# Patient Record
Sex: Female | Born: 1960
Health system: Southern US, Community
[De-identification: ages and names within clinical notes are randomized; demographics above are authoritative.]

## PROBLEM LIST (undated history)

## (undated) ENCOUNTER — Emergency Department (HOSPITAL_COMMUNITY): Admission: EM | Payer: 59 | Source: Home / Self Care

## (undated) DIAGNOSIS — I10 Essential (primary) hypertension: Secondary | ICD-10-CM

## (undated) DIAGNOSIS — E039 Hypothyroidism, unspecified: Secondary | ICD-10-CM

## (undated) DIAGNOSIS — D649 Anemia, unspecified: Secondary | ICD-10-CM

## (undated) DIAGNOSIS — J45909 Unspecified asthma, uncomplicated: Secondary | ICD-10-CM

## (undated) DIAGNOSIS — I499 Cardiac arrhythmia, unspecified: Secondary | ICD-10-CM

---

## 1989-03-08 HISTORY — PX: BREAST CYST EXCISION: SHX579

## 2001-11-29 ENCOUNTER — Encounter: Payer: Self-pay | Admitting: Obstetrics and Gynecology

## 2001-11-29 ENCOUNTER — Ambulatory Visit (HOSPITAL_COMMUNITY): Admission: RE | Admit: 2001-11-29 | Discharge: 2001-11-29 | Payer: Self-pay | Admitting: *Deleted

## 2002-12-24 ENCOUNTER — Ambulatory Visit (HOSPITAL_COMMUNITY): Admission: RE | Admit: 2002-12-24 | Discharge: 2002-12-24 | Payer: Self-pay | Admitting: Obstetrics and Gynecology

## 2002-12-24 ENCOUNTER — Encounter: Payer: Self-pay | Admitting: Obstetrics and Gynecology

## 2003-12-25 ENCOUNTER — Ambulatory Visit (HOSPITAL_COMMUNITY): Admission: RE | Admit: 2003-12-25 | Discharge: 2003-12-25 | Payer: Self-pay | Admitting: Obstetrics and Gynecology

## 2004-12-31 ENCOUNTER — Ambulatory Visit (HOSPITAL_COMMUNITY): Admission: RE | Admit: 2004-12-31 | Discharge: 2004-12-31 | Payer: Self-pay | Admitting: Obstetrics and Gynecology

## 2006-01-04 ENCOUNTER — Ambulatory Visit (HOSPITAL_COMMUNITY): Admission: RE | Admit: 2006-01-04 | Discharge: 2006-01-04 | Payer: Self-pay | Admitting: Family Medicine

## 2007-01-16 ENCOUNTER — Ambulatory Visit (HOSPITAL_COMMUNITY): Admission: RE | Admit: 2007-01-16 | Discharge: 2007-01-16 | Payer: Self-pay | Admitting: Family Medicine

## 2008-01-17 ENCOUNTER — Ambulatory Visit (HOSPITAL_COMMUNITY): Admission: RE | Admit: 2008-01-17 | Discharge: 2008-01-17 | Payer: Self-pay | Admitting: Family Medicine

## 2009-01-17 ENCOUNTER — Ambulatory Visit (HOSPITAL_COMMUNITY): Admission: RE | Admit: 2009-01-17 | Discharge: 2009-01-17 | Payer: Self-pay | Admitting: Family Medicine

## 2009-01-17 IMAGING — MG MM DIGITAL SCREENING
4 series · 4 of 4 positions shown · non-contrast
Comparison: none

DG SCREEN MAMMOGRAM BILATERAL
Bilateral CC and MLO view(s) were taken.
Technologist: [REDACTED]

DIGITAL SCREENING MAMMOGRAM WITH CAD:
The breast tissue is extremely dense.  A possible mass is noted in the left breast.  Spot 
compression views and possibly sonography are recommended for further evaluation.  In the right 
breast, no masses or malignant type calcifications are identified.  Compared with prior studies.
Images were processed with CAD.

[L CC]
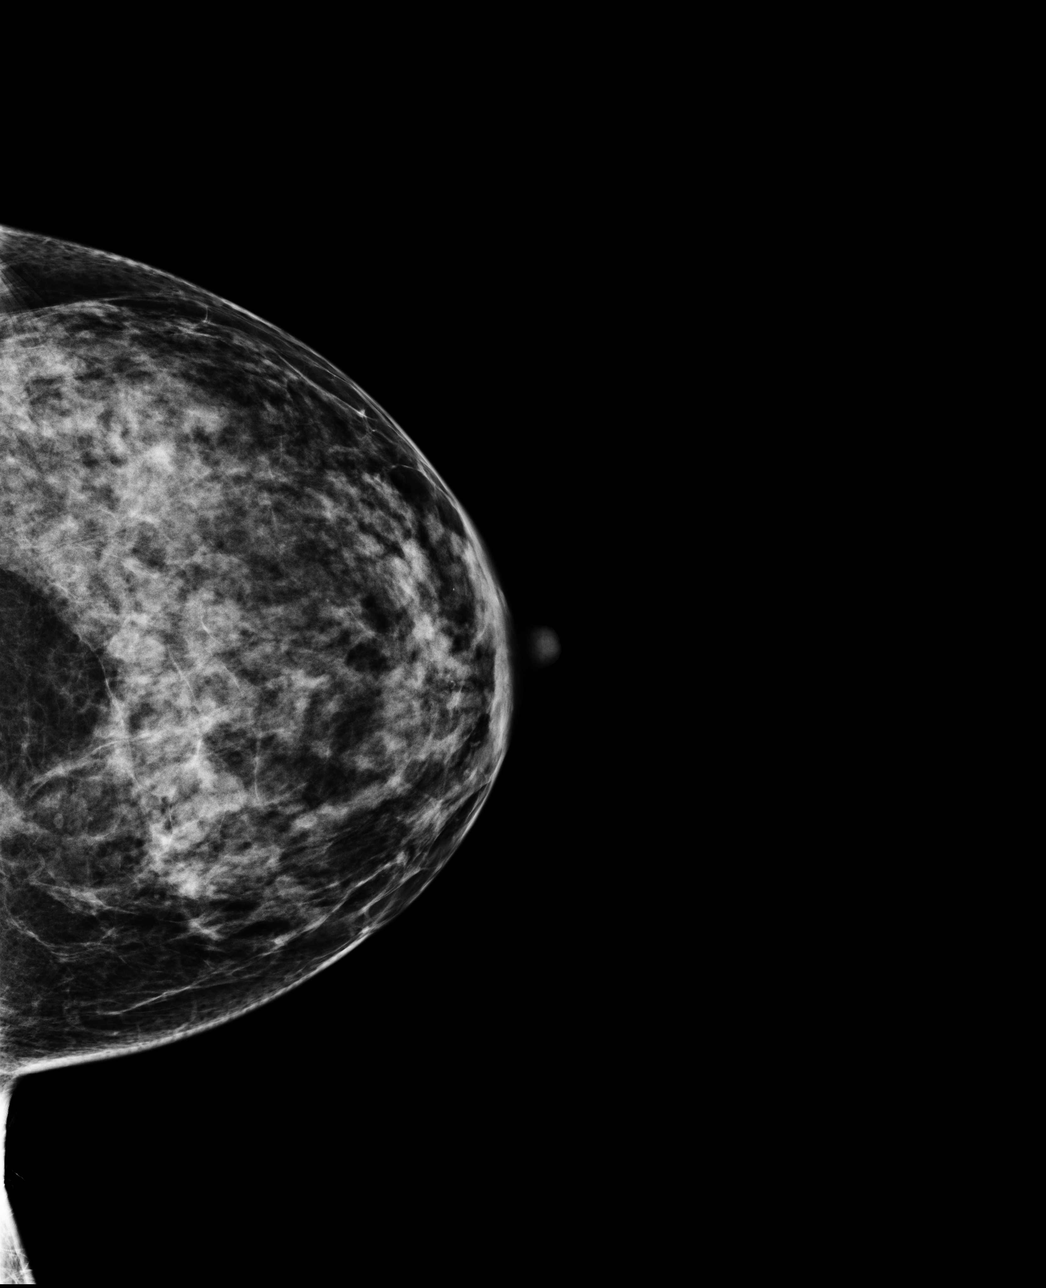

[L MLO]
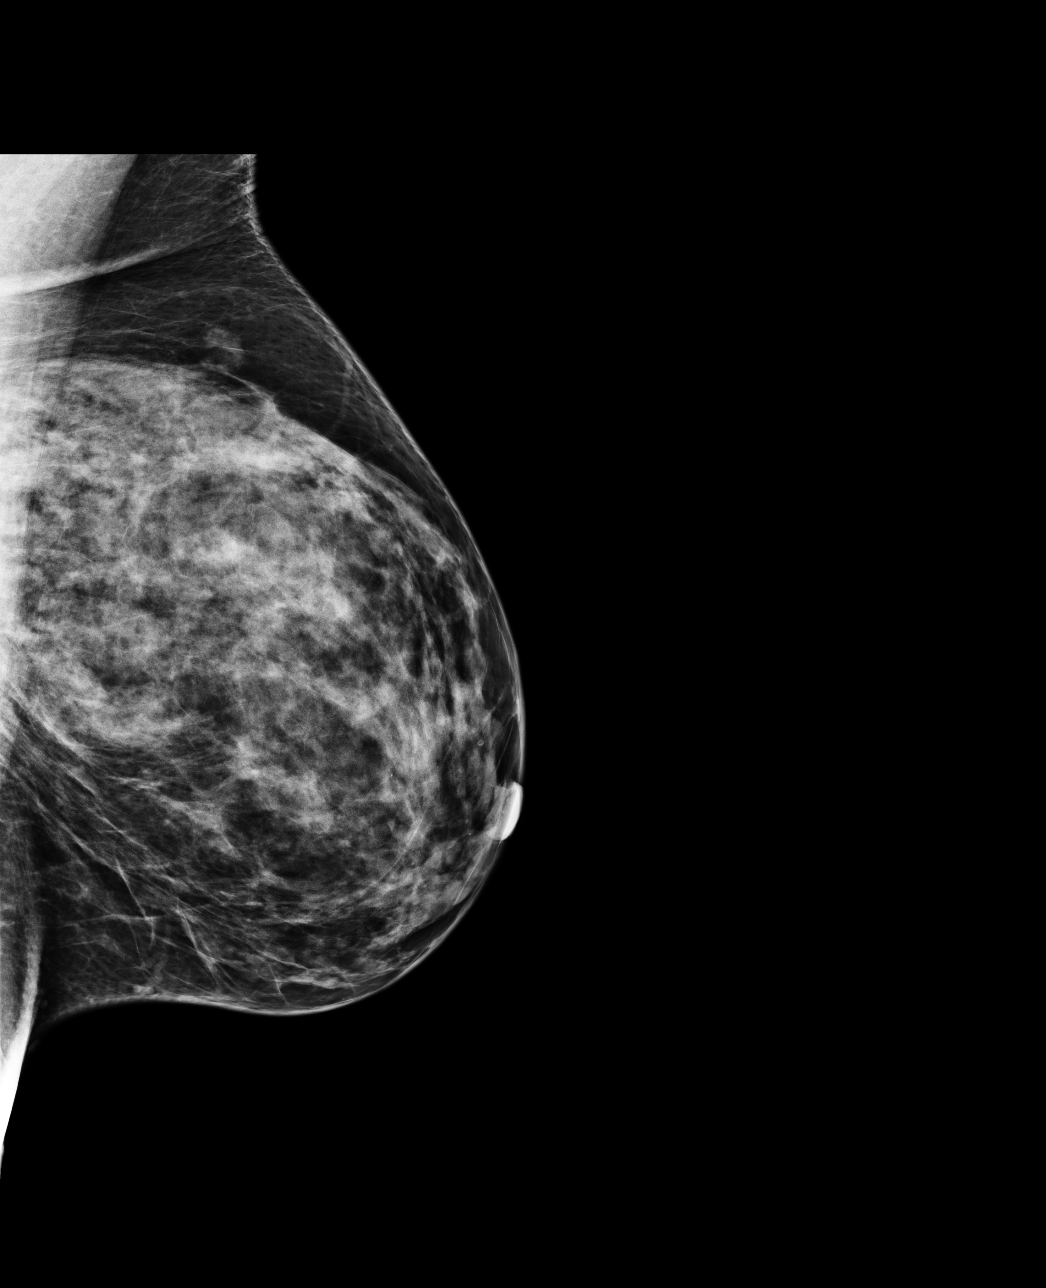

[R CC]
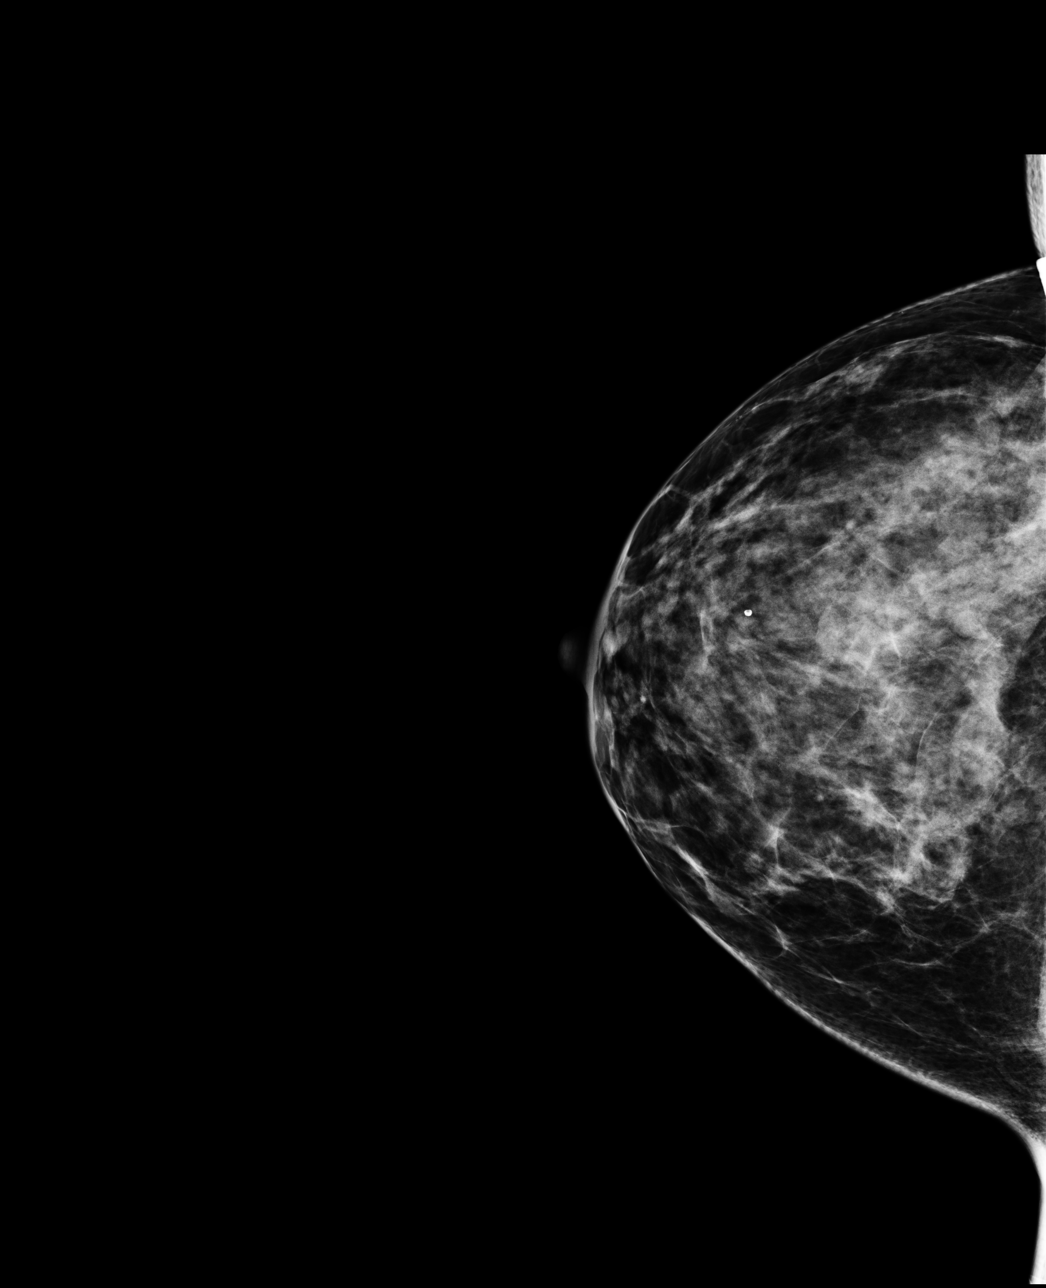

[R MLO]
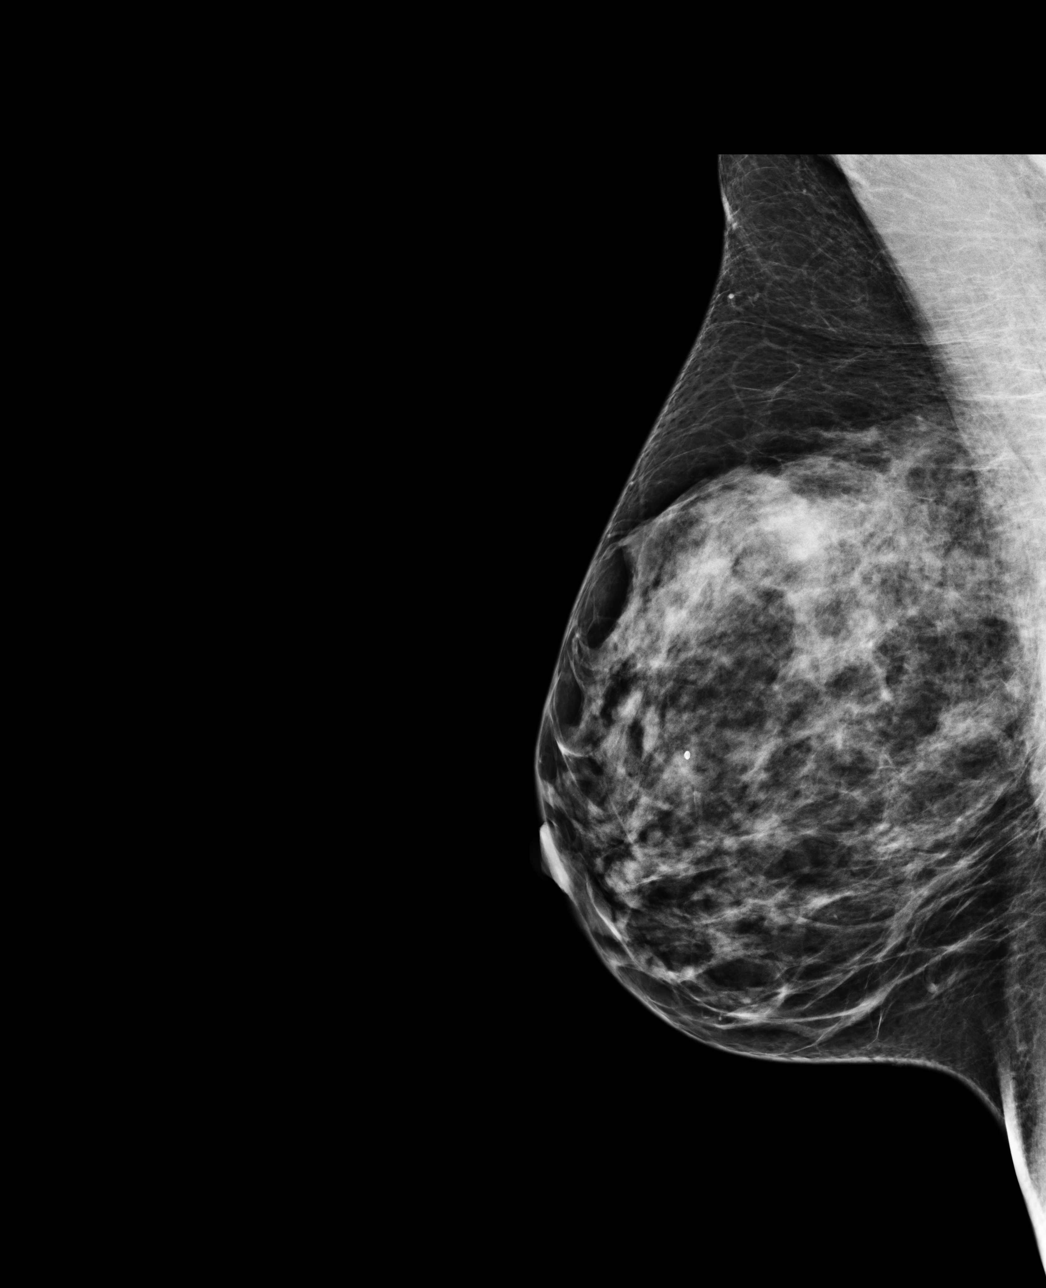

[4 of 4 positions shown; findings below may reference images not displayed]

IMPRESSION: Possible mass, left breast.  Additional evaluation is indicated.  The patient will be contacted for
additional studies and a supplementary report will follow.  No specific mammographic evidence of 
malignancy, right breast.

ASSESSMENT: Need additional imaging evaluation and/or prior mammograms for comparison - BI-RADS 0

Further imaging of the left breast.
,

## 2009-01-23 ENCOUNTER — Ambulatory Visit (HOSPITAL_COMMUNITY): Admission: RE | Admit: 2009-01-23 | Discharge: 2009-01-23 | Payer: Self-pay | Admitting: Family Medicine

## 2009-01-23 IMAGING — MG MM DIGITAL DIAGNOSTIC UNILAT L
3 series · 3 of 3 positions shown · non-contrast
Comparison: [DATE]

CLINICAL DATA: Abnormal screening mammogram

DIGITAL DIAGNOSTIC LEFT MAMMOGRAM

[L CC (1 of 2)]
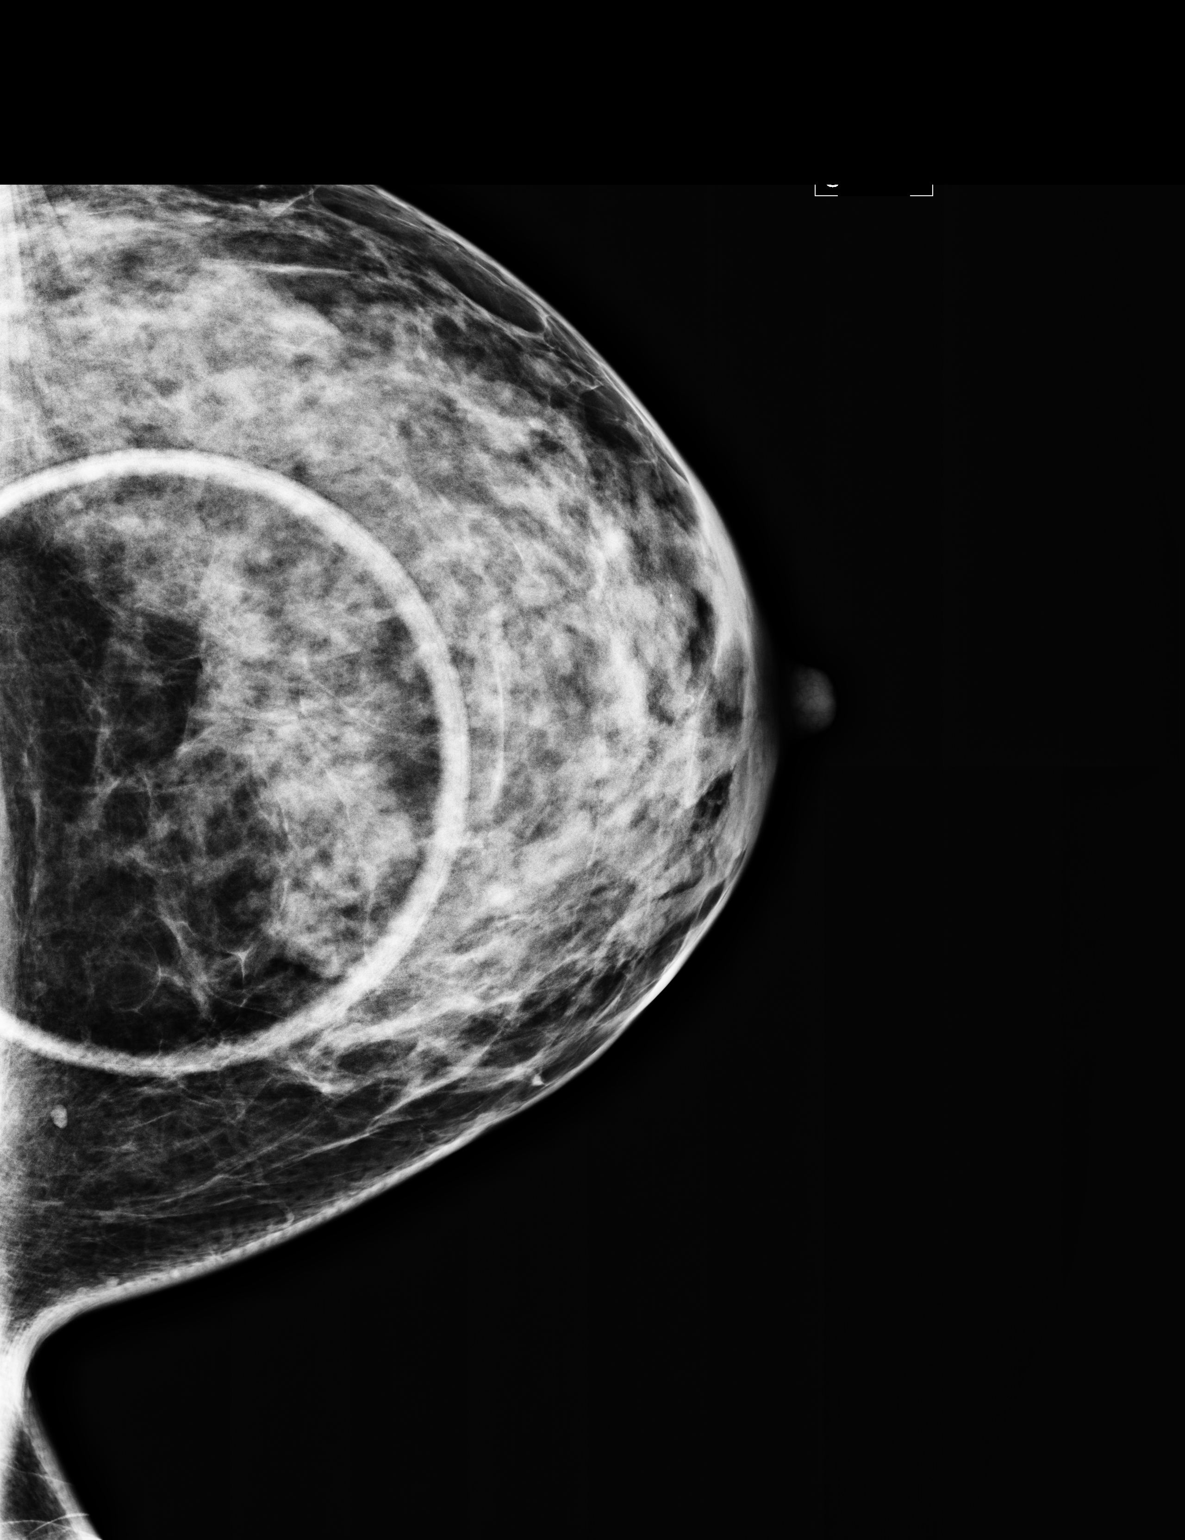

[L MLO]
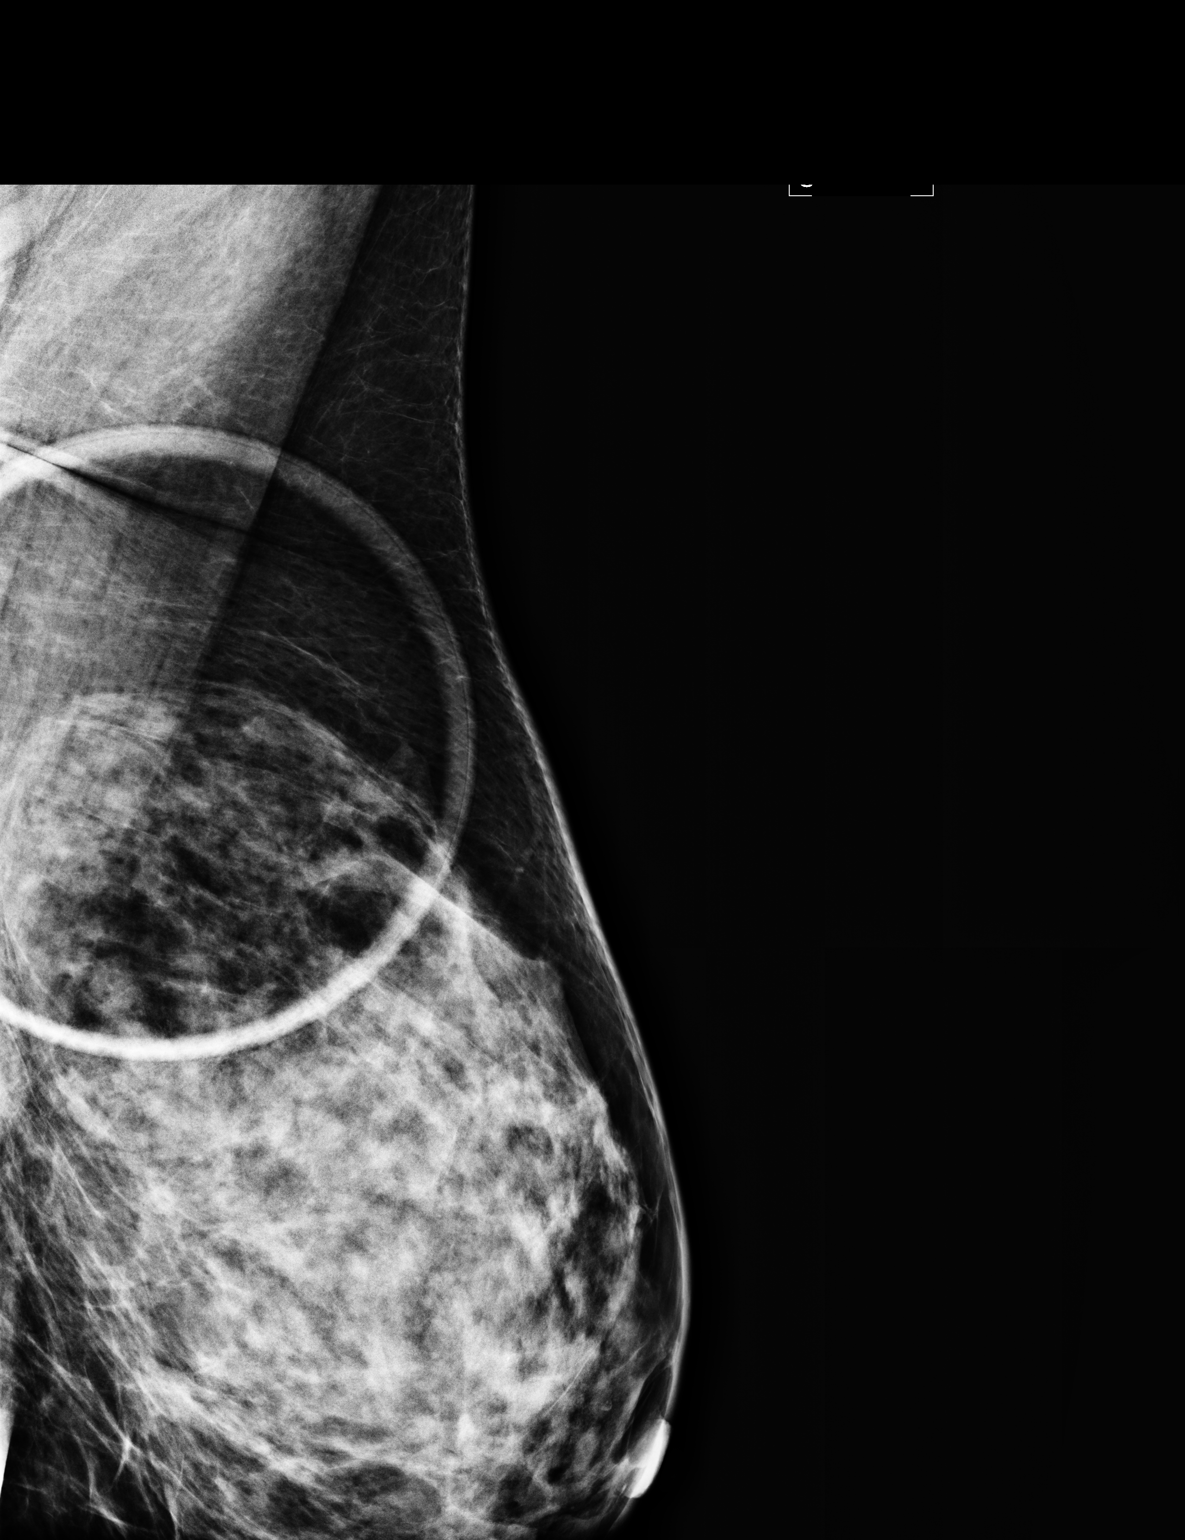

[L CC (2 of 2)]
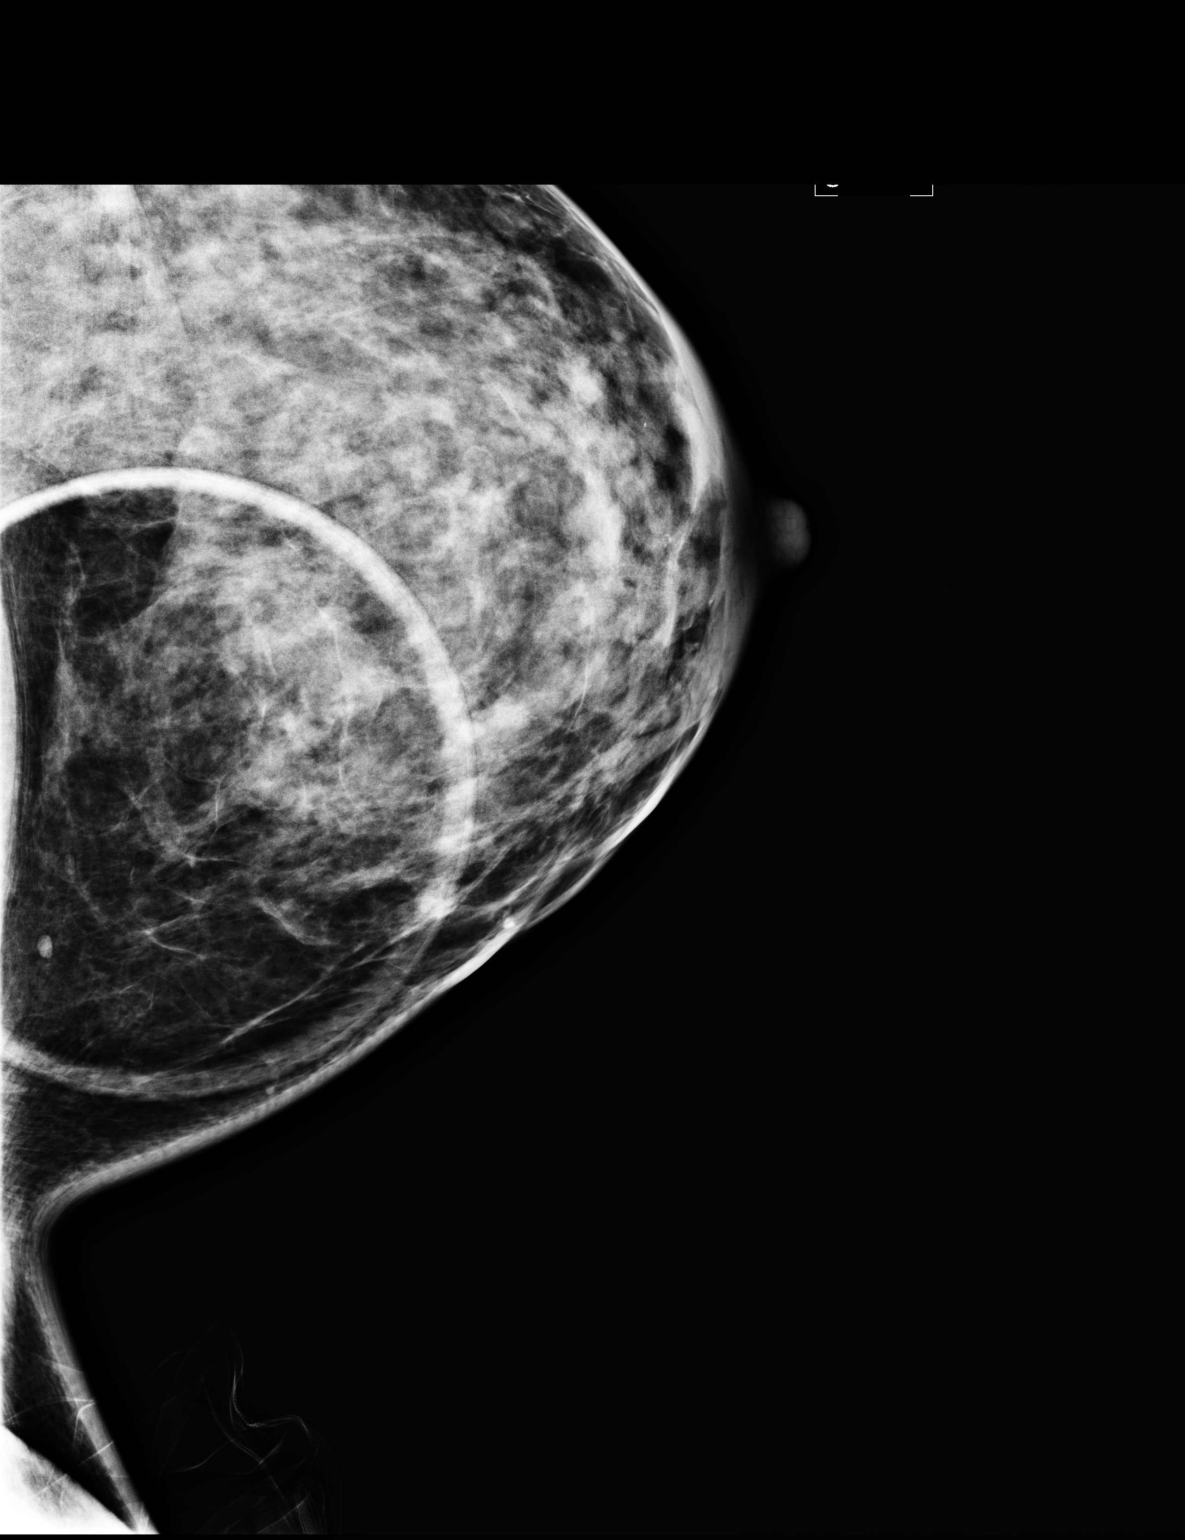

[3 of 3 positions shown; findings below may reference images not displayed]

FINDINGS: Additional views reveal no persistent mass or distortion
within the deep inner left breast.  The dense tissue pattern is
stable.
IMPRESSION: There is no radiographic evidence of malignancy on the left.
Bilateral screening mammogram in 1 year recommended.

BI-RADS CATEGORY 1:  Negative.

## 2010-04-15 ENCOUNTER — Other Ambulatory Visit (HOSPITAL_COMMUNITY): Payer: Self-pay | Admitting: Family Medicine

## 2010-04-15 DIAGNOSIS — Z139 Encounter for screening, unspecified: Secondary | ICD-10-CM

## 2010-04-16 ENCOUNTER — Ambulatory Visit (HOSPITAL_COMMUNITY)
Admission: RE | Admit: 2010-04-16 | Discharge: 2010-04-16 | Disposition: A | Discharge status: Home / Self Care | Payer: 59 | Source: Ambulatory Visit | Attending: Family Medicine | Admitting: Family Medicine

## 2010-04-16 ENCOUNTER — Other Ambulatory Visit (HOSPITAL_COMMUNITY): Payer: Self-pay | Admitting: Family Medicine

## 2010-04-16 DIAGNOSIS — Z1231 Encounter for screening mammogram for malignant neoplasm of breast: Secondary | ICD-10-CM | POA: Insufficient documentation

## 2010-04-16 DIAGNOSIS — N938 Other specified abnormal uterine and vaginal bleeding: Secondary | ICD-10-CM

## 2010-04-16 DIAGNOSIS — Z139 Encounter for screening, unspecified: Secondary | ICD-10-CM

## 2010-04-16 IMAGING — MG MM DIGITAL SCREENING {[facility]}
4 series · 4 of 4 positions shown · non-contrast
Comparison: none

DG SCREEN MAMMOGRAM BILATERAL
Bilateral CC and MLO view(s) were taken.

DIGITAL SCREENING MAMMOGRAM WITH CAD:
The breast tissue is extremely dense.  No masses or malignant type calcifications are identified.  
Compared with prior studies.
Images were processed with CAD.

[L CC]
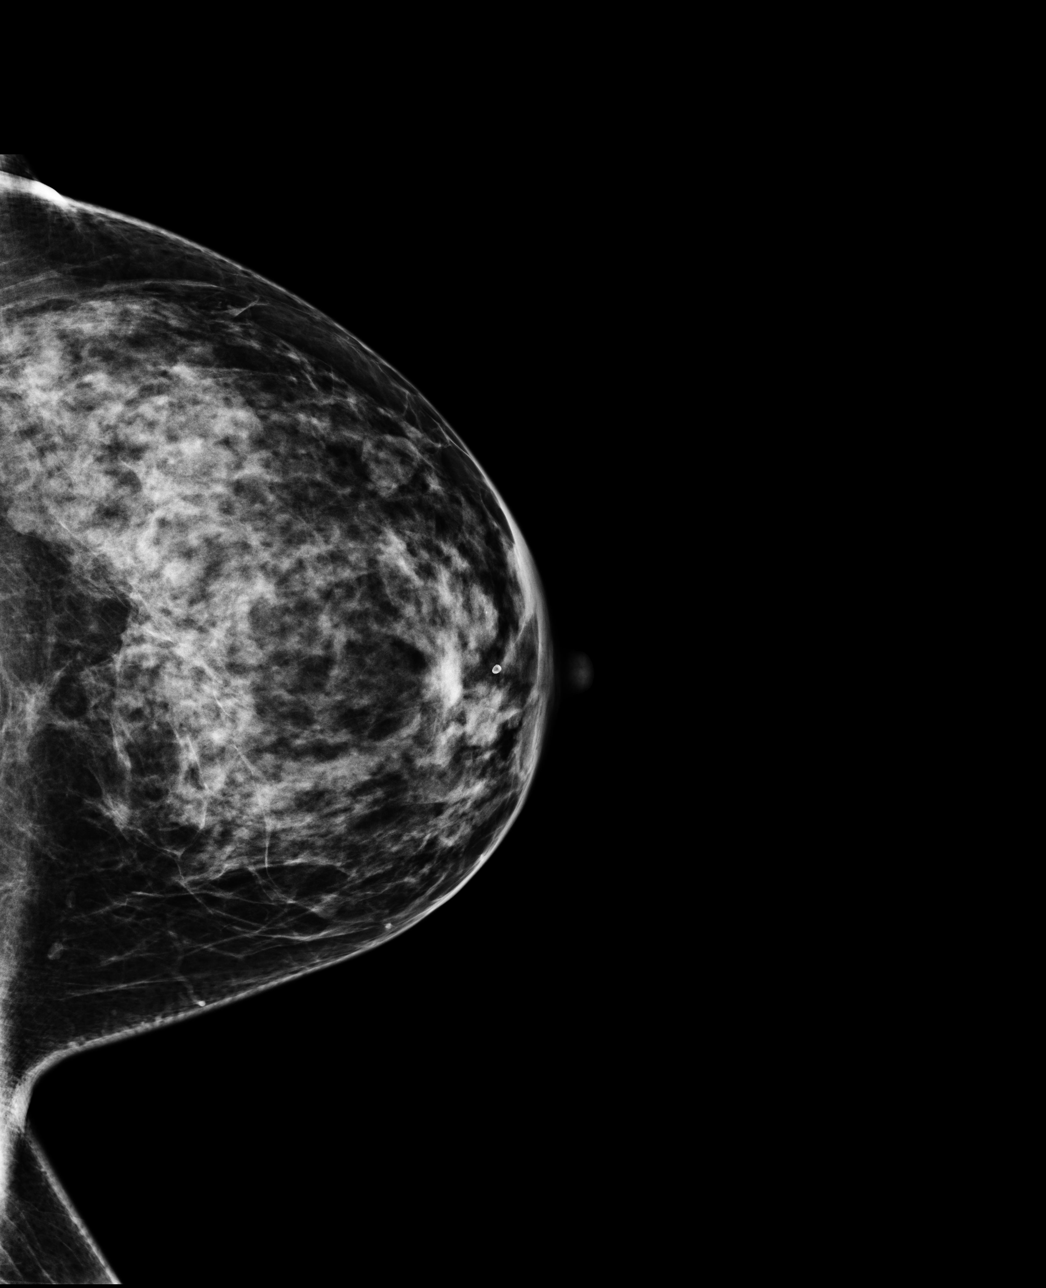

[L MLO]
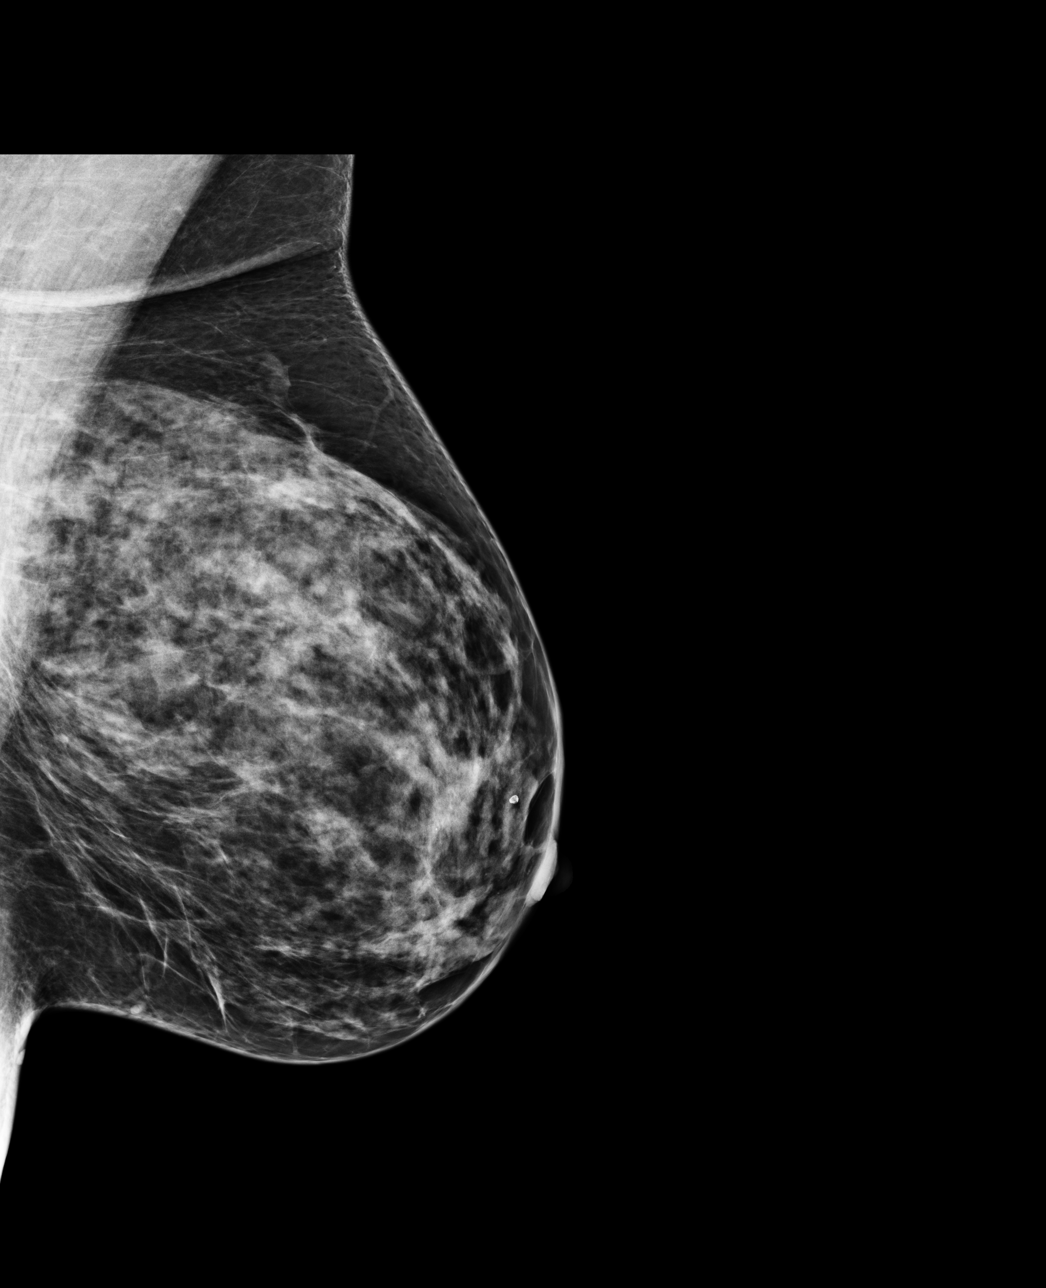

[R CC]
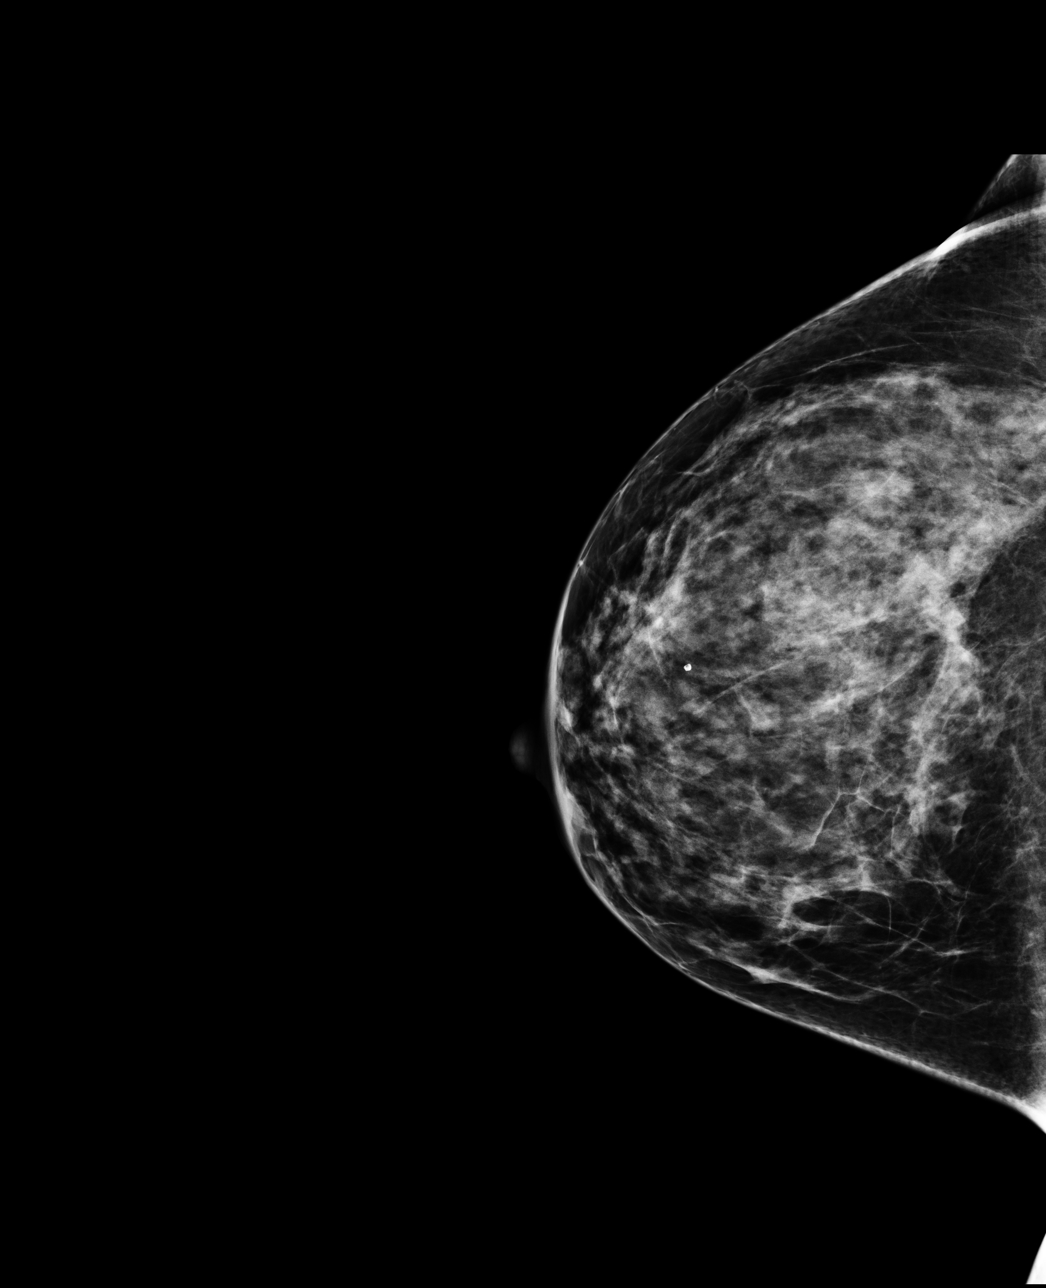

[R MLO]
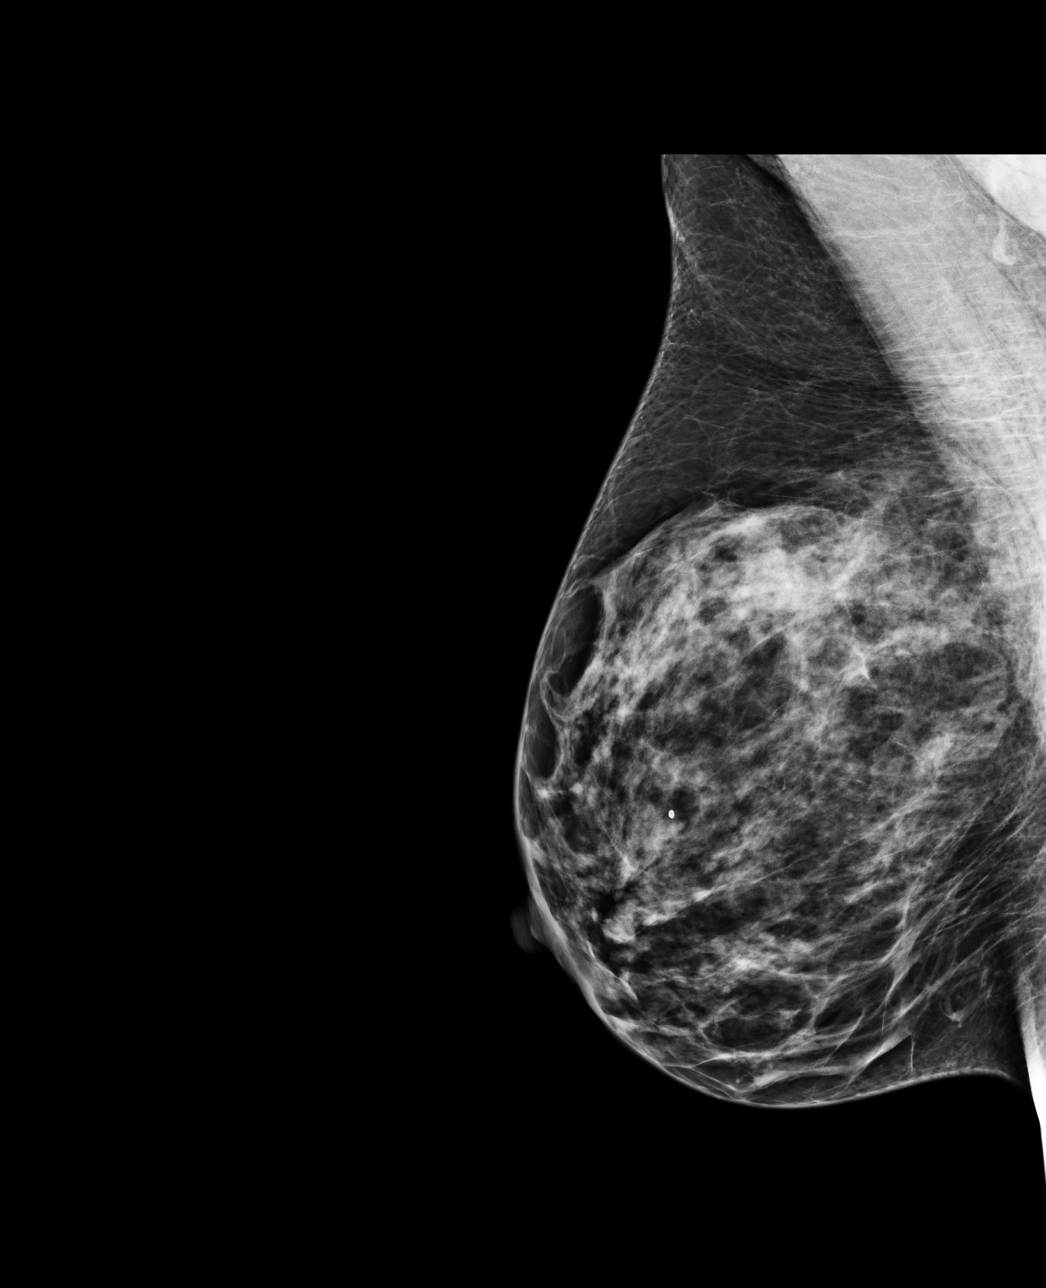

[4 of 4 positions shown; findings below may reference images not displayed]

IMPRESSION: No specific mammographic evidence of malignancy.  Next screening mammogram is recommended in one 
year.

A result letter of this screening mammogram will be mailed directly to the patient.

ASSESSMENT: Negative - BI-RADS 1

Screening mammogram in 1 year.
,

## 2010-04-22 ENCOUNTER — Ambulatory Visit (HOSPITAL_COMMUNITY)
Admission: RE | Admit: 2010-04-22 | Discharge: 2010-04-22 | Disposition: A | Discharge status: Home / Self Care | Payer: 59 | Source: Ambulatory Visit | Attending: Family Medicine | Admitting: Family Medicine

## 2010-04-22 DIAGNOSIS — N949 Unspecified condition associated with female genital organs and menstrual cycle: Secondary | ICD-10-CM | POA: Insufficient documentation

## 2010-04-22 DIAGNOSIS — N72 Inflammatory disease of cervix uteri: Secondary | ICD-10-CM | POA: Insufficient documentation

## 2010-04-22 DIAGNOSIS — N938 Other specified abnormal uterine and vaginal bleeding: Secondary | ICD-10-CM | POA: Insufficient documentation

## 2010-04-22 IMAGING — US US PELV - US TRANSVAGINAL
1 series · 14 of 25 positions shown · non-contrast
Comparison: None.

CLINICAL DATA: History of dysfunctional uterine bleeding.  LMP
[DATE].

TRANSABDOMINAL ULTRASOUND OF PELVIS
TECHNIQUE: Transabdominal ultrasound examination of the pelvis was
performed including evaluation of the uterus, ovaries, adnexal
regions, and pelvic cul-de-sac.

[Series 1: us pelv - us transvaginal · 0.26mm/px · 14 of 62 slices shown]
[im 1/62]
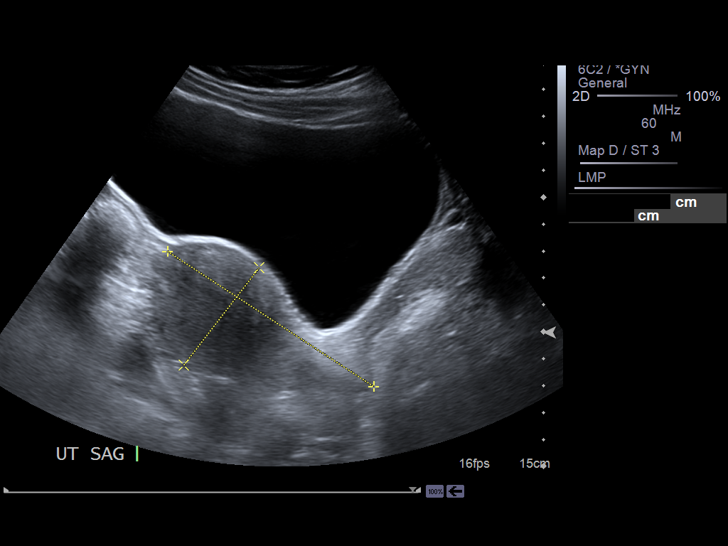
[im 6/62]
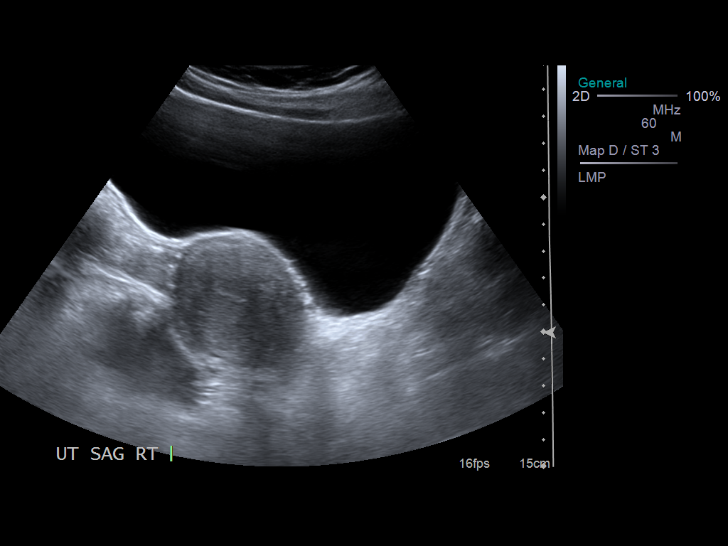
[im 11/62]
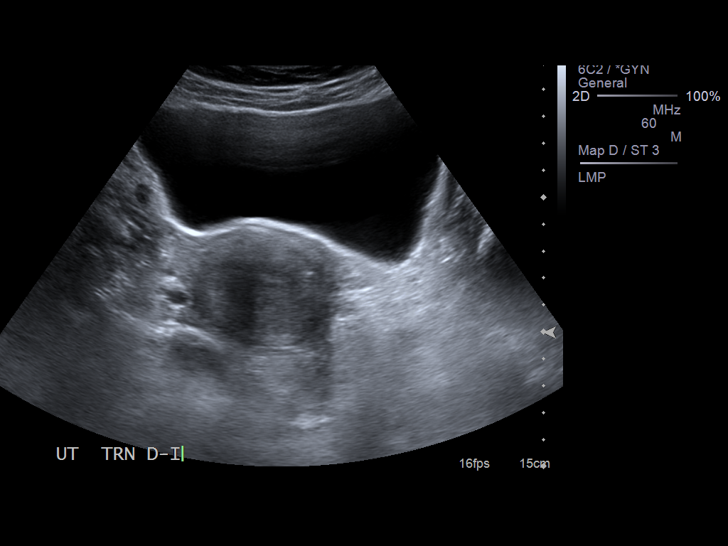
[im 16/62]
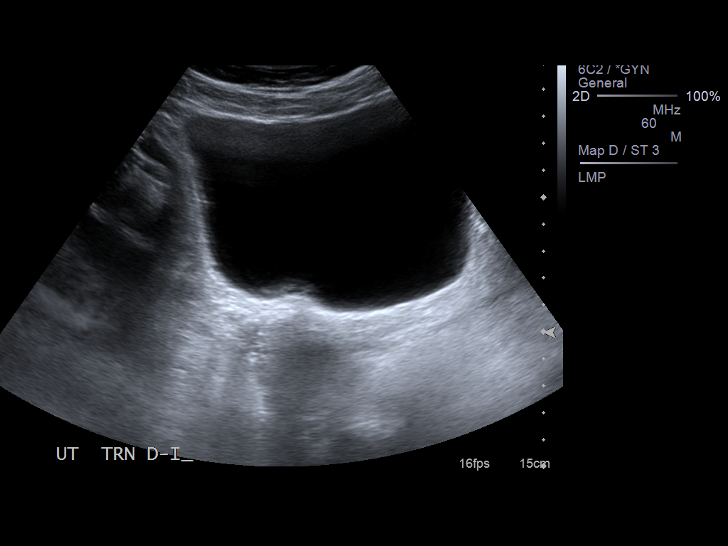
[im 21/62]
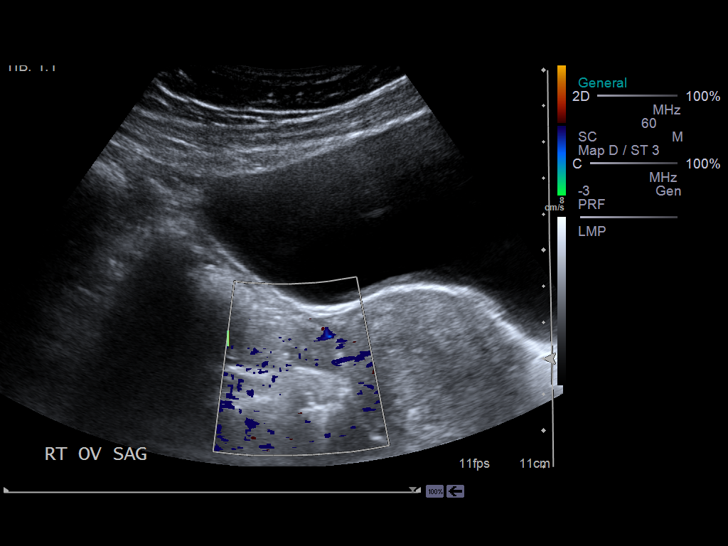
[im 23/62]
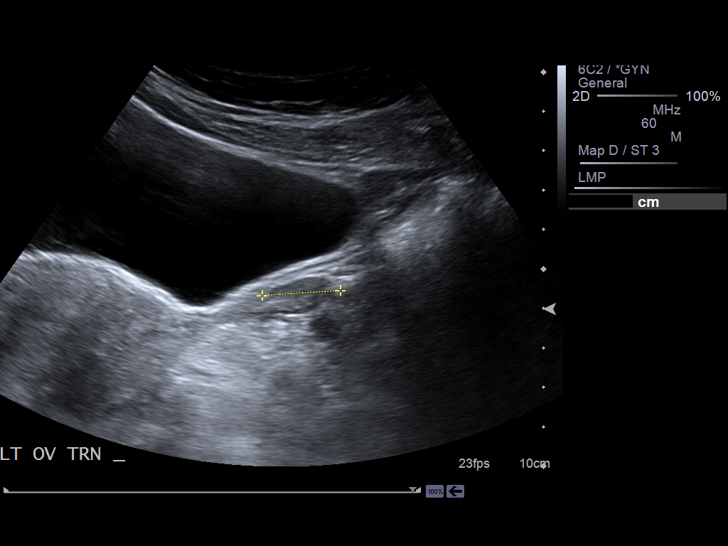
[im 28/62]
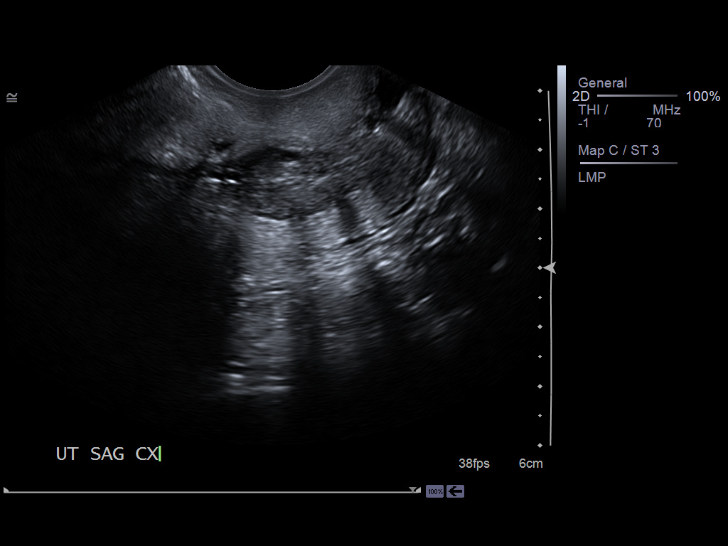
[im 34/62]
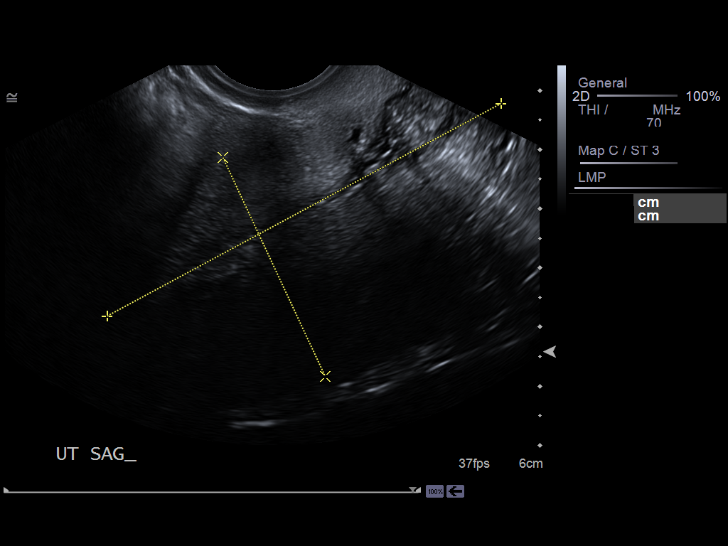
[im 39/62]
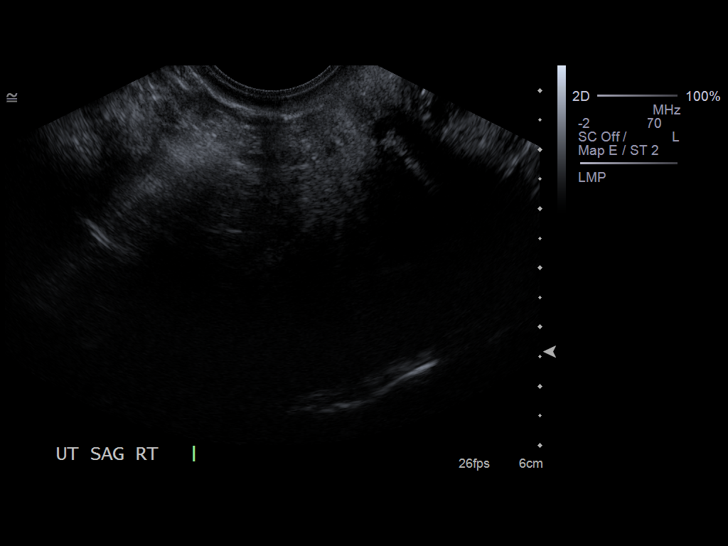
[im 41/62]
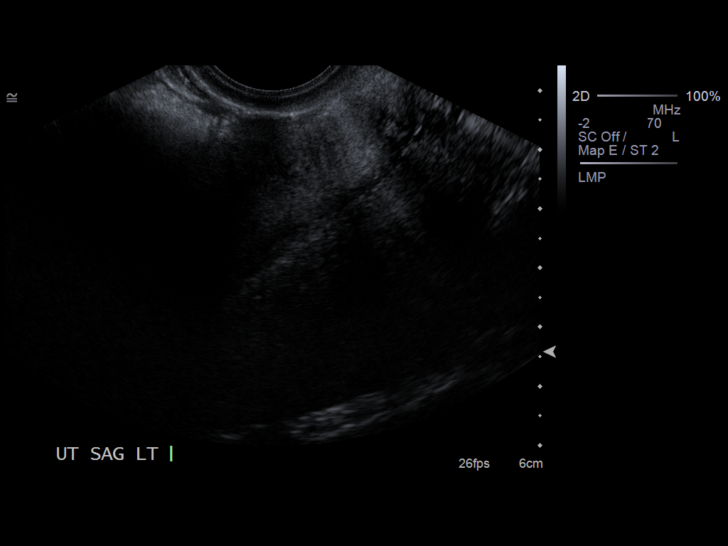
[im 46/62]
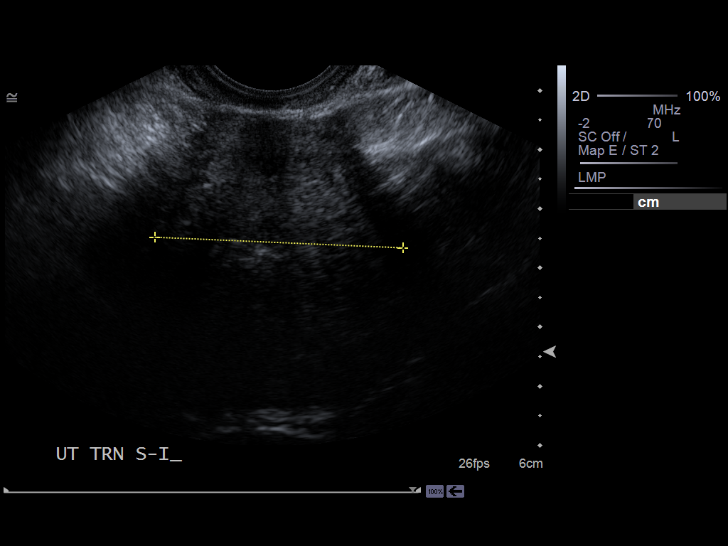
[im 51/62]
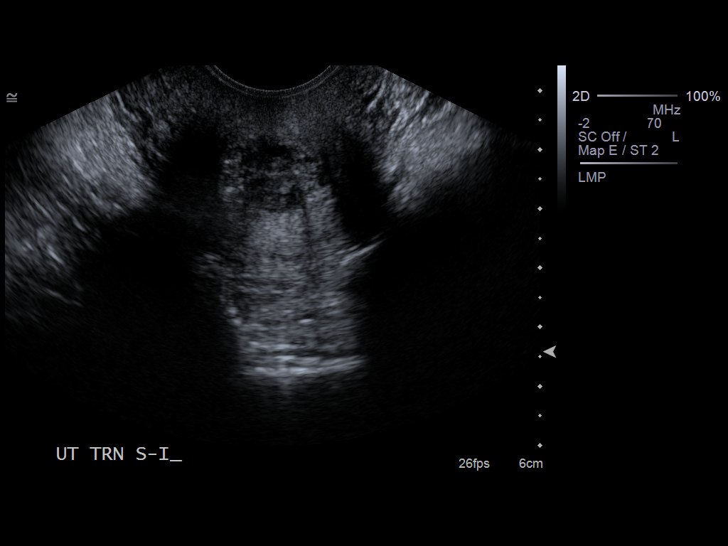
[im 56/62]
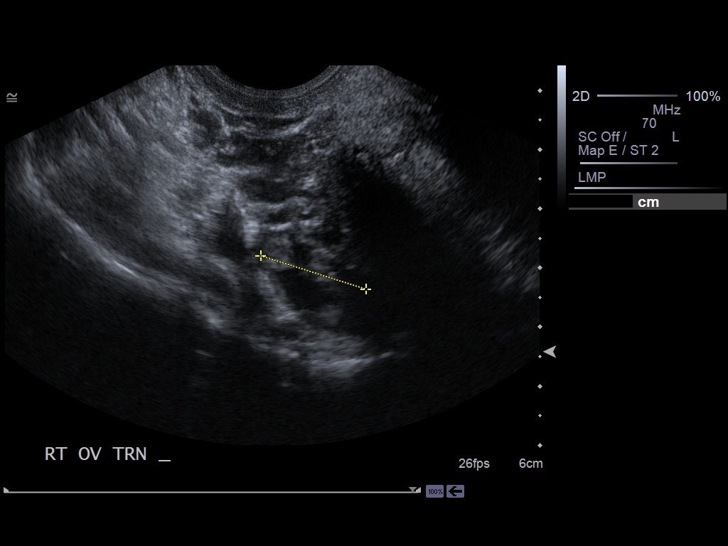
[im 62/62]
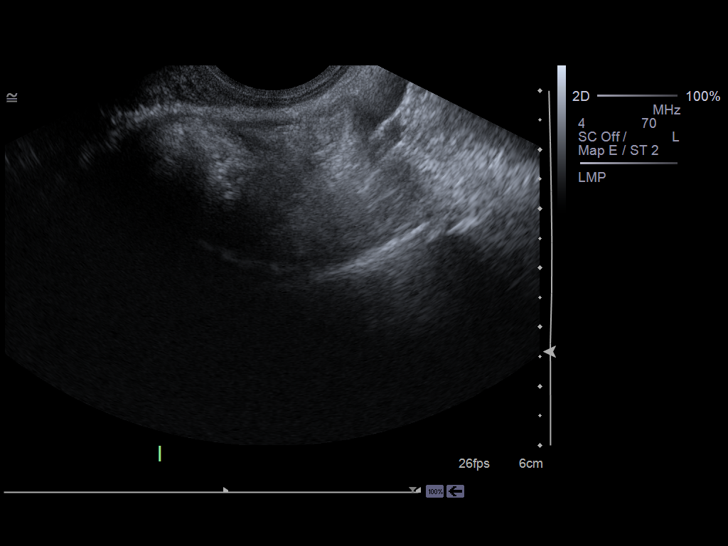

[14 of 25 positions shown; findings below may reference images not displayed]

FINDINGS: Uterus measures  9.2 x 4.6 x 6.1 cm.  There is a Nabothian cyst in
the cervix.  No myometrial abnormality is evident.

Endometrium thickness measures 0.7 cm.  No endometrial mass or
fluid collection or abnormality is evident.

Right Ovary measures 2.4 x 2.7 x 1.6 cm.  No right ovarian lesion
was seen.

Left Ovary measures 2.5 x 1.3 x 2.0 cm.  No ovarian lesion was
seen.

Other Findings:  No free fluid is seen the pelvis.  No tubal
abnormality is evident.
IMPRESSION: Prominent Nabothian cysts in the cervix.  No myometrial or
endometrial abnormality or ovarian lesion is seen.

## 2010-12-31 ENCOUNTER — Telehealth: Payer: Self-pay

## 2010-12-31 ENCOUNTER — Other Ambulatory Visit: Payer: Self-pay

## 2010-12-31 DIAGNOSIS — Z139 Encounter for screening, unspecified: Secondary | ICD-10-CM

## 2010-12-31 NOTE — Telephone Encounter (Signed)
Gastroenterology Pre-Procedure Form  Request Date:   12/31/2010       Requesting Physician: Dr. Lubertha South     PATIENT INFORMATION:  Amy Shelton is a 50 y.o., female (DOB=Jul 23, 1960).  PROCEDURE: Procedure(s) requested: colonoscopy Procedure Reason: screening for colon cancer  PATIENT REVIEW QUESTIONS: The patient reports the following:   1. Diabetes Melitis: no 2. Joint replacements in the past 12 months: no 3. Major health problems in the past 3 months: no 4. Has an artificial valve or MVP:no 5. Has been advised in past to take antibiotics in advance of a procedure like teeth cleaning: no}    MEDICATIONS & ALLERGIES:    Patient reports the following regarding taking any blood thinners:   Plavix? no Aspirin?no Coumadin?  no  Patient confirms/reports the following medications:  Current Outpatient Prescriptions  Medication Sig Dispense Refill  . cholecalciferol (VITAMIN D) 1000 UNITS tablet Take 1,000 Units by mouth daily.        Marland Kitchen levothyroxine (SYNTHROID, LEVOTHROID) 50 MCG tablet Take 50 mcg by mouth daily.        . Multiple Vitamin (MULTIVITAMIN) tablet Take 1 tablet by mouth daily. WITH IRON AND CALCIUM       . NON FORMULARY Iron    28mg  daily       . NON FORMULARY CALCIUM 1200 MG PLUS D         Patient confirms/reports the following allergies:  No Known Allergies  Patient is appropriate to schedule for requested procedure(s): yes  AUTHORIZATION INFORMATION Primary Insurance:   ID #:   Group #:  Pre-Cert / Auth required:  Pre-Cert / Auth #:   Secondary Insurance: ID #:   Group #:  Pre-Cert / Auth required:  Pre-Cert / Auth #:   No orders of the defined types were placed in this encounter.    SCHEDULE INFORMATION: Procedure has been scheduled as follows:  Date: 02/05/2011       Time: 9:30 AM  Location: [facility] Short Stay  This Gastroenterology Pre-Precedure Form is being routed to the following provider(s) for review: Jonette Eva, MD

## 2011-01-04 NOTE — Telephone Encounter (Signed)
MOVIPREP SPLIT PREP OR PREPOPIK SAMPLE SPLIT DOSING

## 2011-01-08 NOTE — Telephone Encounter (Signed)
Rx and instructions mailed.  

## 2011-01-20 ENCOUNTER — Telehealth: Payer: Self-pay

## 2011-01-20 NOTE — Telephone Encounter (Signed)
LMOM for pt to call and update info prior to colonoscopy on 02/05/2011.

## 2011-01-20 NOTE — Telephone Encounter (Signed)
Pt returned call to update triage.  Gastroenterology Pre-Procedure Form  Request Date: 01/20/2011     Requesting Physician: Simone Curia     PATIENT INFORMATION:  Amy Shelton is a 50 y.o., female (DOB=11/27/1960).  PROCEDURE: Procedure(s) requested: colonoscopy Procedure Reason: screening for colon cancer  PATIENT REVIEW QUESTIONS: The patient reports the following:   1. Diabetes Melitis: no 2. Joint replacements in the past 12 months: no 3. Major health problems in the past 3 months: no 4. Has an artificial valve or MVP:no 5. Has been advised in past to take antibiotics in advance of a procedure like teeth cleaning: no}    MEDICATIONS & ALLERGIES:    Patient reports the following regarding taking any blood thinners:   Plavix? no Aspirin?no Coumadin?  no  Patient confirms/reports the following medications:  Current Outpatient Prescriptions  Medication Sig Dispense Refill  . cholecalciferol (VITAMIN D) 1000 UNITS tablet Take 1,000 Units by mouth daily.        Marland Kitchen levothyroxine (SYNTHROID, LEVOTHROID) 50 MCG tablet Take 50 mcg by mouth daily.        . Multiple Vitamin (MULTIVITAMIN) tablet Take 1 tablet by mouth daily. WITH IRON AND CALCIUM       . NON FORMULARY Iron    28mg  daily       . NON FORMULARY CALCIUM 1200 MG PLUS D         Patient confirms/reports the following allergies:  No Known Allergies  Patient is appropriate to schedule for requested procedure(s): yes  AUTHORIZATION INFORMATION Primary Insurance: ID #: Group #:  Pre-Cert / Auth required Pre-Cert / Auth #:   Secondary Insurance:   ID #: Group #:  Pre-Cert / Auth required: Pre-Cert / Auth #:   No orders of the defined types were placed in this encounter.    SCHEDULE INFORMATION: Procedure has been scheduled as follows:  Date: 02/05/2011      Time: 9:30 AM  Location: [facility] Short Stay  This Gastroenterology Pre-Precedure Form is being routed to the following provider(s) for review:  Jonette Eva, MD

## 2011-01-22 ENCOUNTER — Encounter (HOSPITAL_COMMUNITY): Payer: Self-pay | Admitting: Pharmacy Technician

## 2011-01-25 NOTE — Telephone Encounter (Signed)
MOVI-PREP SPLIT DOSING. PT MAY HAVE HER USUAL BREAKFAST. CLEAR LIQUIDS AFTER 9 AM.

## 2011-01-26 NOTE — Telephone Encounter (Signed)
Informed pt she can have breakfast and start clear liquids at 9:00 AM.

## 2011-02-04 MED ORDER — SODIUM CHLORIDE 0.45 % IV SOLN
Freq: Once | INTRAVENOUS | Status: AC
  Administered 2011-02-05: 09:00:00 via INTRAVENOUS

## 2011-02-05 ENCOUNTER — Encounter (HOSPITAL_COMMUNITY): Payer: Self-pay | Admitting: *Deleted

## 2011-02-05 ENCOUNTER — Encounter (HOSPITAL_COMMUNITY)
Admission: RE | Disposition: A | Discharge status: Home / Self Care | Payer: Self-pay | Source: Ambulatory Visit | Attending: Gastroenterology

## 2011-02-05 ENCOUNTER — Ambulatory Visit (HOSPITAL_COMMUNITY)
Admission: RE | Admit: 2011-02-05 | Discharge: 2011-02-05 | Disposition: A | Discharge status: Home / Self Care | Payer: 59 | Source: Ambulatory Visit | Attending: Gastroenterology | Admitting: Gastroenterology

## 2011-02-05 DIAGNOSIS — Z139 Encounter for screening, unspecified: Secondary | ICD-10-CM

## 2011-02-05 DIAGNOSIS — K648 Other hemorrhoids: Secondary | ICD-10-CM

## 2011-02-05 DIAGNOSIS — Z1211 Encounter for screening for malignant neoplasm of colon: Secondary | ICD-10-CM

## 2011-02-05 HISTORY — DX: Hypothyroidism, unspecified: E03.9

## 2011-02-05 HISTORY — PX: COLONOSCOPY: SHX5424

## 2011-02-05 HISTORY — DX: Anemia, unspecified: D64.9

## 2011-02-05 SURGERY — COLONOSCOPY
Anesthesia: Moderate Sedation

## 2011-02-05 MED ORDER — MEPERIDINE HCL 100 MG/ML IJ SOLN
INTRAMUSCULAR | Status: DC | PRN
  Administered 2011-02-05: 50 mg via INTRAVENOUS
  Administered 2011-02-05: 25 mg via INTRAVENOUS

## 2011-02-05 MED ORDER — MIDAZOLAM HCL 5 MG/5ML IJ SOLN
INTRAMUSCULAR | Status: DC | PRN
  Administered 2011-02-05: 2 mg via INTRAVENOUS
  Administered 2011-02-05: 1 mg via INTRAVENOUS
  Administered 2011-02-05: 2 mg via INTRAVENOUS

## 2011-02-05 MED ORDER — MIDAZOLAM HCL 5 MG/5ML IJ SOLN
INTRAMUSCULAR | Status: AC
  Filled 2011-02-05: qty 10

## 2011-02-05 MED ORDER — STERILE WATER FOR IRRIGATION IR SOLN
Status: DC | PRN
  Administered 2011-02-05: 10:00:00

## 2011-02-05 MED ORDER — MEPERIDINE HCL 100 MG/ML IJ SOLN
INTRAMUSCULAR | Status: AC
  Filled 2011-02-05: qty 2

## 2011-02-05 NOTE — H&P (Signed)
  Primary Care Physician:  Harlow Asa, MD, MD Primary Gastroenterologist:  Dr. Darrick Penna  Pre-Procedure History & Physical: HPI:  Amy Shelton is a 50 y.o. female here for COLON CANCER SCREENING.   Past Medical History  Diagnosis Date  . Anemia     related to heavy vaginal bleeding-has resolved  . Hypothyroidism     History reviewed. No pertinent past surgical history.  Prior to Admission medications   Medication Sig Start Date End Date Taking? Authorizing Provider  cholecalciferol (VITAMIN D) 1000 UNITS tablet Take 1,000 Units by mouth daily.     Yes Historical Provider, MD  levothyroxine (SYNTHROID, LEVOTHROID) 50 MCG tablet Take 50 mcg by mouth daily.     Yes Historical Provider, MD  NON FORMULARY CALCIUM 1200 MG PLUS D    Yes Historical Provider, MD  Multiple Vitamin (MULTIVITAMIN) tablet Take 1 tablet by mouth daily. WITH IRON AND VITAMIN D    Historical Provider, MD  NON FORMULARY Iron    28mg  daily     Historical Provider, MD    Allergies as of 12/31/2010  . (No Known Allergies)    Family History  Problem Relation Age of Onset  . Colon cancer Maternal Aunt   NO POLYPS  History   Social History  . Marital Status: Married    Spouse Name: N/A    Number of Children: N/A  . Years of Education: N/A   Occupational History  . Not on file.   Social History Main Topics  . Smoking status: Never Smoker   . Smokeless tobacco: Not on file  . Alcohol Use: 4.2 oz/week    7 Glasses of wine per week  . Drug Use: No  . Sexually Active:    Other Topics Concern  . Not on file   Social History Narrative  . No narrative on file    Review of Systems: See HPI, otherwise negative ROS   Physical Exam: BP 153/76  Pulse 50  Temp(Src) 97.7 F (36.5 C) (Oral)  Resp 16  Ht 5' 4.5" (1.638 m)  Wt 155 lb (70.308 kg)  BMI 26.19 kg/m2  SpO2 97%  LMP 01/05/2011 General:   Alert,  pleasant and cooperative in NAD Head:  Normocephalic and atraumatic. Neck:  Supple; no masses  or thyromegaly. Lungs:  Clear throughout to auscultation.    Heart:  Regular rate and rhythm. Abdomen:  Soft, nontender and nondistended. Normal bowel sounds, without guarding, and without rebound.   Neurologic:  Alert and  oriented x4;  grossly normal neurologically.  Impression/Plan:    AVERAGE RISK  PLAN: TCS TODAY

## 2011-02-15 ENCOUNTER — Encounter (HOSPITAL_COMMUNITY): Payer: Self-pay | Admitting: Gastroenterology

## 2011-05-05 ENCOUNTER — Other Ambulatory Visit: Payer: Self-pay | Admitting: Family Medicine

## 2011-05-05 DIAGNOSIS — Z139 Encounter for screening, unspecified: Secondary | ICD-10-CM

## 2011-05-07 ENCOUNTER — Ambulatory Visit (HOSPITAL_COMMUNITY)
Admission: RE | Admit: 2011-05-07 | Discharge: 2011-05-07 | Disposition: A | Discharge status: Home / Self Care | Payer: 59 | Source: Ambulatory Visit | Attending: Family Medicine | Admitting: Family Medicine

## 2011-05-07 DIAGNOSIS — Z139 Encounter for screening, unspecified: Secondary | ICD-10-CM

## 2011-05-07 DIAGNOSIS — Z1231 Encounter for screening mammogram for malignant neoplasm of breast: Secondary | ICD-10-CM | POA: Insufficient documentation

## 2011-05-07 IMAGING — MG MM DIGITAL SCREENING BILAT
3 series · 3 of 3 positions shown · non-contrast
Comparison: none

DG SCREEN MAMMOGRAM BILATERAL
Bilateral CC and MLO view(s) were taken.

DIGITAL SCREENING MAMMOGRAM WITH CAD:
The breast tissue is heterogeneously dense.  No masses or malignant type calcifications are 
identified.  Compared with prior studies.
Images were processed with CAD.

[L CC]
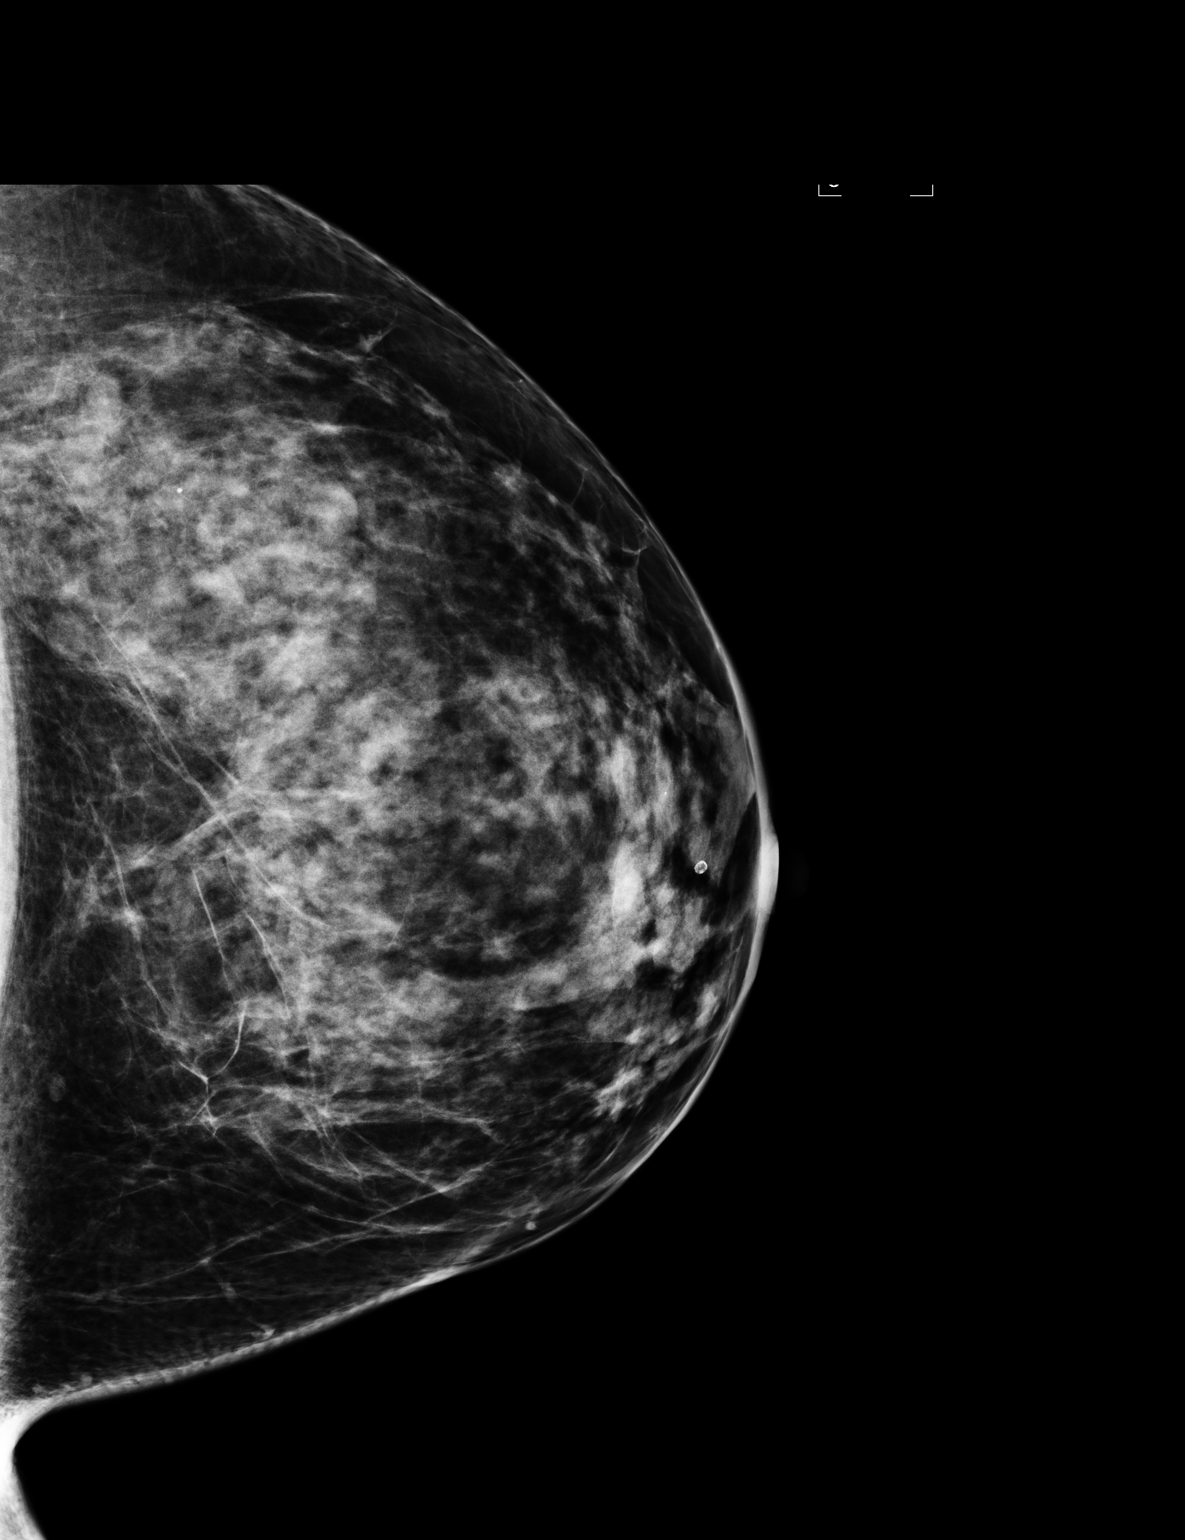

[L MLO]
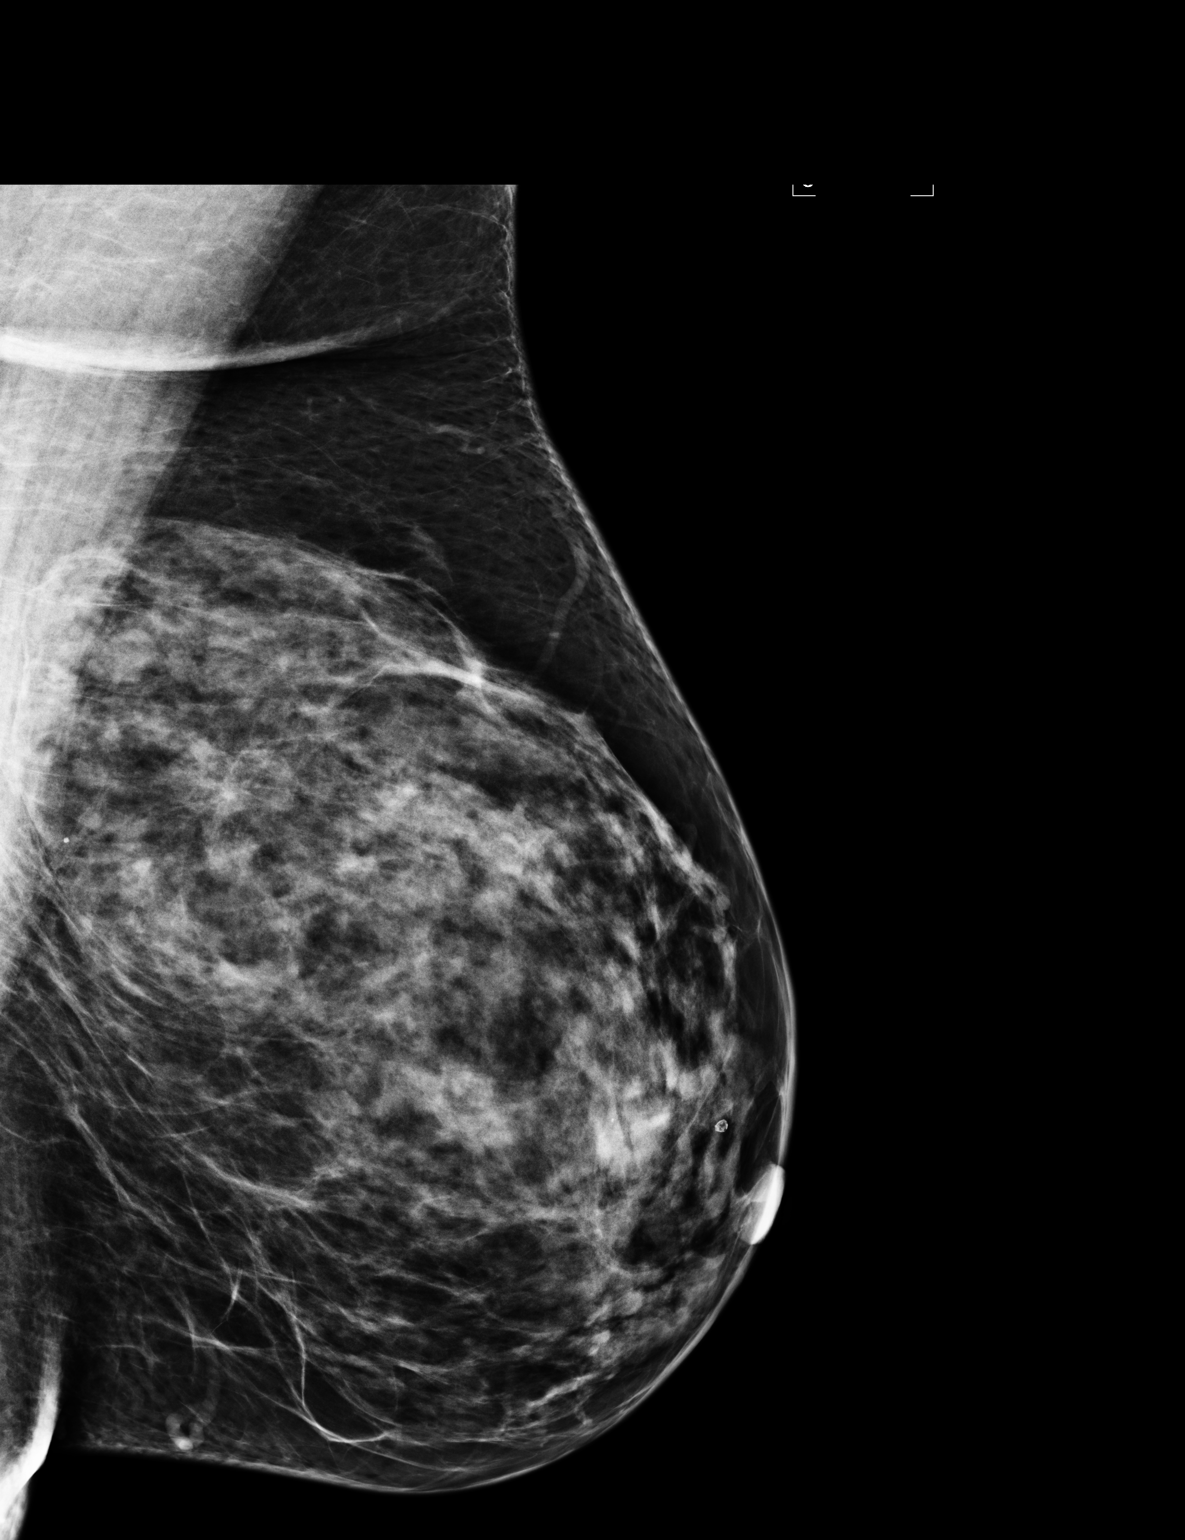

[R CC]
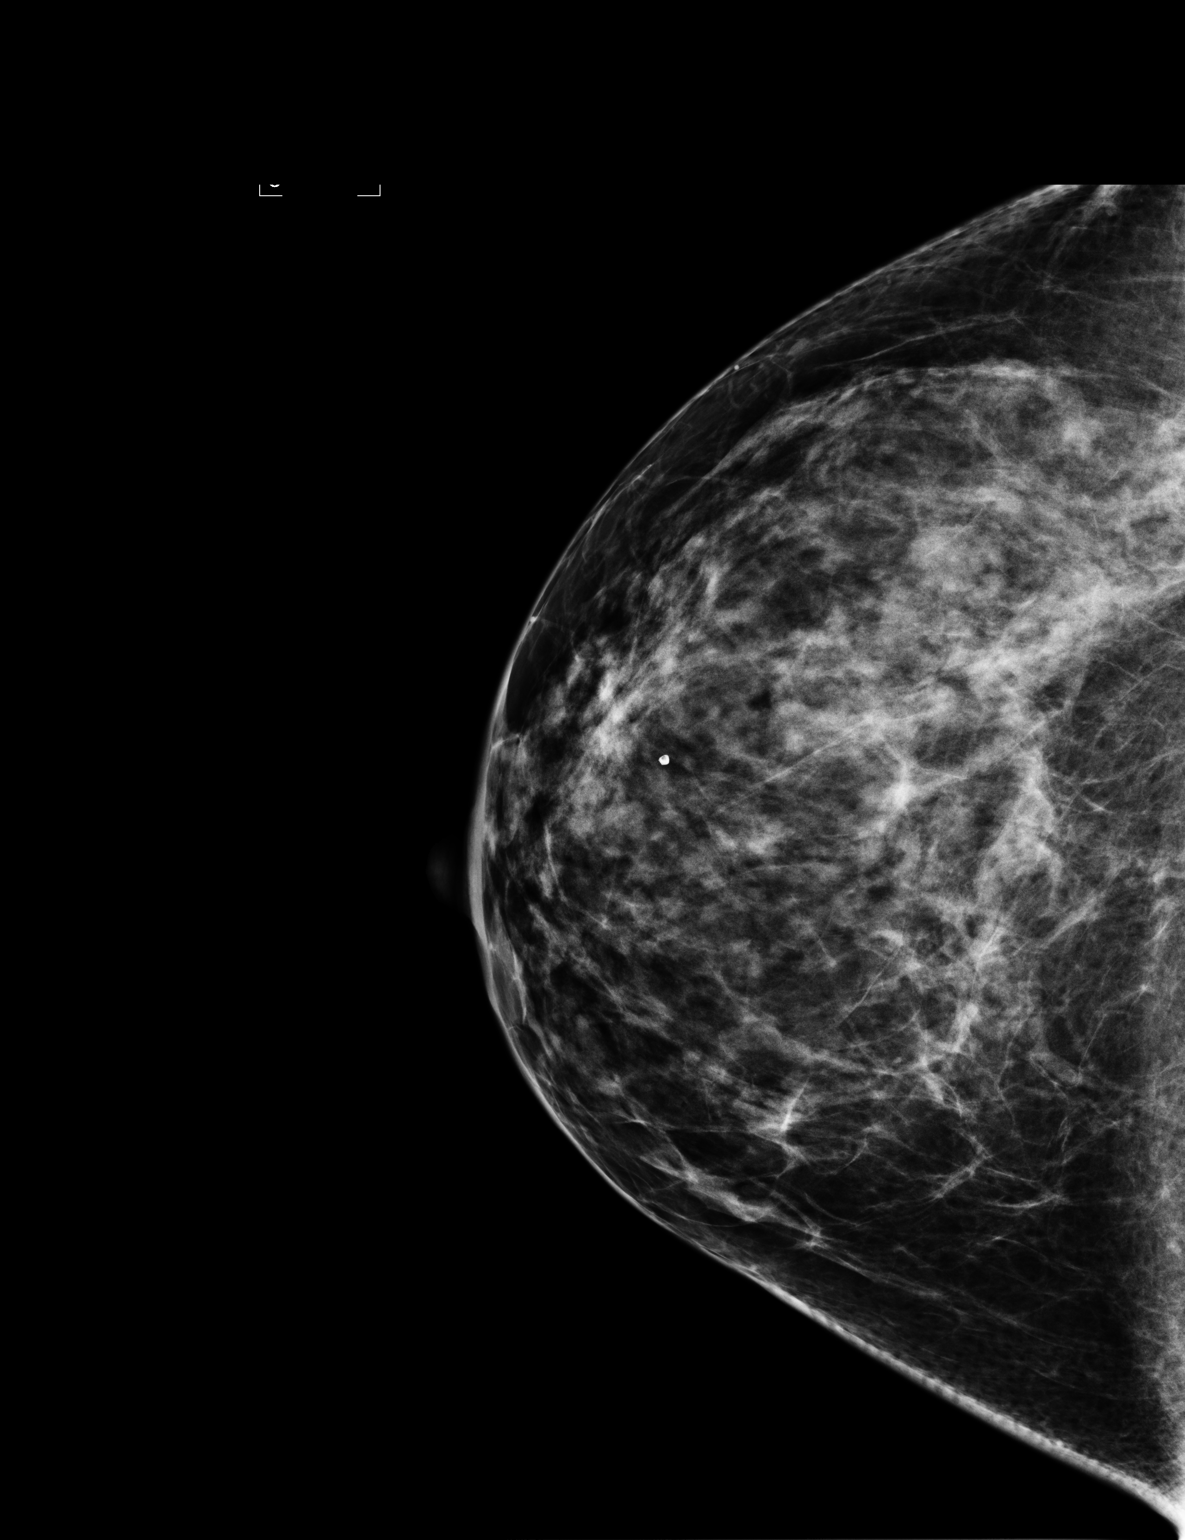

[3 of 3 positions shown; findings below may reference images not displayed]

IMPRESSION: No specific mammographic evidence of malignancy.  Next screening mammogram is recommended in one 
year.

A result letter of this screening mammogram will be mailed directly to the patient.

ASSESSMENT: Negative - BI-RADS 1

Screening mammogram in 1 year.
,

## 2012-06-26 ENCOUNTER — Other Ambulatory Visit: Payer: Self-pay | Admitting: Nurse Practitioner

## 2012-06-26 DIAGNOSIS — Z139 Encounter for screening, unspecified: Secondary | ICD-10-CM

## 2012-06-26 DIAGNOSIS — E039 Hypothyroidism, unspecified: Secondary | ICD-10-CM

## 2012-06-26 DIAGNOSIS — Z13220 Encounter for screening for lipoid disorders: Secondary | ICD-10-CM

## 2012-06-27 ENCOUNTER — Ambulatory Visit (HOSPITAL_COMMUNITY)
Admission: RE | Admit: 2012-06-27 | Discharge: 2012-06-27 | Disposition: A | Discharge status: Home / Self Care | Payer: 59 | Source: Ambulatory Visit | Attending: Nurse Practitioner | Admitting: Nurse Practitioner

## 2012-06-27 DIAGNOSIS — Z1231 Encounter for screening mammogram for malignant neoplasm of breast: Secondary | ICD-10-CM | POA: Insufficient documentation

## 2012-06-27 LAB — LIPID PANEL
Cholesterol: 247 mg/dL — ABNORMAL HIGH (ref 0–200)
HDL: 74 mg/dL (ref >39)
LDL Cholesterol: 152 mg/dL — ABNORMAL HIGH (ref 0–99)
Total CHOL/HDL Ratio: 3.3 Ratio
Triglycerides: 103 mg/dL (ref <150)
VLDL: 21 mg/dL (ref 0–40)

## 2012-06-27 IMAGING — MG MM DIGITAL SCREENING
5 series · 5 of 5 positions shown · non-contrast
Comparison: Previous exams.

CLINICAL DATA: Screening.

DIGITAL BILATERAL SCREENING MAMMOGRAM WITH CAD

[L CC]
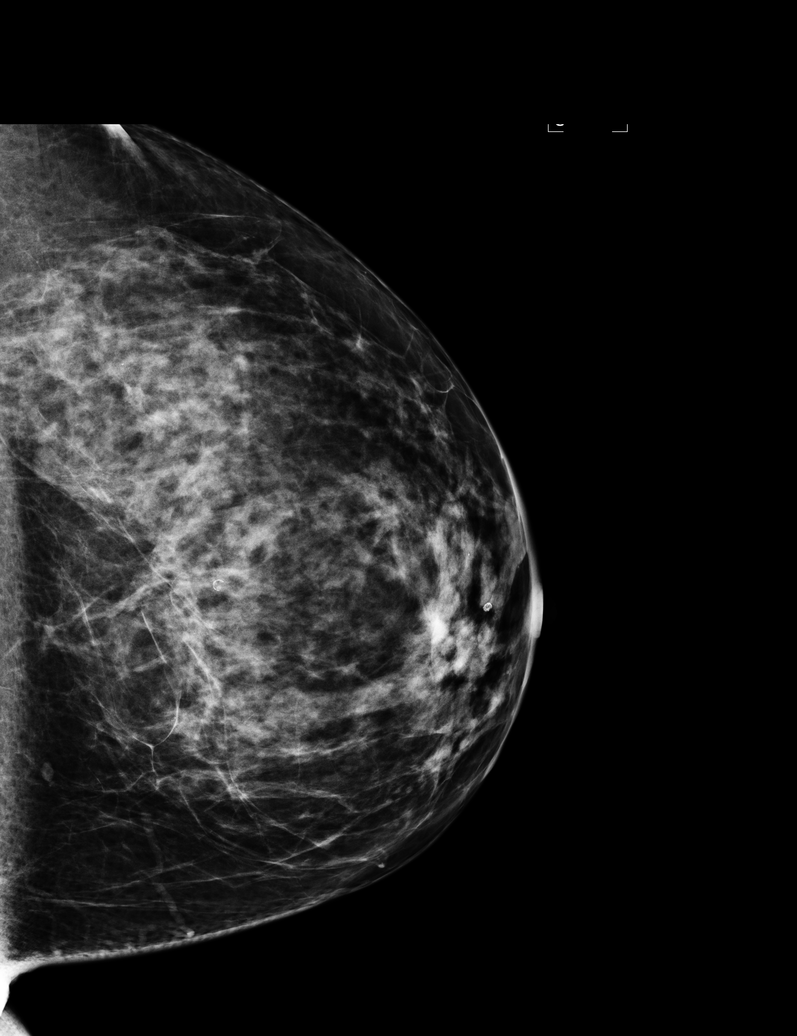

[L MLO]
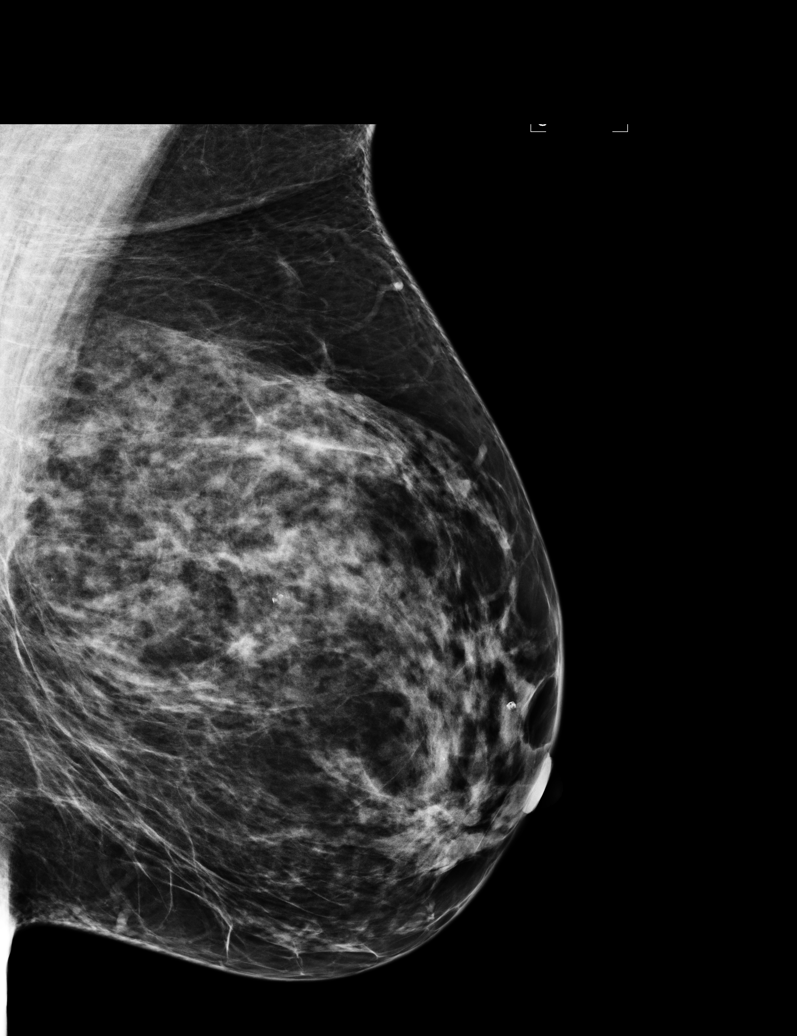

[R CC]
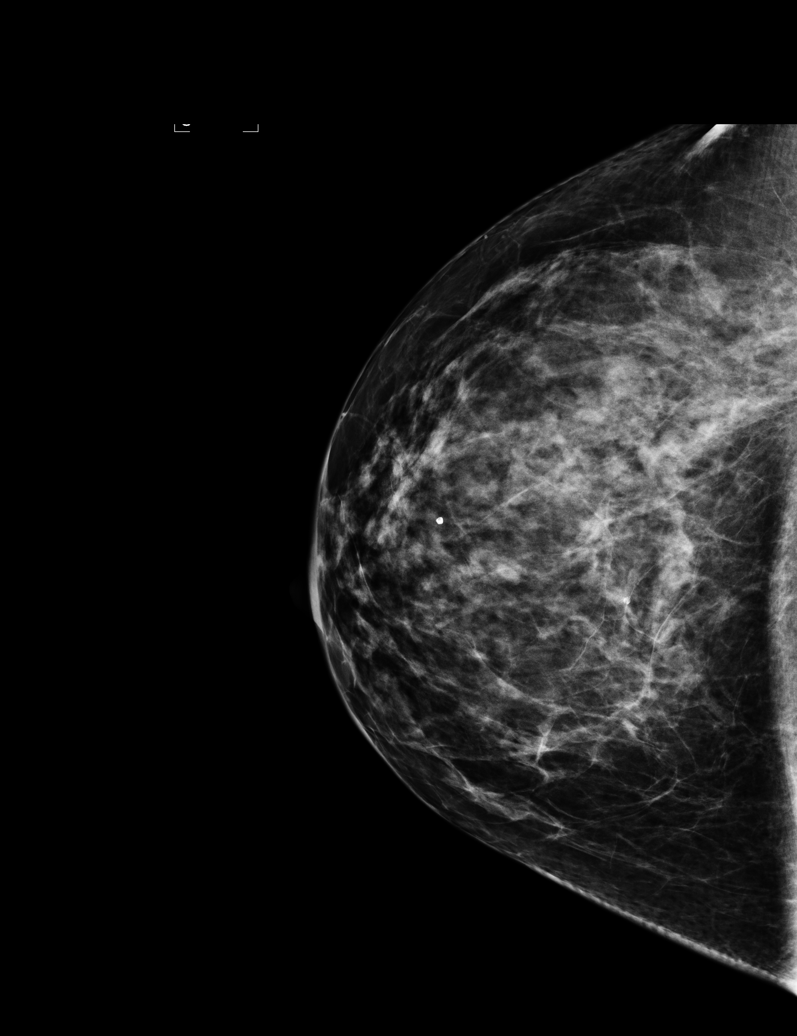

[R MLO (1 of 2)]
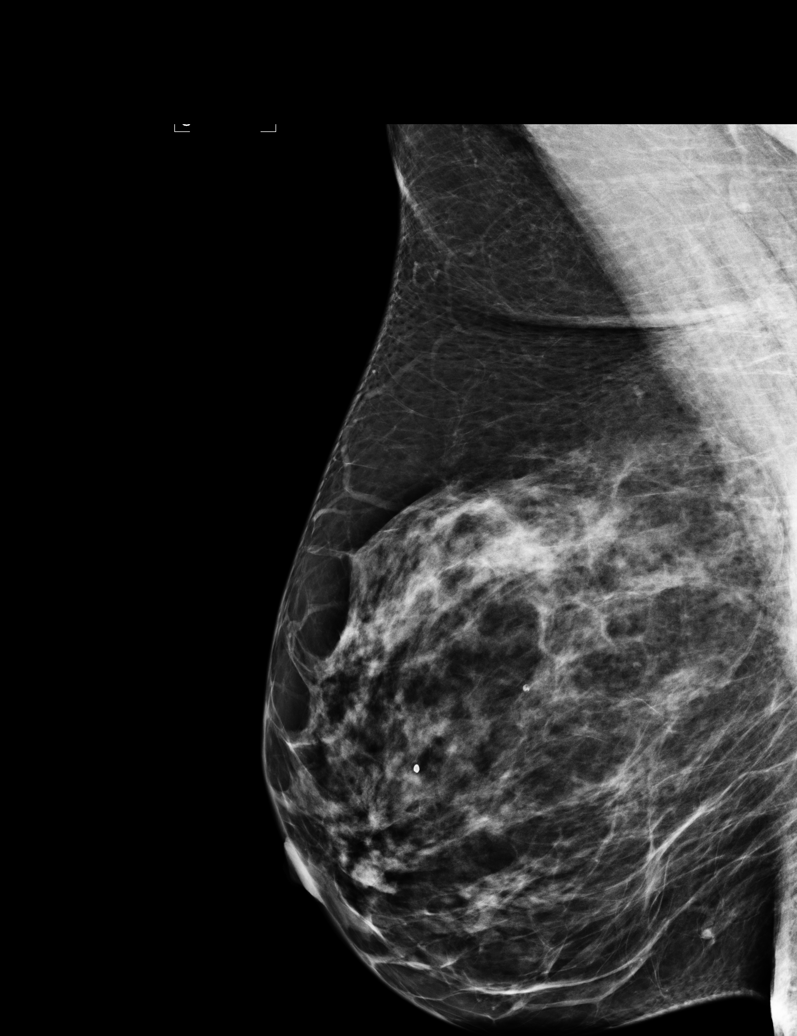

[R MLO (2 of 2)]
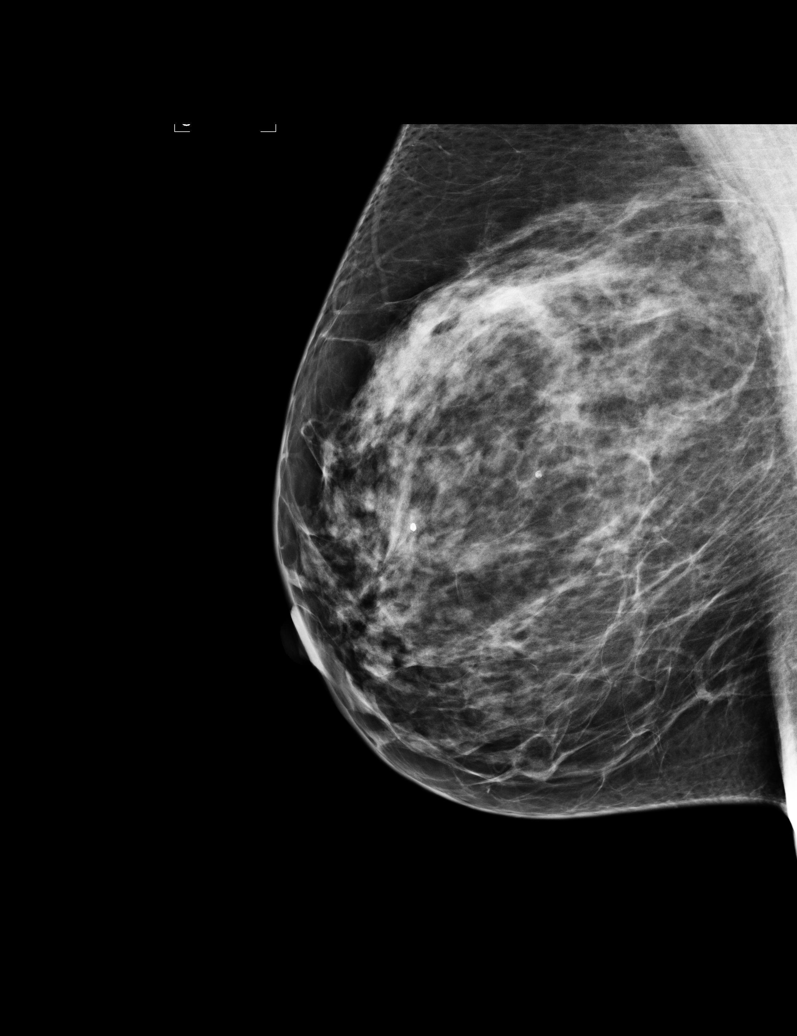

[5 of 5 positions shown; findings below may reference images not displayed]

FINDINGS: ACR Breast Density Category 3: The breast tissue is heterogeneously
dense.

No suspicious masses, architectural distortion, or calcifications
are present.

Images were processed with CAD.
IMPRESSION: No mammographic evidence of malignancy.

A result letter of this screening mammogram will be mailed directly
to the patient.

RECOMMENDATION:
Screening mammogram in one year. (Code:[BP])

BI-RADS CATEGORY 1:  Negative.

## 2012-07-03 ENCOUNTER — Encounter: Payer: Self-pay | Admitting: *Deleted

## 2012-07-06 ENCOUNTER — Encounter: Payer: Self-pay | Admitting: Nurse Practitioner

## 2012-07-06 ENCOUNTER — Ambulatory Visit (INDEPENDENT_AMBULATORY_CARE_PROVIDER_SITE_OTHER): Payer: 59 | Admitting: Nurse Practitioner

## 2012-07-06 VITALS — BP 142/88 | HR 70 | Ht 64.5 in | Wt 175.0 lb

## 2012-07-06 DIAGNOSIS — E039 Hypothyroidism, unspecified: Secondary | ICD-10-CM

## 2012-07-06 DIAGNOSIS — Z01419 Encounter for gynecological examination (general) (routine) without abnormal findings: Secondary | ICD-10-CM

## 2012-07-06 DIAGNOSIS — Z0000 Encounter for general adult medical examination without abnormal findings: Secondary | ICD-10-CM

## 2012-07-06 NOTE — Patient Instructions (Signed)
Soy; Black Cohosh; Flaxseed

## 2012-07-07 ENCOUNTER — Encounter: Payer: Self-pay | Admitting: Nurse Practitioner

## 2012-07-07 DIAGNOSIS — E039 Hypothyroidism, unspecified: Secondary | ICD-10-CM | POA: Insufficient documentation

## 2012-07-07 NOTE — Assessment & Plan Note (Signed)
Increased Levothyroxine to 88 mcg.  Repeat TSH in 3 months.

## 2012-07-07 NOTE — Progress Notes (Signed)
  Subjective:    Patient ID: Amy Shelton, female    DOB: 02/14/1961, 52 y.o.   MRN: [id number]  HPI Presents for wellness check-up.  No vaginal bleeding. Hot flashes only at night.  No pelvic pain.  No discharge.  Married, same sexual partner.  Regular eye exams and dental care. Last colonoscopy 2012.  Gets regular skin cancer screens.   Review of Systems  Constitutional: Negative for activity change and appetite change.  HENT: Negative for dental problem.   Eyes: Negative for visual disturbance.  Respiratory: Negative for chest tightness and shortness of breath.   Cardiovascular: Negative for chest pain.  Gastrointestinal: Negative for abdominal pain, diarrhea and constipation.  Genitourinary: Negative for dysuria, urgency, frequency, vaginal discharge, difficulty urinating, menstrual problem and pelvic pain.       Objective:   Physical Exam  Constitutional: She is oriented to person, place, and time. She appears well-developed. No distress.  HENT:  Right Ear: External ear normal.  Left Ear: External ear normal.  Mouth/Throat: Oropharynx is clear and moist.  Neck: Normal range of motion. Neck supple. No tracheal deviation present. No thyromegaly present.  Cardiovascular: Normal rate, regular rhythm and normal heart sounds.  Exam reveals no gallop.   No murmur heard. Pulmonary/Chest: Effort normal and breath sounds normal.  Abdominal: Soft. She exhibits no distension. There is no tenderness.  Genitourinary: Vagina normal and uterus normal. No vaginal discharge found.  Musculoskeletal: She exhibits no edema.  Lymphadenopathy:    She has no cervical adenopathy.  Neurological: She is alert and oriented to person, place, and time.  Skin: Skin is warm and dry. No rash noted.  Psychiatric: She has a normal mood and affect. Her behavior is normal.  Breasts:  No masses.  Axillae:  No adeno Rectal exam:  Normal; no stool for hemoccult. Normal Mammogram 06/26/2012.      Assessment &  Plan:  Well woman exam  See recent labs.  Follow-up as planned. Discussed natural alternatives for hot flashes.

## 2012-12-12 ENCOUNTER — Ambulatory Visit (INDEPENDENT_AMBULATORY_CARE_PROVIDER_SITE_OTHER): Payer: 59 | Admitting: Orthopedic Surgery

## 2012-12-12 ENCOUNTER — Ambulatory Visit (INDEPENDENT_AMBULATORY_CARE_PROVIDER_SITE_OTHER): Payer: 59

## 2012-12-12 VITALS — BP 131/75 | Ht 64.0 in | Wt 173.0 lb

## 2012-12-12 DIAGNOSIS — M23322 Other meniscus derangements, posterior horn of medial meniscus, left knee: Secondary | ICD-10-CM

## 2012-12-12 DIAGNOSIS — M25569 Pain in unspecified knee: Secondary | ICD-10-CM

## 2012-12-12 DIAGNOSIS — M25562 Pain in left knee: Secondary | ICD-10-CM

## 2012-12-12 DIAGNOSIS — M1710 Unilateral primary osteoarthritis, unspecified knee: Secondary | ICD-10-CM

## 2012-12-12 DIAGNOSIS — M23329 Other meniscus derangements, posterior horn of medial meniscus, unspecified knee: Secondary | ICD-10-CM

## 2012-12-12 NOTE — Patient Instructions (Addendum)
Watch for catching of the knee or popping Take aleve Activities as tolerated but try to walk on level ground and avoid heels Meniscus Injury of the Knee, Arthroscopy You may have an internal derangement of the knee. This means something is wrong inside the knee. Your caregiver can make a more accurate diagnosis (learning what is wrong) by performing an arthroscopic procedure. Your knee has two layers of cartilage. Articular cartilage covers the bone ends. It lets your knee bend and move smoothly. Two menisci (thick pads of cartilage that form a rim inside the joint) help absorb shock. They stabilize your knee. Ligaments bind the bones together. They support your knee joint. Muscles move the joint, help support your knee, and take stress off the joint itself.  ABOUT THE PROCEDURE Arthroscopy is a surgical technique. It allows your orthopedic surgeon to diagnose and treat your knee injury with accuracy. The surgeon looks into your knee through a small scope. The scope is like a small (pencil-sized) telescope. Arthroscopy is less invasive than open knee surgery. You can expect a more rapid recovery. Following your caregiver's instructions will help you recover rapidly and completely. Use crutches, rest, elevate, ice, and do knee exercises as instructed. The length of recovery depends on various factors. These factors include type of injury, age, physical condition, medical conditions, and your determination. How Amy Shelton you will be away from your normal activities will depend on what kind of knee problem you have. It will also depend on how much tissue is damaged. Rebuilding your muscles after arthroscopy helps ensure a full recovery. RECOVERY Recovery after a meniscus injury depends on how much meniscus is damaged. It also depends on whether or not you have damaged other knee tissue. With small tears, your recovery may take a couple weeks. Larger tears will take longer. Meniscus injuries can usually be treated  during arthroscopy. If your injury is on the inner edge of the meniscus, your surgeon may trim the meniscus back to a smooth rim. In other cases, your surgeon will try to repair a damaged meniscus with sutures (stitches). This may lengthen your rehabilitation. It may provide better Amy Shelton-term health by helping your knee retain its shock absorption abilities. Use crutches, limit weight bearing, rest, elevate, apply ice, and exercise your knee as instructed. If a brace is applied, use as directed. The length of recovery depends on various factors including type of injury, age, physical condition, other medical conditions, and your determination. Your caregiver will help with instructions for rehabilitation of your knee. HOME CARE INSTRUCTIONS  Use crutches and knee exercises as instructed.  Applying an ice pack to your operative site may help with discomfort. It may also keep the swelling down.  Only take over-the-counter or prescription medicines for pain, discomfort, inflammation (soreness)or fever as directed by your caregiver. You may use these only if your caregiver has not given medications that would interfere.  You may resume normal diet and activities as directed. SEEK MEDICAL ATTENTION IF:  There is increased bleeding (more than a small spot) from the wound.  You notice redness, swelling, or increasing pain in the wound.  Pus is coming from wound.  An unexplained oral temperature above 102 F (38.9 C) develops, or as your caregiver suggests.  You notice a foul smell coming from the wound or dressing. SEEK IMMEDIATE MEDICAL CARE IF:  You develop a rash.  You have difficulty breathing.  You have any allergic problems. Document Released: 02/20/2000 Document Revised: 05/17/2011 Document Reviewed: 05/08/2007 [employer] Patient Information  2014 [employer].  

## 2012-12-13 ENCOUNTER — Encounter: Payer: Self-pay | Admitting: Orthopedic Surgery

## 2012-12-13 DIAGNOSIS — M25562 Pain in left knee: Secondary | ICD-10-CM | POA: Insufficient documentation

## 2012-12-13 DIAGNOSIS — M1710 Unilateral primary osteoarthritis, unspecified knee: Secondary | ICD-10-CM | POA: Insufficient documentation

## 2012-12-13 DIAGNOSIS — M23329 Other meniscus derangements, posterior horn of medial meniscus, unspecified knee: Secondary | ICD-10-CM | POA: Insufficient documentation

## 2012-12-13 DIAGNOSIS — M179 Osteoarthritis of knee, unspecified: Secondary | ICD-10-CM | POA: Insufficient documentation

## 2012-12-13 NOTE — Progress Notes (Signed)
Patient ID: Amy Shelton, female   DOB: 07-05-1960, 52 y.o.   MRN: [id number]  Chief Complaint  Patient presents with  . Knee Pain    Left knee pain that comes and goes   HISTORY: This is a 52 year old female who presents with 2 month history of pain on the medial side of her left knee which came on suddenly after she was walking on unlevel ground. She complains of dull to out of 10 intermittent pain which is worse after exercise, at night or when turning or twisting or pivoting on the leg. She seems to have some relief with Advil, Aleve or a heating pad but it is worse when walking on uneven surface. She denies numbness tingling locking catching or swelling.  Review of systems negative except for some carpal tunnellike symptoms with numbness in the wrist controlled with a night splint  No allergies  Past Medical History  Diagnosis Date  . Anemia     related to heavy vaginal bleeding-has resolved  . Hypothyroidism    Past Surgical History  Procedure Laterality Date  . Colonoscopy  02/05/2011    Procedure: COLONOSCOPY;  Surgeon: Arlyce Harman, MD;  Location: [facility];  Service: Endoscopy;  Laterality: N/A;  9:30 AM   The past, family history and social history have been reviewed and are recorded in the corresponding sections of epic   Blood pressure 131/75, height 5\' 4"  (1.626 m), weight 173 lb (78.472 kg), last menstrual period 01/05/2011.  General appearance is normal  The patient is alert and oriented x3  The patient's mood and affect are normal  Gait assessment:  The cardiovascular exam reveals normal pulses and temperature without edema swelling.  The lymphatic system is negative for palpable lymph nodes  The sensory exam is normal.  There are no pathologic reflexes.  Balance is normal.  Left Knee exam The patient has mild medial joint line tenderness without effusion, range of motion remains full and intact. Knee is stable to varus stress test as well as  anterior posterior drawer test. Motor exam is normal no atrophy skin is intact. Murray sign is positive and she has medial joint line tenderness as stated   X-ray left knee: No fracture or dislocation is seen minor medial joint space narrowing is noted  Assessment:    Left Mild osteoarthritis on the left Suspect  mild meniscal injury on the left    Plan:    Natural history and expected course discussed. Questions answered. Quad strengthening exercises.

## 2012-12-18 ENCOUNTER — Telehealth: Payer: Self-pay | Admitting: Nurse Practitioner

## 2012-12-18 ENCOUNTER — Other Ambulatory Visit: Payer: Self-pay | Admitting: Nurse Practitioner

## 2012-12-18 DIAGNOSIS — E785 Hyperlipidemia, unspecified: Secondary | ICD-10-CM

## 2012-12-18 DIAGNOSIS — E039 Hypothyroidism, unspecified: Secondary | ICD-10-CM

## 2012-12-18 NOTE — Telephone Encounter (Signed)
Papers given to patient.

## 2012-12-18 NOTE — Telephone Encounter (Signed)
Labs ordered. Papers at nurses desk.

## 2012-12-18 NOTE — Telephone Encounter (Signed)
Amy Shelton, Killings would like for her BW to be drawn up for her Thyroid, Cholesterol etc. Please do this so she can go by the end of this week and call her cell to tell her its in the computer and ready for her at [phone] Thank You (Her OV is 01/03/13)

## 2012-12-22 ENCOUNTER — Ambulatory Visit (INDEPENDENT_AMBULATORY_CARE_PROVIDER_SITE_OTHER): Payer: 59 | Admitting: *Deleted

## 2012-12-22 DIAGNOSIS — Z23 Encounter for immunization: Secondary | ICD-10-CM

## 2012-12-22 LAB — TSH: TSH: 4.402 u[IU]/mL (ref 0.350–4.500)

## 2012-12-22 LAB — LIPID PANEL
LDL Cholesterol: 136 mg/dL — ABNORMAL HIGH (ref 0–99)
Triglycerides: 111 mg/dL (ref <150)
VLDL: 22 mg/dL (ref 0–40)

## 2013-01-03 ENCOUNTER — Ambulatory Visit (INDEPENDENT_AMBULATORY_CARE_PROVIDER_SITE_OTHER): Payer: 59 | Admitting: Nurse Practitioner

## 2013-01-03 ENCOUNTER — Encounter: Payer: Self-pay | Admitting: Nurse Practitioner

## 2013-01-03 VITALS — BP 128/78 | Ht 64.0 in | Wt 170.6 lb

## 2013-01-03 DIAGNOSIS — E039 Hypothyroidism, unspecified: Secondary | ICD-10-CM

## 2013-01-03 MED ORDER — LEVOTHYROXINE SODIUM 100 MCG PO TABS: 100.0000 ug | ORAL_TABLET | Freq: Every day | ORAL | Status: DC

## 2013-01-05 ENCOUNTER — Encounter: Payer: Self-pay | Admitting: Nurse Practitioner

## 2013-01-05 NOTE — Progress Notes (Signed)
Subjective:  Presents to review her recent lab work. Compliant with medications. Eating a very healthy diet. Staying active, regular walking program. Denies tenderness around the thyroid gland, no difficulty swallowing.  Objective:   BP 128/78  Ht 5\' 4"  (1.626 m)  Wt 170 lb 9.6 oz (77.384 kg)  BMI 29.27 kg/m2  LMP 01/05/2011 NAD. Alert, oriented. Lungs clear. Heart regular rate rhythm. Thyroid normal limit to palpation and nontender. Labs dated 10/17 showed TSH 4.4; HDL 76 and LDL 136.  Assessment:Hypothyroidism - Plan: TSH, TSH  Plan: Meds ordered this encounter  Medications  . levothyroxine (SYNTHROID, LEVOTHROID) 100 MCG tablet    Sig: Take 1 tablet (100 mcg total) by mouth daily before breakfast.    Dispense:  90 tablet    Refill:  0    Order Specific Question:  Supervising Provider    Answer:  Merlyn Albert [[id number]]   Goal of therapy for TSH is 1-2. Increase levothyroxine to 100 mg daily, repeat TSH in 3 months. Reviewed lipid profile. Continue low-fat low-cholesterol diet.

## 2013-01-05 NOTE — Assessment & Plan Note (Signed)
TSH 4.4. Goal of therapy for TSH is 1-2. Increase levothyroxine to 100 mg daily, repeat TSH in 3 months.

## 2013-03-28 LAB — TSH: TSH: 2.681 u[IU]/mL (ref 0.350–4.500)

## 2013-03-29 ENCOUNTER — Other Ambulatory Visit: Payer: Self-pay

## 2013-03-29 MED ORDER — LEVOTHYROXINE SODIUM 100 MCG PO TABS: 100.0000 ug | ORAL_TABLET | Freq: Every day | ORAL | Status: DC

## 2013-04-05 ENCOUNTER — Ambulatory Visit: Payer: 59

## 2013-04-06 ENCOUNTER — Ambulatory Visit (INDEPENDENT_AMBULATORY_CARE_PROVIDER_SITE_OTHER): Payer: 59 | Admitting: *Deleted

## 2013-04-06 DIAGNOSIS — Z23 Encounter for immunization: Secondary | ICD-10-CM

## 2013-05-16 ENCOUNTER — Other Ambulatory Visit: Payer: Self-pay | Admitting: Nurse Practitioner

## 2013-05-16 DIAGNOSIS — Z1231 Encounter for screening mammogram for malignant neoplasm of breast: Secondary | ICD-10-CM

## 2013-06-28 ENCOUNTER — Ambulatory Visit (HOSPITAL_COMMUNITY): Payer: 59

## 2013-06-29 ENCOUNTER — Ambulatory Visit (HOSPITAL_COMMUNITY): Payer: 59

## 2013-07-02 ENCOUNTER — Ambulatory Visit (HOSPITAL_COMMUNITY)
Admission: RE | Admit: 2013-07-02 | Discharge: 2013-07-02 | Disposition: A | Discharge status: Home / Self Care | Payer: 59 | Source: Ambulatory Visit | Attending: Nurse Practitioner | Admitting: Nurse Practitioner

## 2013-07-02 DIAGNOSIS — Z1231 Encounter for screening mammogram for malignant neoplasm of breast: Secondary | ICD-10-CM | POA: Insufficient documentation

## 2013-07-02 IMAGING — MG MM DIGITAL SCREENING
4 series · 4 of 4 positions shown · non-contrast
Comparison: Previous exam(s).

CLINICAL DATA: Screening.

EXAM:
DIGITAL SCREENING BILATERAL MAMMOGRAM WITH CAD

[L CC]
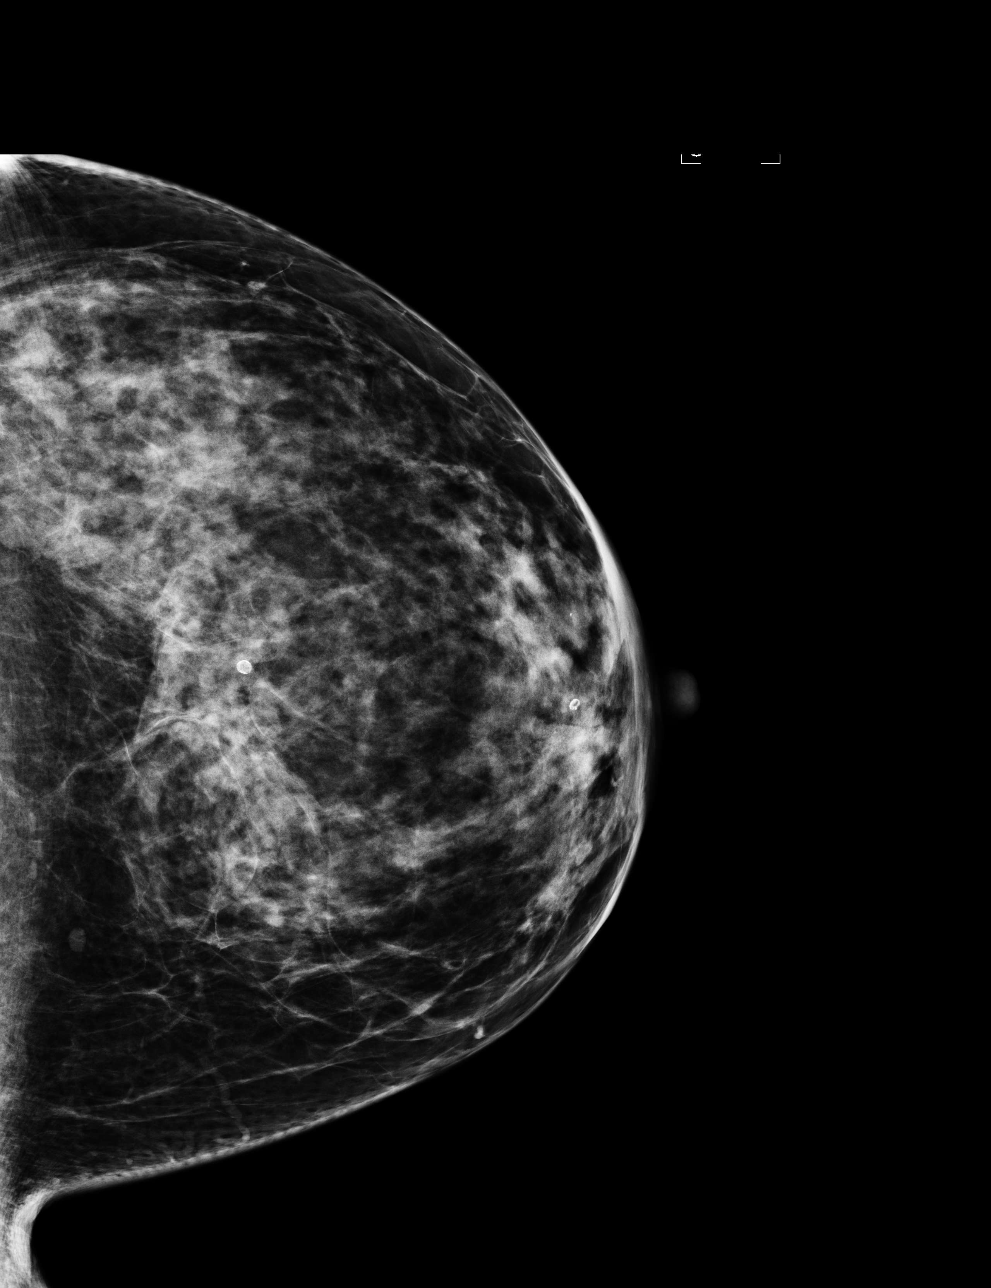

[L MLO]
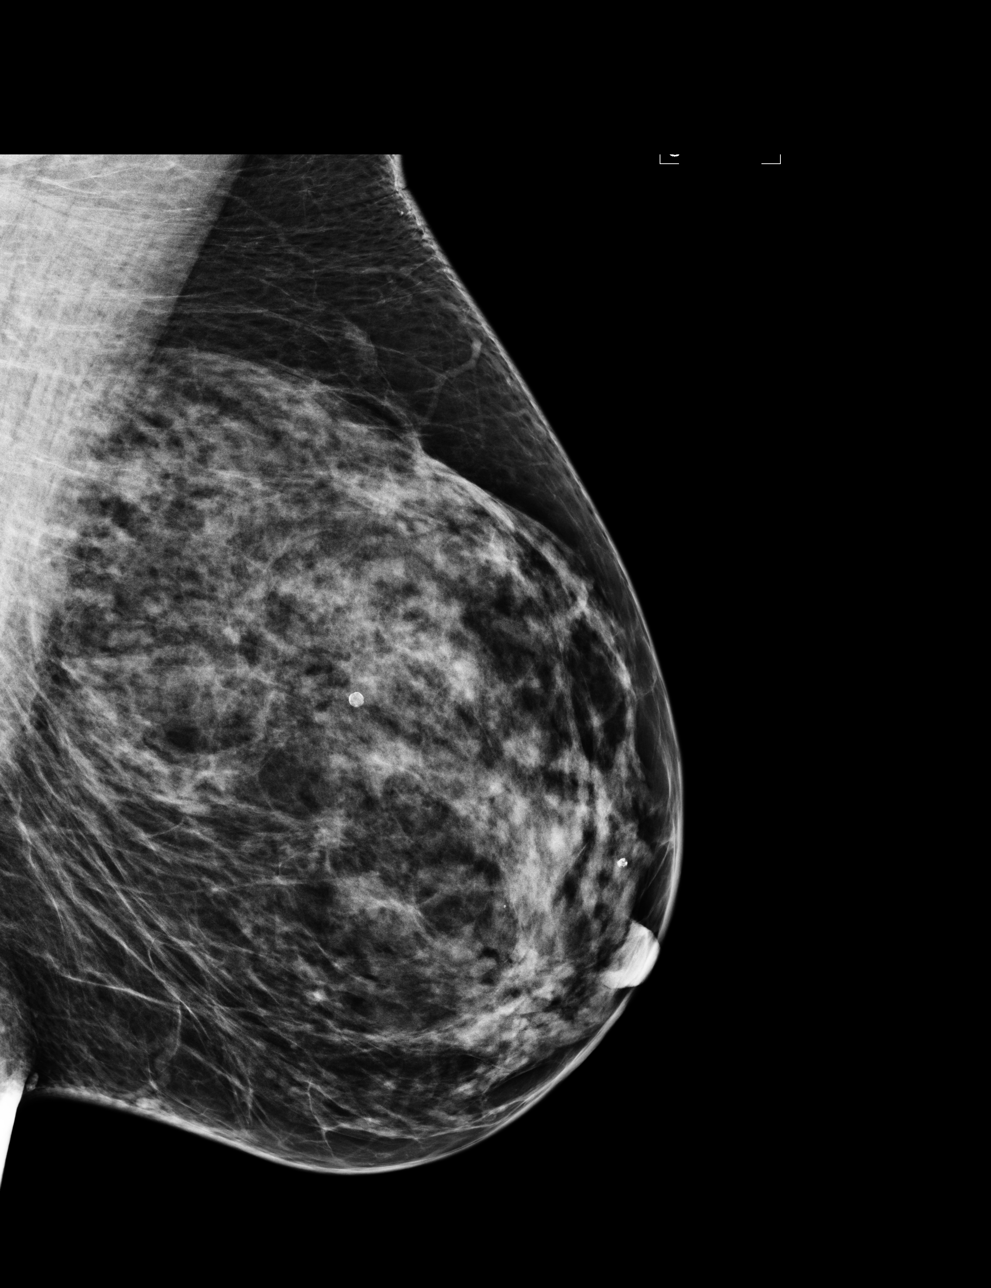

[R CC]
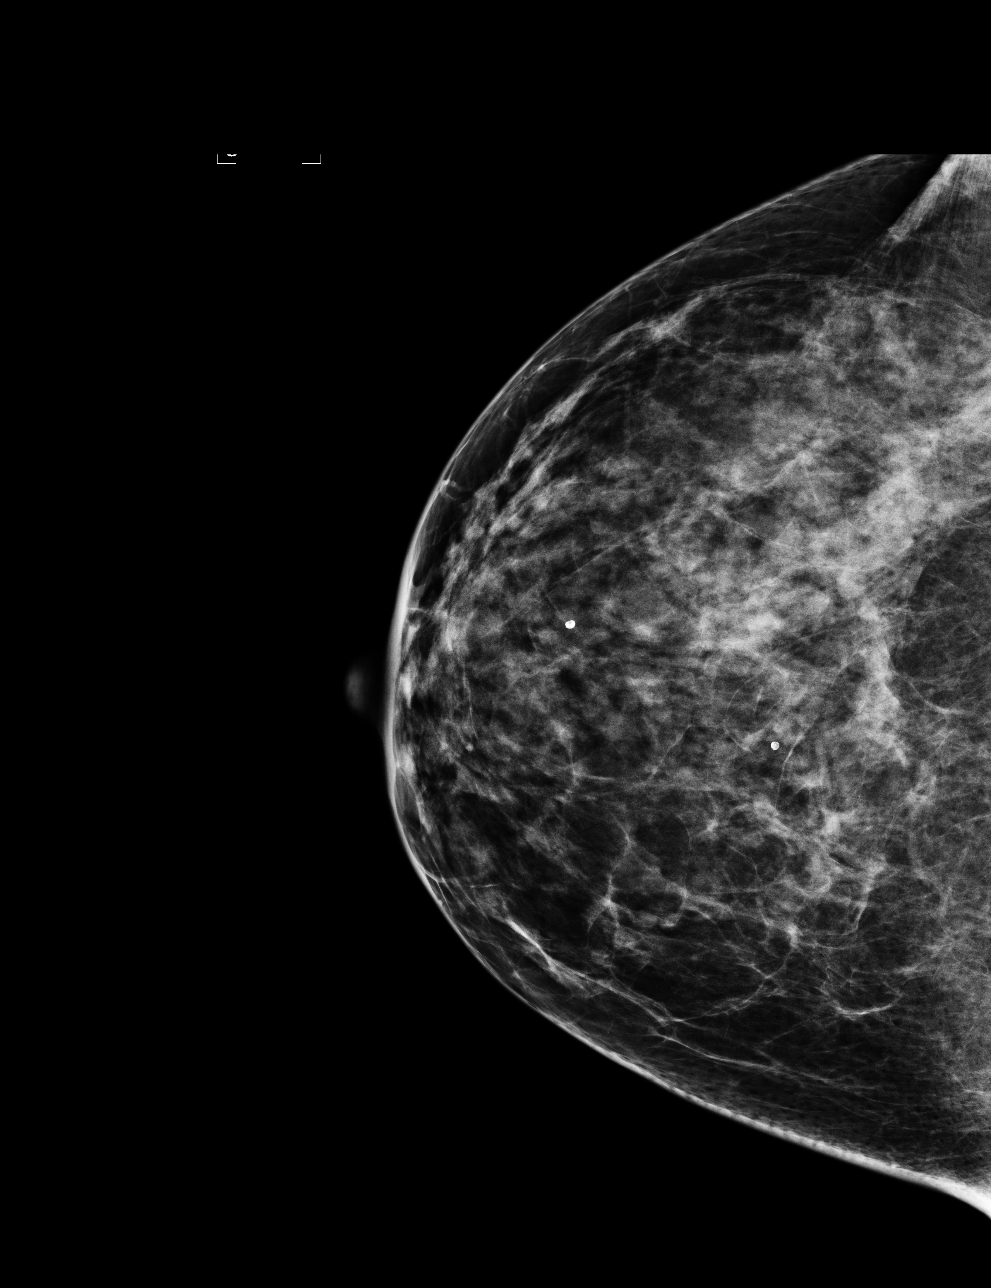

[R MLO]
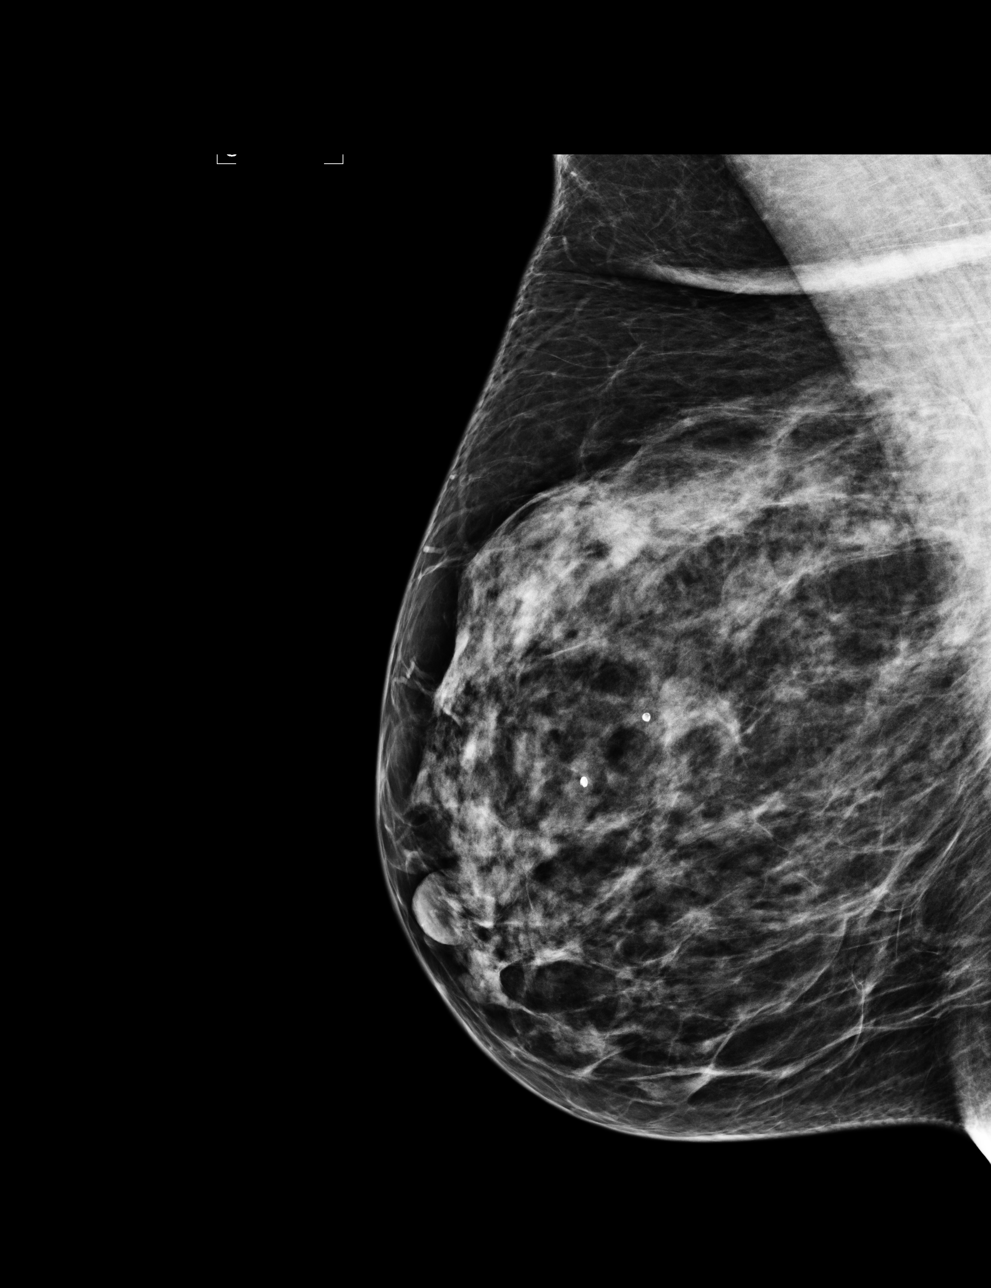

[4 of 4 positions shown; findings below may reference images not displayed]

ACR Breast Density Category c: The breast tissue is heterogeneously
dense, which may obscure small masses.
FINDINGS: There are no findings suspicious for malignancy. Images were
processed with CAD.
IMPRESSION: No mammographic evidence of malignancy. A result letter of this
screening mammogram will be mailed directly to the patient.

RECOMMENDATION:
Screening mammogram in one year. (Code:[0J])

BI-RADS CATEGORY  1: Negative.

## 2013-07-09 ENCOUNTER — Ambulatory Visit (INDEPENDENT_AMBULATORY_CARE_PROVIDER_SITE_OTHER): Payer: 59 | Admitting: Nurse Practitioner

## 2013-07-09 ENCOUNTER — Encounter: Payer: Self-pay | Admitting: Nurse Practitioner

## 2013-07-09 VITALS — BP 130/78 | Ht 64.75 in | Wt 155.0 lb

## 2013-07-09 DIAGNOSIS — Z0000 Encounter for general adult medical examination without abnormal findings: Secondary | ICD-10-CM

## 2013-07-09 DIAGNOSIS — Z01419 Encounter for gynecological examination (general) (routine) without abnormal findings: Secondary | ICD-10-CM

## 2013-07-09 MED ORDER — VALACYCLOVIR HCL 1 G PO TABS: 1000.0000 mg | ORAL_TABLET | Freq: Two times a day (BID) | ORAL | Status: DC

## 2013-07-09 NOTE — Progress Notes (Signed)
   Subjective:    Patient ID: Amy Shelton, female    DOB: 1961/01/11, 53 y.o.   MRN: [id number]  HPI presents for her wellness checkup. Her scales at home this morning for 8 pounds less than the ones here. Regular exercise. Healthy diet. Gets regular skin cancer screening at local dermatologist. Regular vision and dental exams. No menstrual cycle for at least 3 years. No vaginal discharge or pelvic pain.., Same sexual partner.    Review of Systems  Constitutional: Negative for activity change, appetite change and fatigue.  HENT: Positive for rhinorrhea and sneezing. Negative for dental problem, ear pain and sore throat.   Respiratory: Negative for cough, chest tightness, shortness of breath and wheezing.   Cardiovascular: Negative for chest pain.  Gastrointestinal: Negative for nausea, vomiting, abdominal pain, diarrhea, constipation, blood in stool and abdominal distention.  Genitourinary: Negative for dysuria, urgency, frequency, vaginal bleeding, vaginal discharge, enuresis, difficulty urinating, genital sores and pelvic pain.       Objective:   Physical Exam  Vitals reviewed. Constitutional: She is oriented to person, place, and time. She appears well-developed. No distress.  HENT:  Right Ear: External ear normal.  Left Ear: External ear normal.  Mouth/Throat: Oropharynx is clear and moist.  Neck: Normal range of motion. Neck supple. No tracheal deviation present. No thyromegaly present.  Cardiovascular: Normal rate, regular rhythm and normal heart sounds.  Exam reveals no gallop.   No murmur heard. Pulmonary/Chest: Effort normal and breath sounds normal.  Abdominal: Soft. She exhibits no distension. There is no tenderness.  Genitourinary: Vagina normal and uterus normal. No vaginal discharge found.  External GU: No rashes or lesions noted. Vagina no discharge. No CMT. Bimanual exam no masses or tenderness noted. Rectal exam no masses, no stool for Hemoccult.  Musculoskeletal:  She exhibits no edema.  Lymphadenopathy:    She has no cervical adenopathy.  Neurological: She is alert and oriented to person, place, and time.  Skin: Skin is warm and dry. No rash noted.  Significant sun damage.  Psychiatric: She has a normal mood and affect. Her behavior is normal.   breast exam: Minimal fine nodularity, no dominant masses. Axilla no adenopathy.        Assessment & Plan:  Well woman exam  routine lab work including TSH in October. Continue exercise and healthy diet. Daily vitamin D and calcium. Next physical in one year.

## 2013-09-03 ENCOUNTER — Other Ambulatory Visit: Payer: Self-pay | Admitting: Nurse Practitioner

## 2013-12-05 ENCOUNTER — Ambulatory Visit (INDEPENDENT_AMBULATORY_CARE_PROVIDER_SITE_OTHER): Payer: 59

## 2013-12-05 DIAGNOSIS — Z23 Encounter for immunization: Secondary | ICD-10-CM

## 2014-01-21 ENCOUNTER — Other Ambulatory Visit: Payer: Self-pay

## 2014-01-21 MED ORDER — ONDANSETRON 4 MG PO TBDP: 4.0000 mg | ORAL_TABLET | Freq: Four times a day (QID) | ORAL | Status: DC | PRN

## 2014-02-18 ENCOUNTER — Other Ambulatory Visit: Payer: Self-pay | Admitting: Nurse Practitioner

## 2014-02-18 DIAGNOSIS — Z13220 Encounter for screening for lipoid disorders: Secondary | ICD-10-CM

## 2014-02-18 DIAGNOSIS — E039 Hypothyroidism, unspecified: Secondary | ICD-10-CM

## 2014-02-18 NOTE — Progress Notes (Addendum)
Patient notified that blood work orders are complete

## 2014-02-25 LAB — LIPID PANEL
CHOL/HDL RATIO: 3.2 ratio
Cholesterol: 246 mg/dL — ABNORMAL HIGH (ref 0–200)
HDL: 77 mg/dL (ref >39)
LDL CALC: 154 mg/dL — AB (ref 0–99)
TRIGLYCERIDES: 74 mg/dL (ref <150)
VLDL: 15 mg/dL (ref 0–40)

## 2014-02-25 LAB — TSH: TSH: 1.933 u[IU]/mL (ref 0.350–4.500)

## 2014-03-13 ENCOUNTER — Other Ambulatory Visit: Payer: Self-pay | Admitting: Nurse Practitioner

## 2014-06-24 ENCOUNTER — Other Ambulatory Visit: Payer: Self-pay | Admitting: Nurse Practitioner

## 2014-06-24 MED ORDER — LEVOTHYROXINE SODIUM 100 MCG PO TABS: ORAL_TABLET | ORAL | Status: DC

## 2014-08-20 ENCOUNTER — Other Ambulatory Visit: Payer: Self-pay

## 2014-08-20 MED ORDER — VALACYCLOVIR HCL 1 G PO TABS: 1000.0000 mg | ORAL_TABLET | Freq: Three times a day (TID) | ORAL | Status: DC

## 2014-08-29 ENCOUNTER — Ambulatory Visit (HOSPITAL_COMMUNITY)
Admission: RE | Admit: 2014-08-29 | Discharge: 2014-08-29 | Disposition: A | Discharge status: Home / Self Care | Payer: 59 | Source: Ambulatory Visit | Attending: Nurse Practitioner | Admitting: Nurse Practitioner

## 2014-08-29 ENCOUNTER — Other Ambulatory Visit: Payer: Self-pay | Admitting: Nurse Practitioner

## 2014-08-29 ENCOUNTER — Telehealth: Payer: Self-pay | Admitting: Family Medicine

## 2014-08-29 DIAGNOSIS — Z1231 Encounter for screening mammogram for malignant neoplasm of breast: Secondary | ICD-10-CM | POA: Insufficient documentation

## 2014-08-29 DIAGNOSIS — E039 Hypothyroidism, unspecified: Secondary | ICD-10-CM

## 2014-08-29 DIAGNOSIS — Z139 Encounter for screening, unspecified: Secondary | ICD-10-CM

## 2014-08-29 IMAGING — MG MM SCREENING BREAST TOMO BILATERAL
8 series · 9 of 24 positions shown · non-contrast
Comparison: Previous exam(s).

CLINICAL DATA: Screening.

EXAM:
DIGITAL SCREENING BILATERAL MAMMOGRAM WITH 3D TOMO WITH CAD

[L MLO]
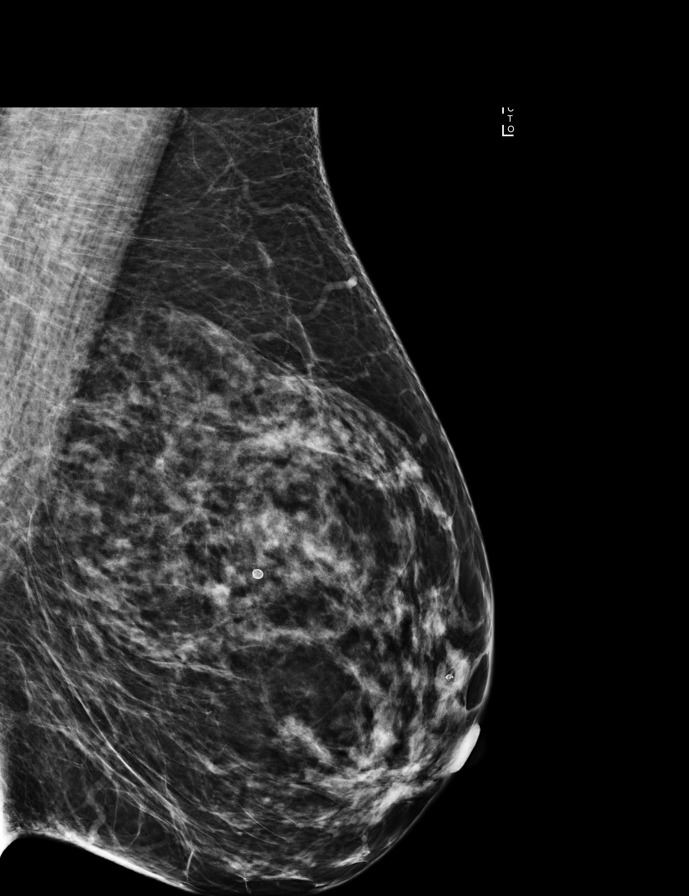

[L CC]
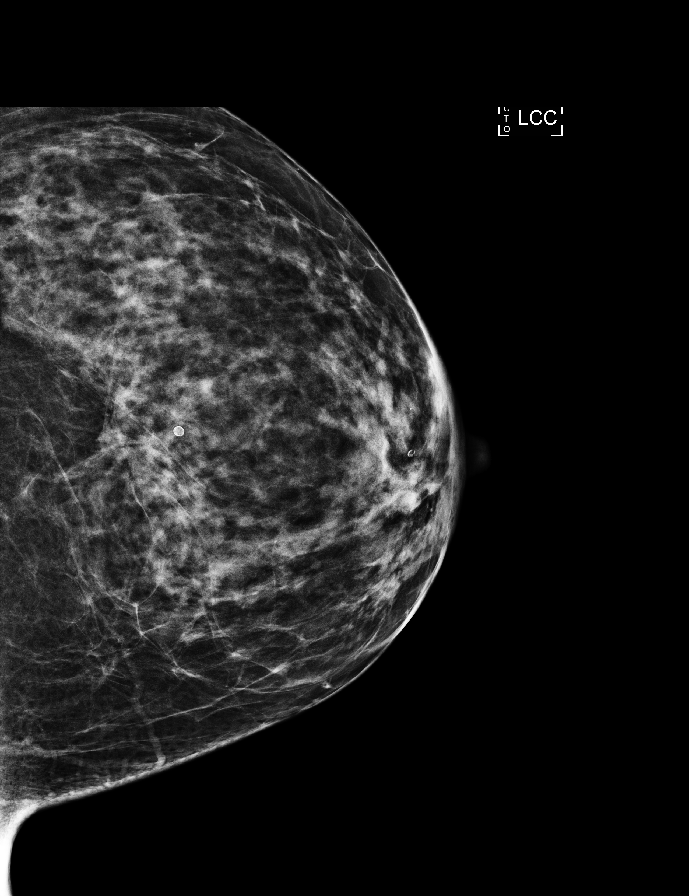

[R MLO]
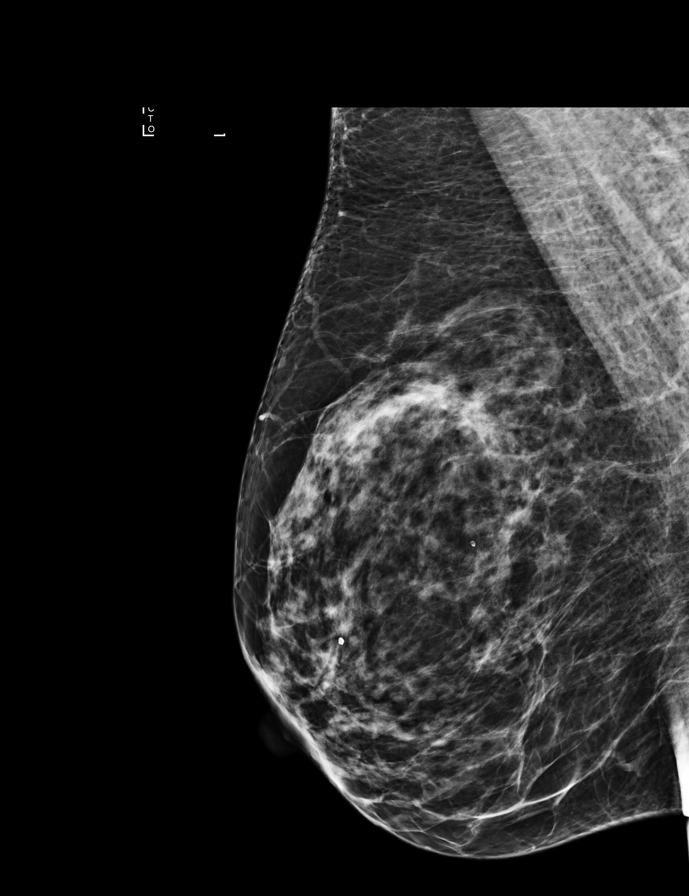

[R CC]
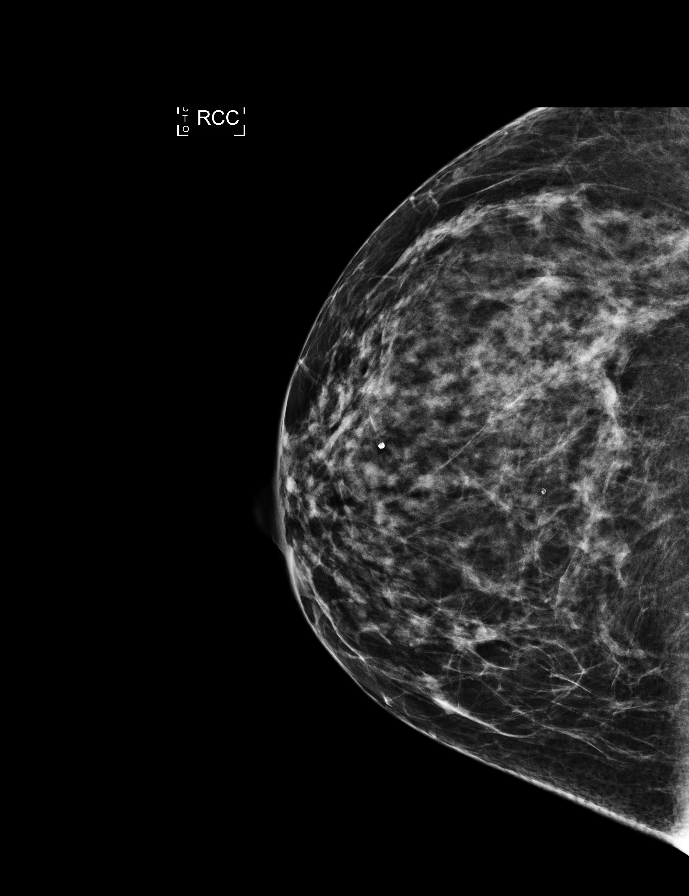

[R MLO tomo · 2 of 76 frames shown]
[frame 25/76]
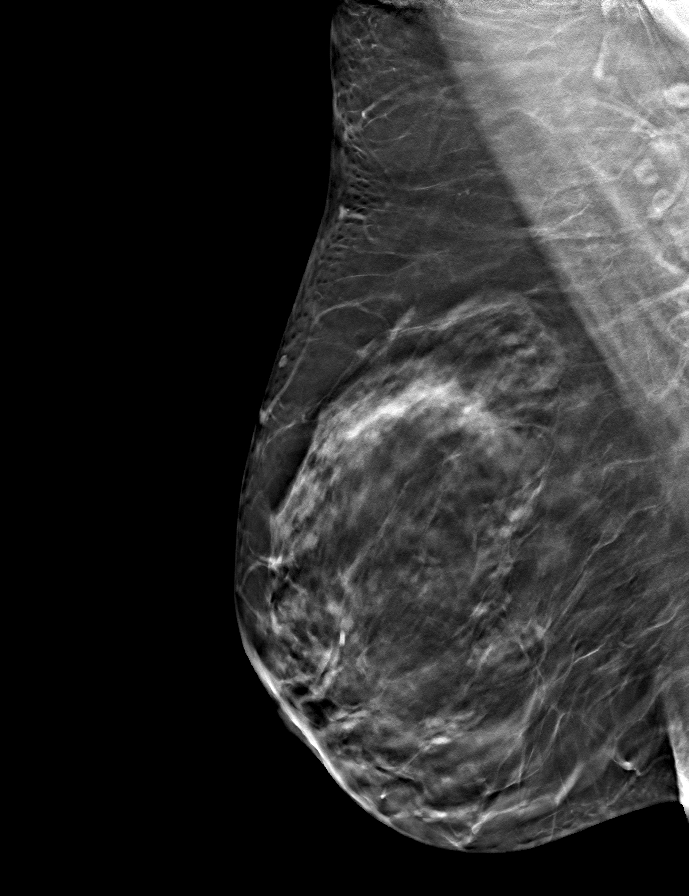
[frame 39/76]
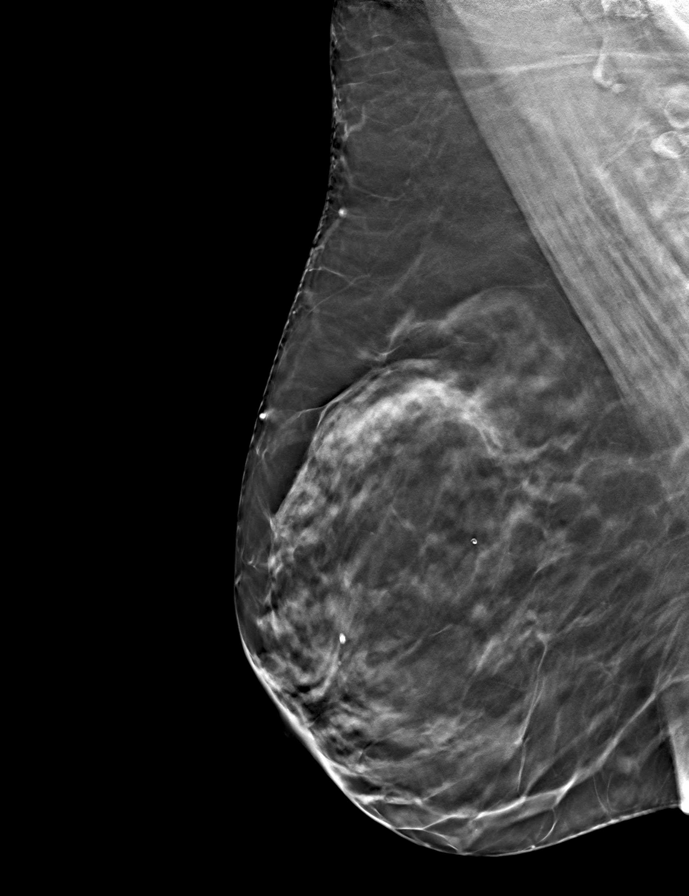

[L MLO tomo · tomo slice 37/74.0]
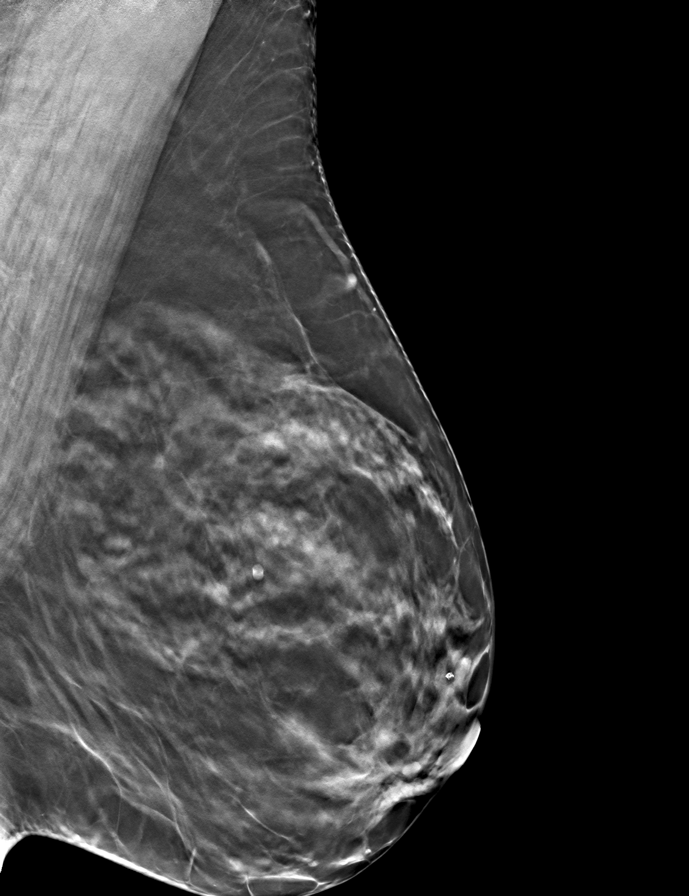

[R CC tomo · tomo slice 35/69.0]
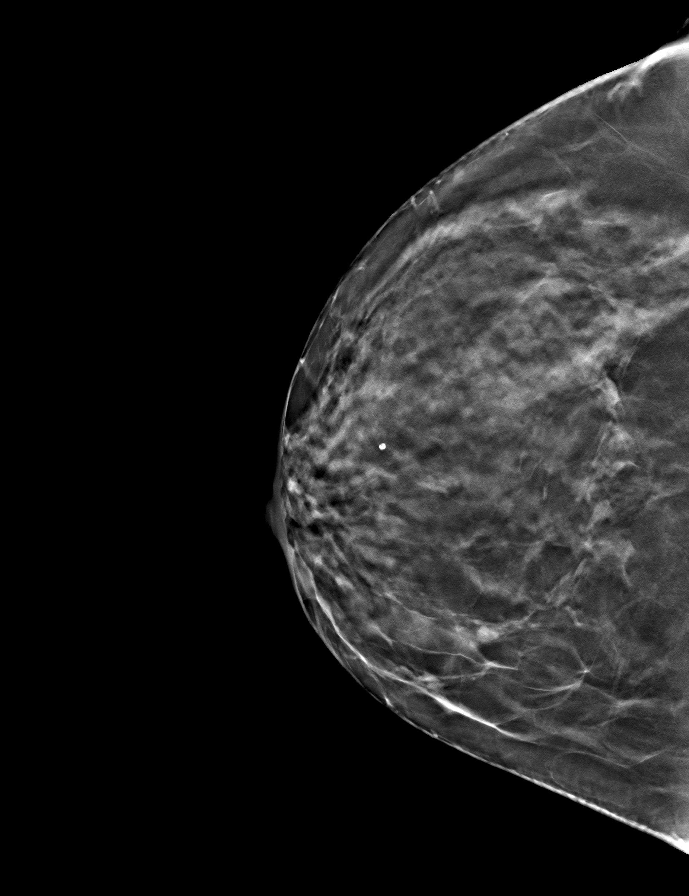

[L CC tomo · tomo slice 36/71.0]
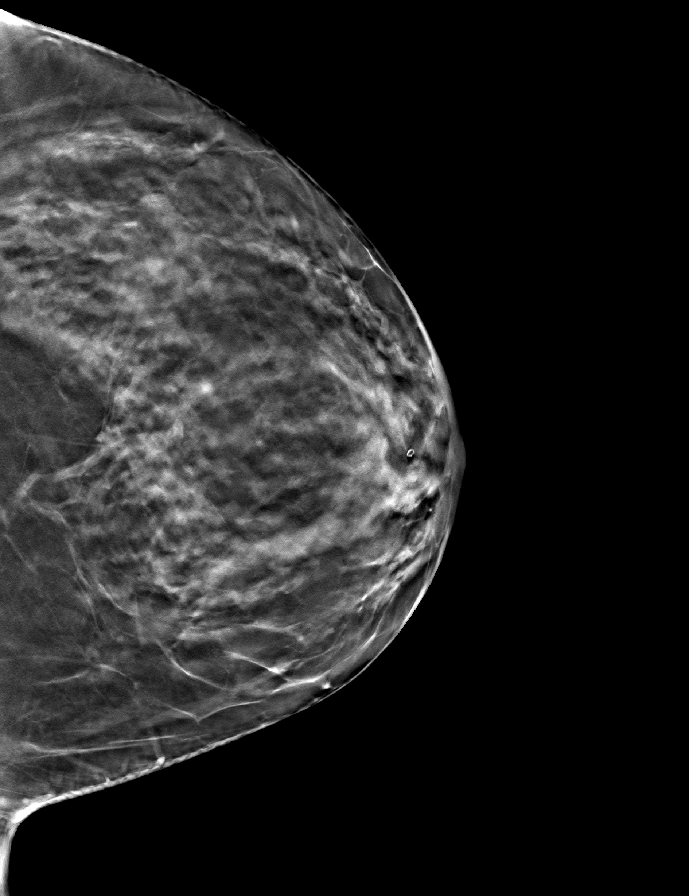

[9 of 24 positions shown; findings below may reference images not displayed]

ACR Breast Density Category c: The breast tissue is heterogeneously
dense, which may obscure small masses.
FINDINGS: There are no findings suspicious for malignancy. Images were
processed with CAD.
IMPRESSION: No mammographic evidence of malignancy. A result letter of this
screening mammogram will be mailed directly to the patient.

RECOMMENDATION:
Screening mammogram in one year. (Code:[SM])

BI-RADS CATEGORY  1: Negative.

## 2014-08-29 NOTE — Telephone Encounter (Signed)
Done

## 2014-08-29 NOTE — Telephone Encounter (Signed)
Pt has physical with pap set for 7/7 wants a full panel to include  Cholesterol an TSH, Vit D, etc ran if we could order that bw please  Pt is aware that we use [facility], she would like a phone call when  We have those orders in.  Last labs   02/25/14 -  Lip, TSH   [phone]

## 2014-08-29 NOTE — Telephone Encounter (Signed)
Patient notified

## 2014-08-31 LAB — HEPATIC FUNCTION PANEL
ALBUMIN: 4.6 g/dL (ref 3.5–5.5)
ALK PHOS: 51 IU/L (ref 39–117)
ALT: 17 IU/L (ref 0–32)
AST: 22 IU/L (ref 0–40)
BILIRUBIN, DIRECT: 0.09 mg/dL (ref 0.00–0.40)
Bilirubin Total: 0.4 mg/dL (ref 0.0–1.2)
TOTAL PROTEIN: 6.9 g/dL (ref 6.0–8.5)

## 2014-08-31 LAB — TSH: TSH: 1.42 u[IU]/mL (ref 0.450–4.500)

## 2014-08-31 LAB — LIPID PANEL
CHOL/HDL RATIO: 2.8 ratio (ref 0.0–4.4)
Cholesterol, Total: 226 mg/dL — ABNORMAL HIGH (ref 100–199)
HDL: 80 mg/dL (ref >39)
LDL CALC: 132 mg/dL — AB (ref 0–99)
TRIGLYCERIDES: 72 mg/dL (ref 0–149)
VLDL Cholesterol Cal: 14 mg/dL (ref 5–40)

## 2014-08-31 LAB — BASIC METABOLIC PANEL
BUN/Creatinine Ratio: 21 (ref 9–23)
BUN: 14 mg/dL (ref 6–24)
CALCIUM: 9.1 mg/dL (ref 8.7–10.2)
CHLORIDE: 101 mmol/L (ref 97–108)
CO2: 21 mmol/L (ref 18–29)
Creatinine, Ser: 0.68 mg/dL (ref 0.57–1.00)
GFR calc Af Amer: 115 mL/min/{1.73_m2} (ref >59)
GFR calc non Af Amer: 99 mL/min/{1.73_m2} (ref >59)
GLUCOSE: 95 mg/dL (ref 65–99)
POTASSIUM: 5 mmol/L (ref 3.5–5.2)
SODIUM: 140 mmol/L (ref 134–144)

## 2014-08-31 LAB — VITAMIN D 25 HYDROXY (VIT D DEFICIENCY, FRACTURES): VIT D 25 HYDROXY: 24.4 ng/mL — AB (ref 30.0–100.0)

## 2014-09-12 ENCOUNTER — Encounter: Payer: 59 | Admitting: Nurse Practitioner

## 2014-09-13 ENCOUNTER — Ambulatory Visit (INDEPENDENT_AMBULATORY_CARE_PROVIDER_SITE_OTHER): Payer: 59 | Admitting: Nurse Practitioner

## 2014-09-13 ENCOUNTER — Encounter: Payer: Self-pay | Admitting: Nurse Practitioner

## 2014-09-13 VITALS — BP 122/80 | Ht 65.5 in | Wt 172.1 lb

## 2014-09-13 DIAGNOSIS — Z124 Encounter for screening for malignant neoplasm of cervix: Secondary | ICD-10-CM | POA: Diagnosis not present

## 2014-09-13 DIAGNOSIS — Z0000 Encounter for general adult medical examination without abnormal findings: Secondary | ICD-10-CM

## 2014-09-13 DIAGNOSIS — Z01419 Encounter for gynecological examination (general) (routine) without abnormal findings: Secondary | ICD-10-CM

## 2014-09-15 NOTE — Progress Notes (Signed)
   Subjective:    Patient ID: Amy Shelton, female    DOB: 12-03-1960, 54 y.o.   MRN: [id number]  HPI presents for her wellness physical. No vaginal bleeding or pelvic pain. Married, same sexual partner. Regular vision and dental exams. Very healthy diet. Just started back on regular exercise regimen. Yearly skin cancer screening.     Review of Systems  Constitutional: Negative for fever, activity change, appetite change and fatigue.  HENT: Negative for dental problem, ear pain, sinus pressure and sore throat.   Respiratory: Negative for cough, chest tightness, shortness of breath and wheezing.   Cardiovascular: Negative for chest pain.  Gastrointestinal: Negative for nausea, vomiting, abdominal pain, diarrhea, constipation, blood in stool and abdominal distention.  Genitourinary: Negative for dysuria, urgency, frequency, vaginal bleeding, vaginal discharge, enuresis, difficulty urinating, genital sores and pelvic pain.       Objective:   Physical Exam  Constitutional: She is oriented to person, place, and time. She appears well-developed. No distress.  HENT:  Right Ear: External ear normal.  Left Ear: External ear normal.  Mouth/Throat: Oropharynx is clear and moist.  Neck: Normal range of motion. Neck supple. No tracheal deviation present. No thyromegaly present.  Cardiovascular: Normal rate, regular rhythm and normal heart sounds.  Exam reveals no gallop.   No murmur heard. Pulmonary/Chest: Effort normal and breath sounds normal.  Abdominal: Soft. She exhibits no distension. There is no tenderness.  Genitourinary: Vagina normal and uterus normal. No vaginal discharge found.  External GU: no rashes or lesions. Vagina slightly pale. No discharge. Cervix normal in appearance. Sclerosis at os; some difficutly obtaining PAP smear. Bimanual exam: no CMT; no tenderness or obvious masses. Rectal exam: no masses; no stool for hemoccult.   Musculoskeletal: She exhibits no edema.    Lymphadenopathy:    She has no cervical adenopathy.  Neurological: She is alert and oriented to person, place, and time.  Skin: Skin is warm and dry. No rash noted.  Psychiatric: She has a normal mood and affect. Her behavior is normal.  Vitals reviewed. Breast exam: dense tissue; multiple fine nodularity; no dominant masses; axillae no adenopathy.  Labs reviewed with patient from 6/24.       Assessment & Plan:  Well woman exam - Plan: Pap IG w/ reflex to HPV when ASC-U  Screening for cervical cancer - Plan: Pap IG w/ reflex to HPV when ASC-U  Recommend daily vitamin D and calcium.  Return in about 1 year (around 09/13/2015) for physical.

## 2014-09-16 LAB — PAP IG W/ RFLX HPV ASCU: PAP SMEAR COMMENT: 0

## 2014-12-18 ENCOUNTER — Ambulatory Visit (INDEPENDENT_AMBULATORY_CARE_PROVIDER_SITE_OTHER): Payer: 59

## 2014-12-18 DIAGNOSIS — Z23 Encounter for immunization: Secondary | ICD-10-CM

## 2015-03-05 ENCOUNTER — Other Ambulatory Visit: Payer: Self-pay | Admitting: *Deleted

## 2015-03-05 MED ORDER — ALBUTEROL SULFATE HFA 108 (90 BASE) MCG/ACT IN AERS: 2.0000 | INHALATION_SPRAY | Freq: Four times a day (QID) | RESPIRATORY_TRACT | Status: DC | PRN

## 2015-03-05 MED ORDER — AZITHROMYCIN 250 MG PO TABS: ORAL_TABLET | ORAL | Status: DC

## 2015-03-13 MED FILL — LEVOTHYROXINE 100 MCG TAB: 100 | 90 days supply | Qty: 90 | Fill #3

## 2015-04-14 ENCOUNTER — Other Ambulatory Visit: Payer: Self-pay

## 2015-04-14 MED ORDER — ONDANSETRON 4 MG PO TBDP: ORAL_TABLET | ORAL | Status: DC

## 2015-04-14 MED FILL — ONDANSETRON ODT 4 MG TABLET: 4 | 4 days supply | Qty: 16 | Fill #0

## 2015-04-29 DIAGNOSIS — B079 Viral wart, unspecified: Secondary | ICD-10-CM | POA: Diagnosis not present

## 2015-04-29 DIAGNOSIS — L570 Actinic keratosis: Secondary | ICD-10-CM | POA: Diagnosis not present

## 2015-04-29 DIAGNOSIS — Z1283 Encounter for screening for malignant neoplasm of skin: Secondary | ICD-10-CM | POA: Diagnosis not present

## 2015-04-29 DIAGNOSIS — D225 Melanocytic nevi of trunk: Secondary | ICD-10-CM | POA: Diagnosis not present

## 2015-04-29 DIAGNOSIS — L918 Other hypertrophic disorders of the skin: Secondary | ICD-10-CM | POA: Diagnosis not present

## 2015-04-29 DIAGNOSIS — X32XXXD Exposure to sunlight, subsequent encounter: Secondary | ICD-10-CM | POA: Diagnosis not present

## 2015-06-09 ENCOUNTER — Other Ambulatory Visit: Payer: Self-pay | Admitting: Nurse Practitioner

## 2015-06-09 MED FILL — LEVOTHYROXINE 100 MCG TAB: 100 | 90 days supply | Qty: 90 | Fill #0

## 2015-08-05 ENCOUNTER — Telehealth: Payer: Self-pay | Admitting: Family Medicine

## 2015-08-05 DIAGNOSIS — Z139 Encounter for screening, unspecified: Secondary | ICD-10-CM

## 2015-08-05 DIAGNOSIS — E039 Hypothyroidism, unspecified: Secondary | ICD-10-CM

## 2015-08-05 NOTE — Telephone Encounter (Signed)
[facility] [place] 08/05/15

## 2015-08-05 NOTE — Telephone Encounter (Signed)
Patient has schedule physical for July 12 and would like labs done. She would like to do labs end of June .

## 2015-08-05 NOTE — Telephone Encounter (Signed)
Lipid, liver, metabolic 7, TSH, vitamin D, CBC

## 2015-08-05 NOTE — Telephone Encounter (Signed)
Spoke with patient and informed her per Dr.Scott Dunsmore- Labs have been ordered. Patient verbalized understanding. 

## 2015-08-13 ENCOUNTER — Other Ambulatory Visit: Payer: Self-pay | Admitting: Nurse Practitioner

## 2015-08-13 DIAGNOSIS — Z1231 Encounter for screening mammogram for malignant neoplasm of breast: Secondary | ICD-10-CM

## 2015-09-03 ENCOUNTER — Ambulatory Visit (HOSPITAL_COMMUNITY): Payer: 59

## 2015-09-04 ENCOUNTER — Ambulatory Visit (HOSPITAL_COMMUNITY)
Admission: RE | Admit: 2015-09-04 | Discharge: 2015-09-04 | Disposition: A | Discharge status: Home / Self Care | Payer: 59 | Source: Ambulatory Visit | Attending: Nurse Practitioner | Admitting: Nurse Practitioner

## 2015-09-04 DIAGNOSIS — Z1231 Encounter for screening mammogram for malignant neoplasm of breast: Secondary | ICD-10-CM | POA: Diagnosis not present

## 2015-09-04 DIAGNOSIS — R928 Other abnormal and inconclusive findings on diagnostic imaging of breast: Secondary | ICD-10-CM | POA: Insufficient documentation

## 2015-09-08 DIAGNOSIS — Z139 Encounter for screening, unspecified: Secondary | ICD-10-CM | POA: Diagnosis not present

## 2015-09-08 DIAGNOSIS — E039 Hypothyroidism, unspecified: Secondary | ICD-10-CM | POA: Diagnosis not present

## 2015-09-09 LAB — BASIC METABOLIC PANEL
BUN/Creatinine Ratio: 22 (ref 9–23)
BUN: 17 mg/dL (ref 6–24)
CALCIUM: 9.4 mg/dL (ref 8.7–10.2)
CO2: 23 mmol/L (ref 18–29)
CREATININE: 0.77 mg/dL (ref 0.57–1.00)
Chloride: 98 mmol/L (ref 96–106)
GFR calc Af Amer: 101 mL/min/{1.73_m2} (ref >59)
GFR, EST NON AFRICAN AMERICAN: 87 mL/min/{1.73_m2} (ref >59)
Glucose: 100 mg/dL — ABNORMAL HIGH (ref 65–99)
POTASSIUM: 4.5 mmol/L (ref 3.5–5.2)
Sodium: 133 mmol/L — ABNORMAL LOW (ref 134–144)

## 2015-09-09 LAB — CBC WITH DIFFERENTIAL/PLATELET
BASOS ABS: 0 10*3/uL (ref 0.0–0.2)
Basos: 1 %
EOS (ABSOLUTE): 0.2 10*3/uL (ref 0.0–0.4)
Eos: 4 %
Hematocrit: 39.8 % (ref 34.0–46.6)
Hemoglobin: 13.9 g/dL (ref 11.1–15.9)
LYMPHS: 34 %
Lymphocytes Absolute: 1.8 10*3/uL (ref 0.7–3.1)
MCH: 32.9 pg (ref 26.6–33.0)
MCHC: 34.9 g/dL (ref 31.5–35.7)
MCV: 94 fL (ref 79–97)
MONOCYTES: 11 %
Monocytes Absolute: 0.6 10*3/uL (ref 0.1–0.9)
Neutrophils Absolute: 2.7 10*3/uL (ref 1.4–7.0)
Neutrophils: 50 %
PLATELETS: 303 10*3/uL (ref 150–379)
RBC: 4.22 x10E6/uL (ref 3.77–5.28)
RDW: 12.9 % (ref 12.3–15.4)
WBC: 5.2 10*3/uL (ref 3.4–10.8)

## 2015-09-09 LAB — LIPID PANEL
Chol/HDL Ratio: 2.8 ratio units (ref 0.0–4.4)
Cholesterol, Total: 244 mg/dL — ABNORMAL HIGH (ref 100–199)
HDL: 87 mg/dL (ref >39)
LDL CALC: 136 mg/dL — AB (ref 0–99)
Triglycerides: 107 mg/dL (ref 0–149)
VLDL CHOLESTEROL CAL: 21 mg/dL (ref 5–40)

## 2015-09-09 LAB — HEPATIC FUNCTION PANEL
ALBUMIN: 4.6 g/dL (ref 3.5–5.5)
ALT: 14 IU/L (ref 0–32)
AST: 21 IU/L (ref 0–40)
Alkaline Phosphatase: 59 IU/L (ref 39–117)
Bilirubin Total: 0.5 mg/dL (ref 0.0–1.2)
Bilirubin, Direct: 0.1 mg/dL (ref 0.00–0.40)
TOTAL PROTEIN: 7.3 g/dL (ref 6.0–8.5)

## 2015-09-09 LAB — VITAMIN D 25 HYDROXY (VIT D DEFICIENCY, FRACTURES): VIT D 25 HYDROXY: 30 ng/mL (ref 30.0–100.0)

## 2015-09-09 LAB — TSH: TSH: 3.76 u[IU]/mL (ref 0.450–4.500)

## 2015-09-10 ENCOUNTER — Other Ambulatory Visit: Payer: Self-pay | Admitting: Nurse Practitioner

## 2015-09-10 DIAGNOSIS — R928 Other abnormal and inconclusive findings on diagnostic imaging of breast: Secondary | ICD-10-CM

## 2015-09-17 ENCOUNTER — Ambulatory Visit
Admission: RE | Admit: 2015-09-17 | Discharge: 2015-09-17 | Disposition: A | Discharge status: Home / Self Care | Payer: 59 | Source: Ambulatory Visit | Attending: Nurse Practitioner | Admitting: Nurse Practitioner

## 2015-09-17 ENCOUNTER — Encounter: Payer: Self-pay | Admitting: Nurse Practitioner

## 2015-09-17 ENCOUNTER — Other Ambulatory Visit: Payer: 59

## 2015-09-17 ENCOUNTER — Ambulatory Visit (INDEPENDENT_AMBULATORY_CARE_PROVIDER_SITE_OTHER): Payer: 59 | Admitting: Nurse Practitioner

## 2015-09-17 VITALS — BP 126/78 | Ht 66.0 in | Wt 165.1 lb

## 2015-09-17 DIAGNOSIS — N6489 Other specified disorders of breast: Secondary | ICD-10-CM | POA: Diagnosis not present

## 2015-09-17 DIAGNOSIS — R928 Other abnormal and inconclusive findings on diagnostic imaging of breast: Secondary | ICD-10-CM

## 2015-09-17 DIAGNOSIS — R7301 Impaired fasting glucose: Secondary | ICD-10-CM

## 2015-09-17 DIAGNOSIS — E039 Hypothyroidism, unspecified: Secondary | ICD-10-CM

## 2015-09-17 DIAGNOSIS — Z01419 Encounter for gynecological examination (general) (routine) without abnormal findings: Secondary | ICD-10-CM

## 2015-09-17 DIAGNOSIS — R922 Inconclusive mammogram: Secondary | ICD-10-CM | POA: Diagnosis not present

## 2015-09-17 DIAGNOSIS — Z139 Encounter for screening, unspecified: Secondary | ICD-10-CM | POA: Diagnosis not present

## 2015-09-17 DIAGNOSIS — Z0000 Encounter for general adult medical examination without abnormal findings: Secondary | ICD-10-CM

## 2015-09-17 LAB — POCT GLYCOSYLATED HEMOGLOBIN (HGB A1C): Hemoglobin A1C: 5.6

## 2015-09-17 IMAGING — US ULTRASOUND LEFT BREAST LIMITED
1 series · 4 of 4 positions shown · non-contrast
Comparison: Previous exam(s).

CLINICAL DATA: Screening recall for possible left breast mass.

EXAM:
2D DIGITAL DIAGNOSTIC UNILATERAL LEFT MAMMOGRAM WITH CAD AND ADJUNCT
TOMO
LEFT BREAST ULTRASOUND

[Series 1: ultrasound left breast limited · 0.06mm/px · 4 of 4 slices shown]
[im 1/4]
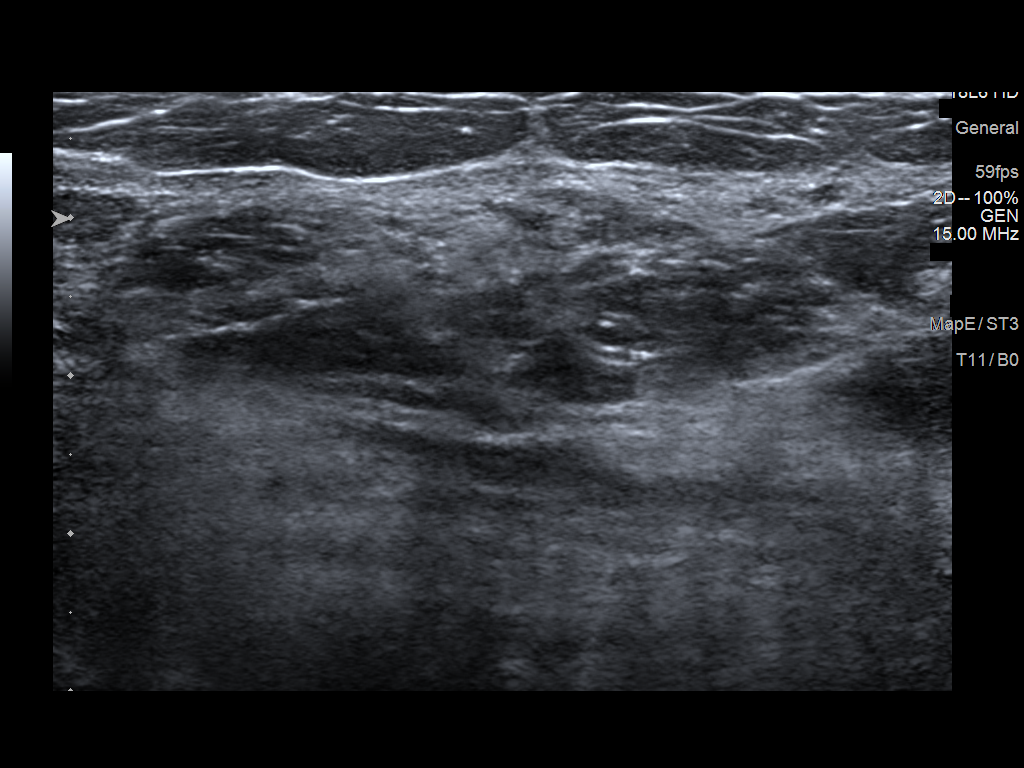
[im 2/4]
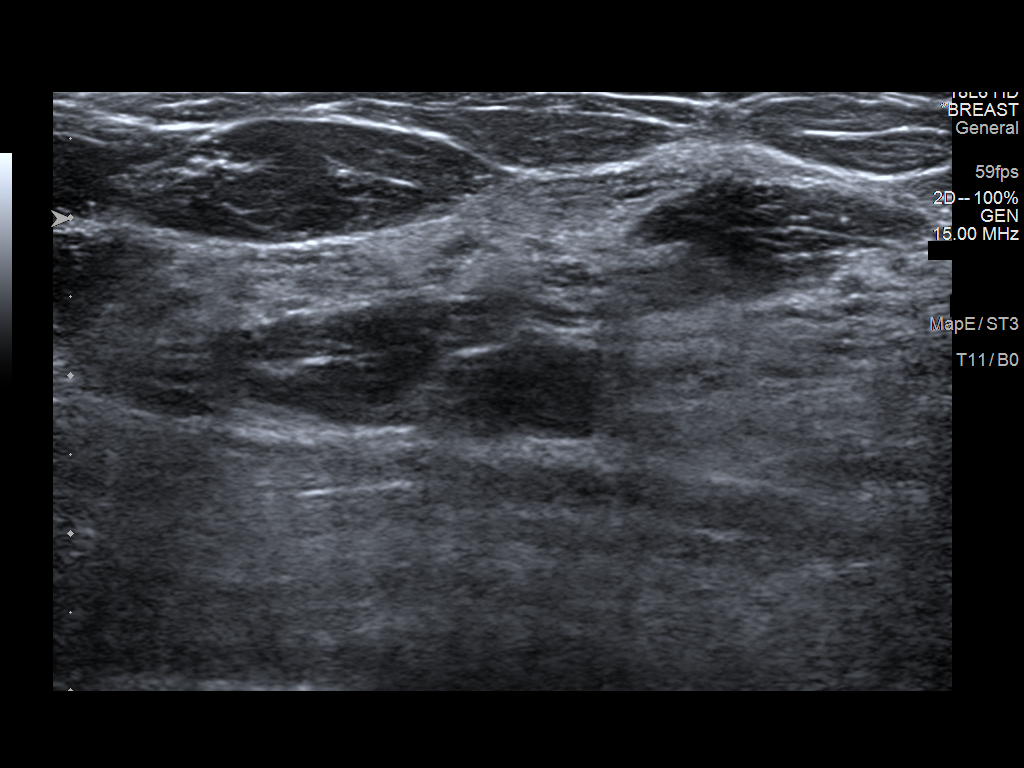
[im 3/4]
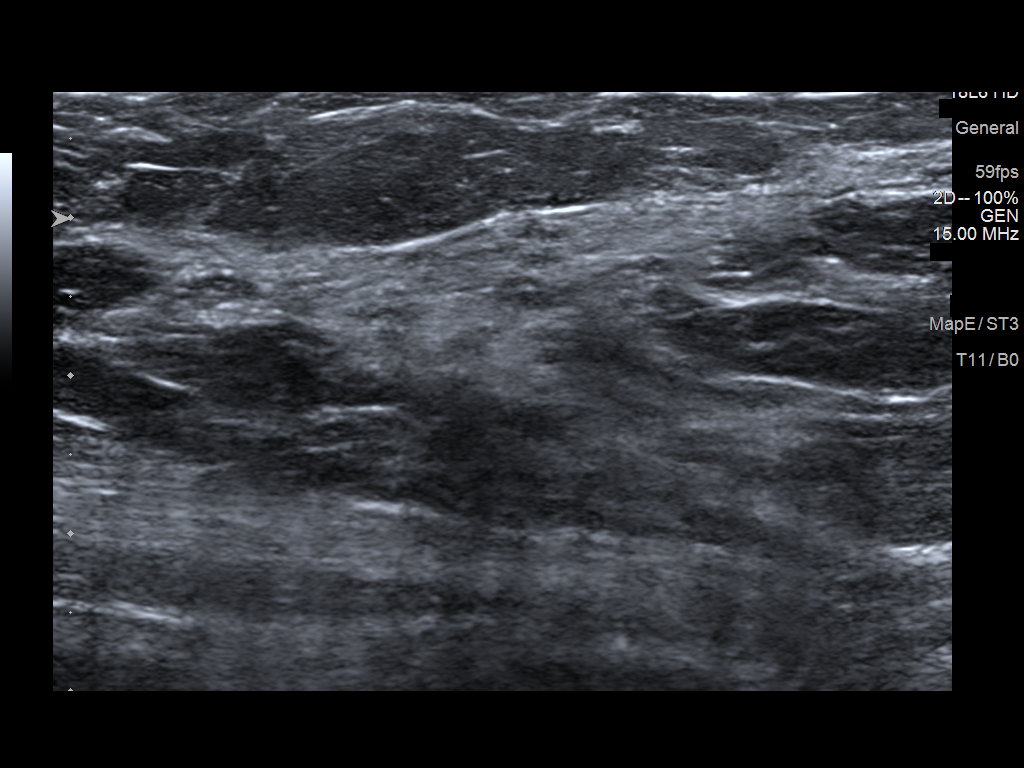
[im 4/4]
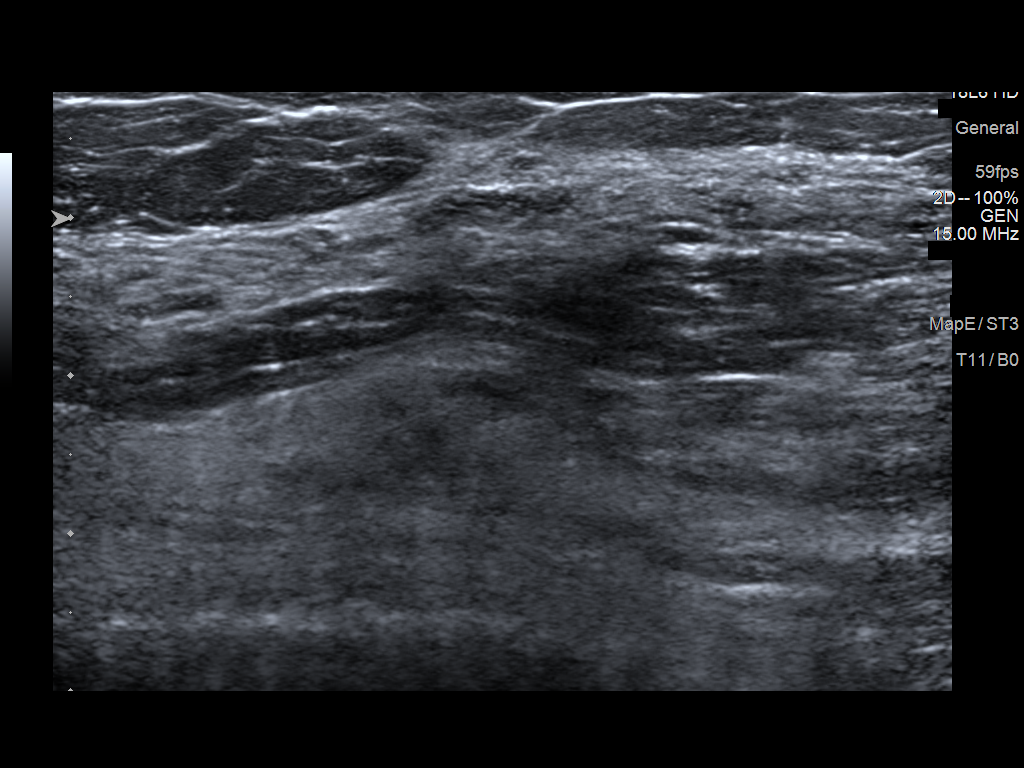

[4 of 4 positions shown; findings below may reference images not displayed]

ACR Breast Density Category c: The breast tissue is heterogeneously
dense, which may obscure small masses.
FINDINGS: There is a persistent asymmetry in the superior left breast without
a correlate on the CC view. On the tomosynthesis images, as no
underlying mass is identified. On the full field true lateral view,
the tissue does not look significantly different from multiple prior
mammograms. Due to the tissue density, ultrasound will be performed
for further evaluation.

Mammographic images were processed with CAD.

Physical exam of the superior left breast demonstrates no suspicious
palpable masses.

Ultrasound targeted to the superior left breast which was scanned
from the 10 o'clock to the 1 o'clock position demonstrates normal
fibroglandular tissue. No masses or suspicious areas of shadowing
are identified.
IMPRESSION: The asymmetry in the superior left breast is probably benign, likely
representing normal fibroglandular tissue.

RECOMMENDATION:
Six-month follow-up diagnostic left breast mammogram is recommended.

I have discussed the findings and recommendations with the patient.
Results were also provided in writing at the conclusion of the
visit. If applicable, a reminder letter will be sent to the patient
regarding the next appointment.

BI-RADS CATEGORY  3: Probably benign.

## 2015-09-17 MED ORDER — LEVOTHYROXINE SODIUM 100 MCG PO TABS: ORAL_TABLET | ORAL | Status: DC

## 2015-09-17 MED ORDER — ALBUTEROL SULFATE HFA 108 (90 BASE) MCG/ACT IN AERS: 2.0000 | INHALATION_SPRAY | RESPIRATORY_TRACT | Status: DC | PRN

## 2015-09-17 MED FILL — VENTOLIN HFA 90 MCG INHALER: 108 (90 BAS | 16 days supply | Qty: 18 | Fill #0

## 2015-09-17 MED FILL — LEVOTHYROXINE 100 MCG TAB: 100 | 90 days supply | Qty: 90 | Fill #0

## 2015-09-17 NOTE — Progress Notes (Signed)
Subjective:    Patient ID: Amy Shelton, female    DOB: 05/03/1960, 55 y.o.   MRN: [id number]  HPI presents for her wellness exam. Married, same sexual partner. No vaginal bleeding or pelvic pain. Healthy diet. Very active. Regular vision and dental exams. Has an appointment today for diagnostic mammo and US of the left breast. Takes vit D and calcium, not every day.     Review of Systems  Constitutional: Negative for activity change, appetite change and fatigue.  HENT: Negative for dental problem, ear pain, sinus pressure and sore throat.   Respiratory: Positive for wheezing. Negative for cough, chest tightness and shortness of breath.        Rare wheezing. Better with albuterol inhaler.   Cardiovascular: Negative for chest pain.  Gastrointestinal: Negative for nausea, vomiting, abdominal pain, diarrhea, constipation and abdominal distention.  Genitourinary: Negative for dysuria, urgency, frequency, vaginal bleeding, vaginal discharge, enuresis, difficulty urinating, genital sores and pelvic pain.       Objective:   Physical Exam  Constitutional: She is oriented to person, place, and time. She appears well-developed. No distress.  HENT:  Right Ear: External ear normal.  Left Ear: External ear normal.  Mouth/Throat: Oropharynx is clear and moist.  Neck: Normal range of motion. Neck supple. No tracheal deviation present. No thyromegaly present.  Cardiovascular: Normal rate, regular rhythm and normal heart sounds.  Exam reveals no gallop.   No murmur heard. Pulmonary/Chest: Effort normal and breath sounds normal.  Abdominal: Soft. She exhibits no distension. There is no tenderness.  Genitourinary: Vagina normal and uterus normal. No vaginal discharge found.  External GU: No rashes or lesions. Vagina: no discharge. No CMT. Cervix normal in appearance bimanual exam: no tenderness or obvious masses. Rectal exam: no masses; no stool for hemoccult.   Musculoskeletal: She exhibits no  edema.  Lymphadenopathy:    She has no cervical adenopathy.  Neurological: She is alert and oriented to person, place, and time.  Skin: Skin is warm and dry. No rash noted.  Psychiatric: She has a normal mood and affect. Her behavior is normal.  Vitals reviewed. Breast exam: dense tissue; minimal fine nodularity; no masses; axillae no adenopathy.  Reviewed labs with patient from 09/08/15.       Assessment & Plan:   Problem List Items Addressed This Visit      Endocrine   Hypothyroidism   Relevant Medications   levothyroxine (SYNTHROID, LEVOTHROID) 100 MCG tablet   Other Relevant Orders   TSH    Other Visit Diagnoses    Well woman exam    -  Primary    Relevant Orders    POC Hemoccult Bld/Stl (3-Cd Home Screen)    HIV antibody (with reflex)    Hepatitis C Antibody    Elevated fasting glucose        Relevant Orders    POCT HgB A1C (Completed)    Screening        Relevant Orders    POC Hemoccult Bld/Stl (3-Cd Home Screen)    HIV antibody (with reflex)    Hepatitis C Antibody      Repeat TSH in 3-4 months along with recommended screening.  Return in about 1 year (around 09/16/2016) for physical. Meds ordered this encounter  Medications  . albuterol (PROVENTIL HFA;VENTOLIN HFA) 108 (90 Base) MCG/ACT inhaler    Sig: Inhale 2 puffs into the lungs every 4 (four) hours as needed for wheezing or shortness of breath.    Dispense:  1  Inhaler    Refill:  5    Order Specific Question:  Supervising Provider    Answer:  Mikey Kirschner [[id number]]  . levothyroxine (SYNTHROID, LEVOTHROID) 100 MCG tablet    Sig: TAKE 1 TABLET BY MOUTH DAILY BEFORE BREAKFAST.    Dispense:  90 tablet    Refill:  1    Order Specific Question:  Supervising Provider    Answer:  Maggie Font

## 2015-12-03 MED FILL — LEVOTHYROXINE 100 MCG TAB: 100 | 90 days supply | Qty: 90 | Fill #1

## 2016-01-26 DIAGNOSIS — E039 Hypothyroidism, unspecified: Secondary | ICD-10-CM | POA: Diagnosis not present

## 2016-01-26 DIAGNOSIS — Z0000 Encounter for general adult medical examination without abnormal findings: Secondary | ICD-10-CM | POA: Diagnosis not present

## 2016-01-26 DIAGNOSIS — Z139 Encounter for screening, unspecified: Secondary | ICD-10-CM | POA: Diagnosis not present

## 2016-01-27 LAB — HIV ANTIBODY (ROUTINE TESTING W REFLEX): HIV SCREEN 4TH GENERATION: NONREACTIVE

## 2016-01-27 LAB — HEPATITIS C ANTIBODY: Hep C Virus Ab: <0.1 s/co ratio (ref 0.0–0.9)

## 2016-01-27 LAB — TSH: TSH: 1.93 u[IU]/mL (ref 0.450–4.500)

## 2016-02-05 ENCOUNTER — Other Ambulatory Visit: Payer: Self-pay

## 2016-02-05 MED ORDER — VALACYCLOVIR HCL 1 G PO TABS: ORAL_TABLET | ORAL | 0 refills | Status: DC

## 2016-02-17 ENCOUNTER — Other Ambulatory Visit: Payer: Self-pay | Admitting: Nurse Practitioner

## 2016-02-17 DIAGNOSIS — N6489 Other specified disorders of breast: Secondary | ICD-10-CM

## 2016-03-03 DIAGNOSIS — H524 Presbyopia: Secondary | ICD-10-CM | POA: Diagnosis not present

## 2016-03-03 DIAGNOSIS — H5201 Hypermetropia, right eye: Secondary | ICD-10-CM | POA: Diagnosis not present

## 2016-03-03 DIAGNOSIS — H52221 Regular astigmatism, right eye: Secondary | ICD-10-CM | POA: Diagnosis not present

## 2016-03-10 ENCOUNTER — Other Ambulatory Visit: Payer: Self-pay | Admitting: Nurse Practitioner

## 2016-03-10 MED FILL — LEVOTHYROXINE 100 MCG TAB: 100 | 90 days supply | Qty: 90 | Fill #0

## 2016-03-23 ENCOUNTER — Other Ambulatory Visit: Payer: 59

## 2016-03-30 ENCOUNTER — Ambulatory Visit
Admission: RE | Admit: 2016-03-30 | Discharge: 2016-03-30 | Disposition: A | Discharge status: Home / Self Care | Payer: 59 | Source: Ambulatory Visit | Attending: Nurse Practitioner | Admitting: Nurse Practitioner

## 2016-03-30 DIAGNOSIS — N6489 Other specified disorders of breast: Secondary | ICD-10-CM

## 2016-03-30 DIAGNOSIS — R922 Inconclusive mammogram: Secondary | ICD-10-CM | POA: Diagnosis not present

## 2016-03-30 IMAGING — MG 2D DIGITAL DIAGNOSTIC UNILATERAL LEFT MAMMOGRAM WITH CAD AND ADJ
6 series · 6 of 14 positions shown · non-contrast
Comparison: Previous exam(s).

CLINICAL DATA: 55-year-old female presenting for six-month
follow-up of a left breast asymmetry.

EXAM:
2D DIGITAL DIAGNOSTIC UNILATERAL LEFT MAMMOGRAM WITH CAD AND ADJUNCT
TOMO

[L CC]
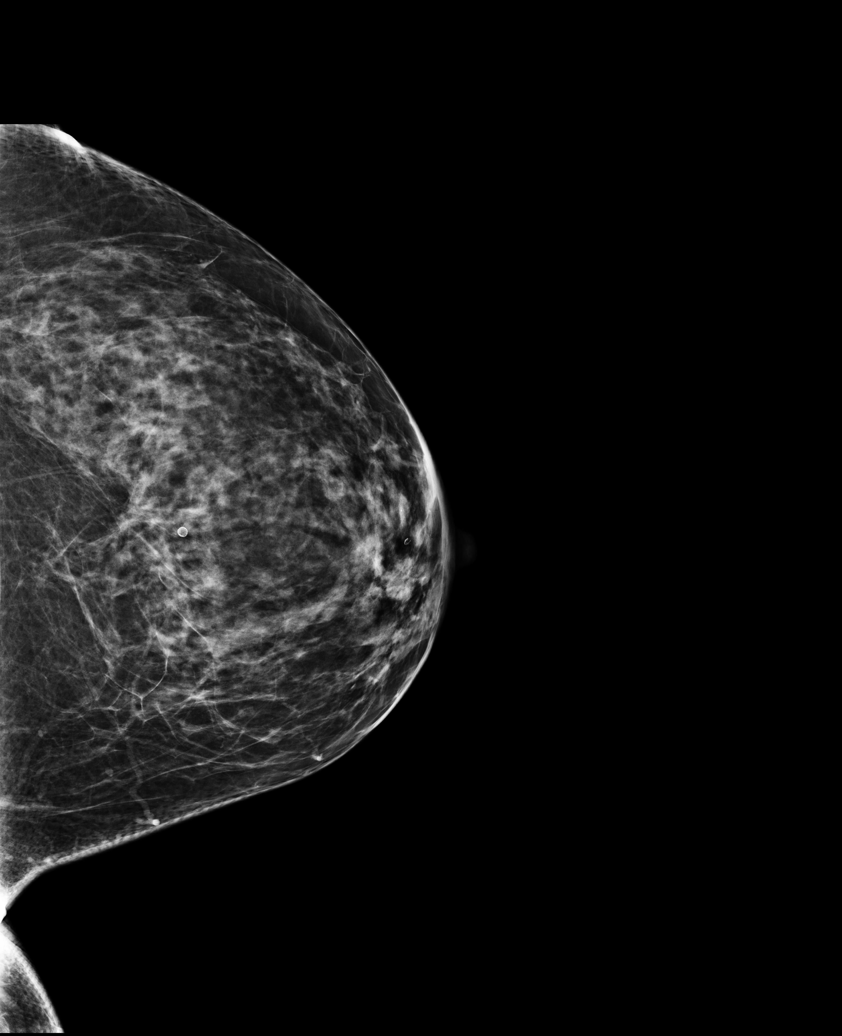

[L MLO]
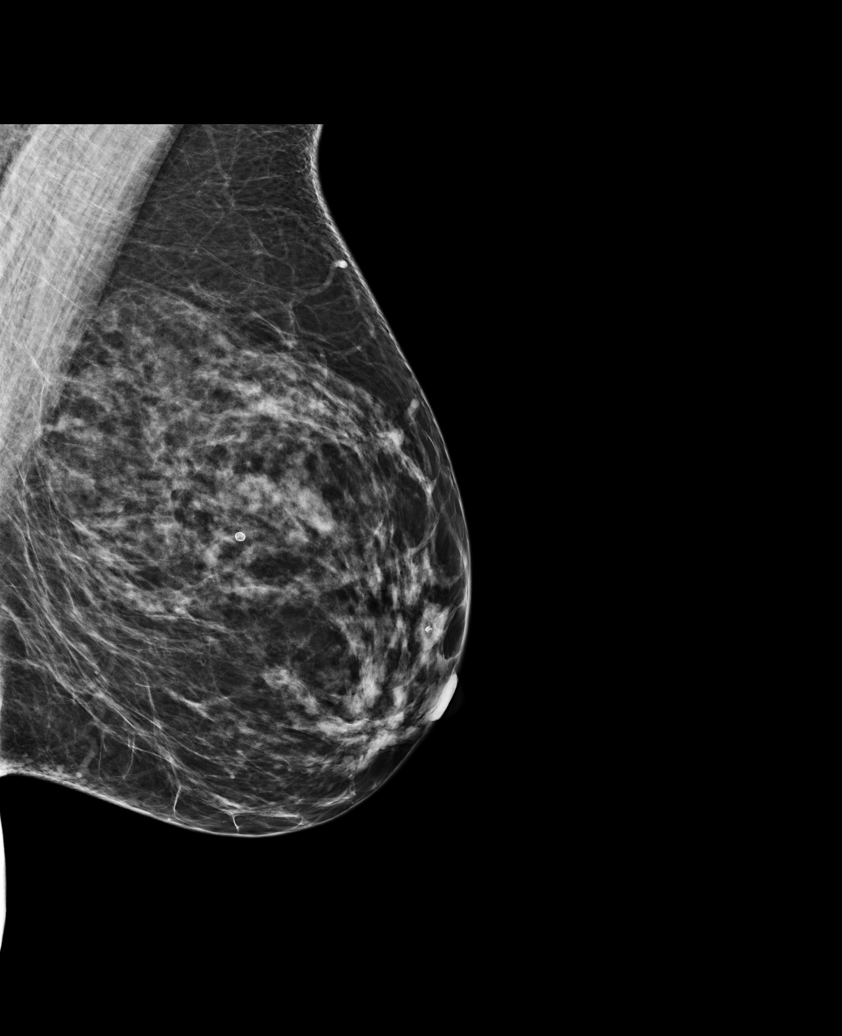

[L CC synth-2D]
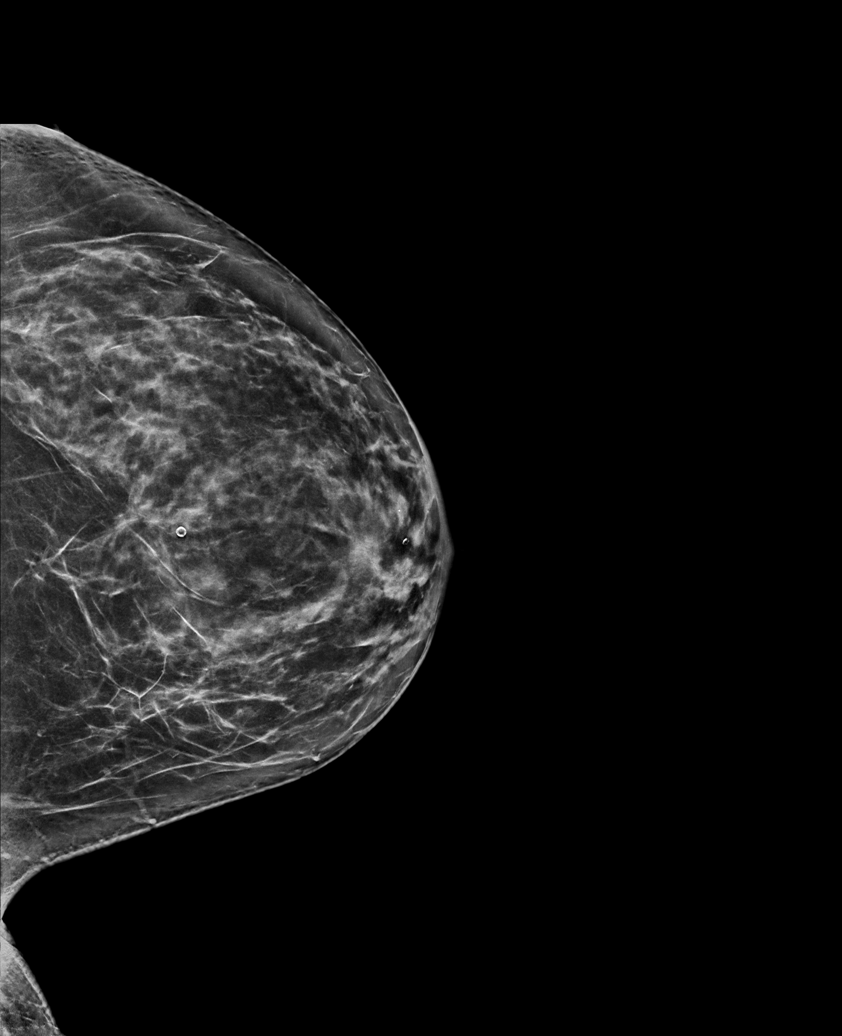

[L MLO synth-2D]
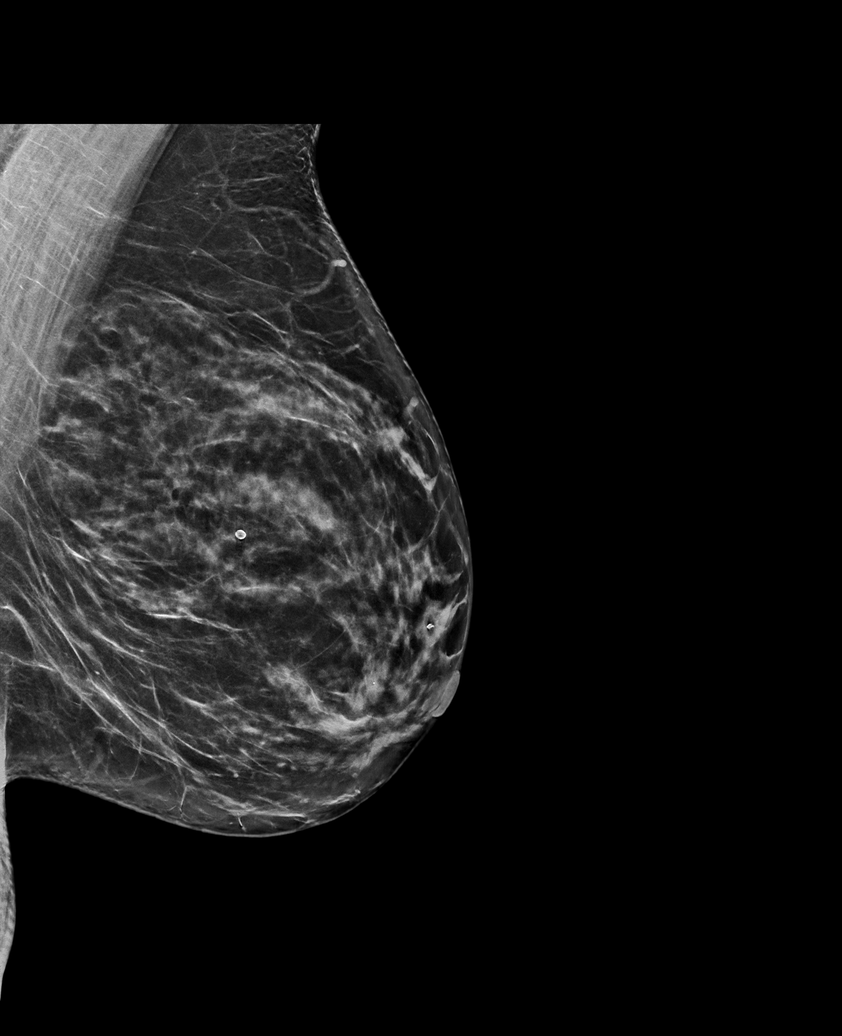

[L CC tomo · tomo slice 41/82.0]
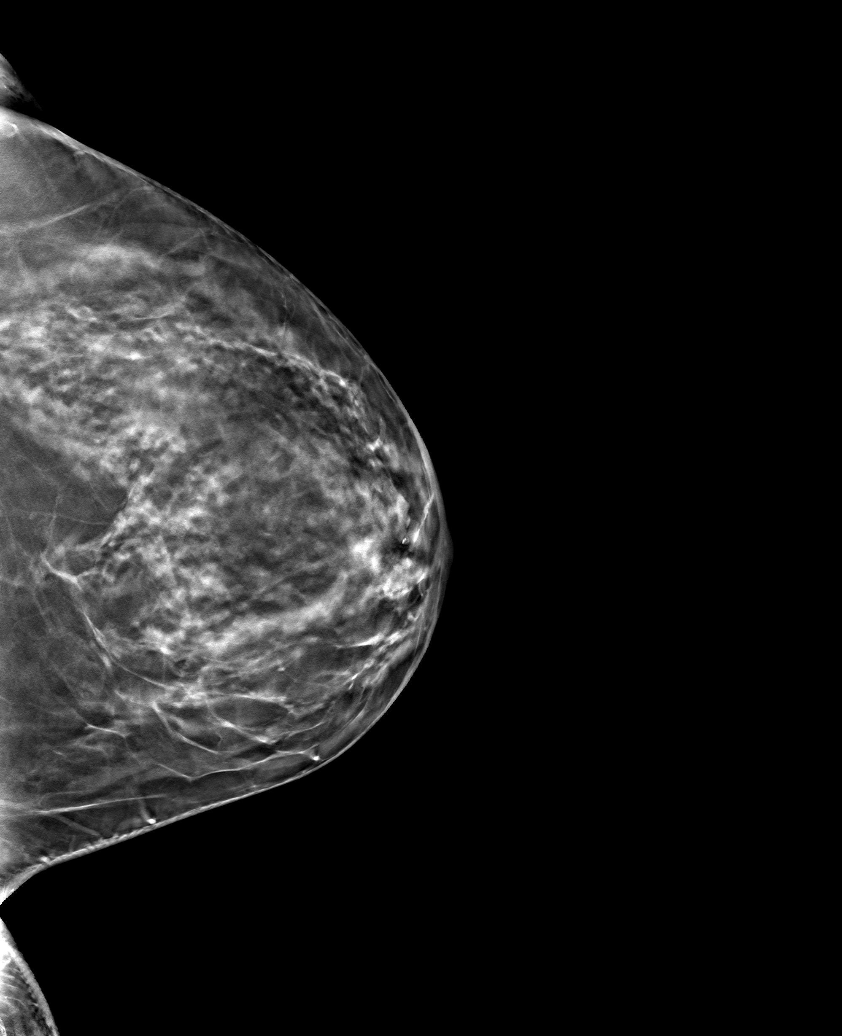

[L MLO tomo · tomo slice 41/82.0]
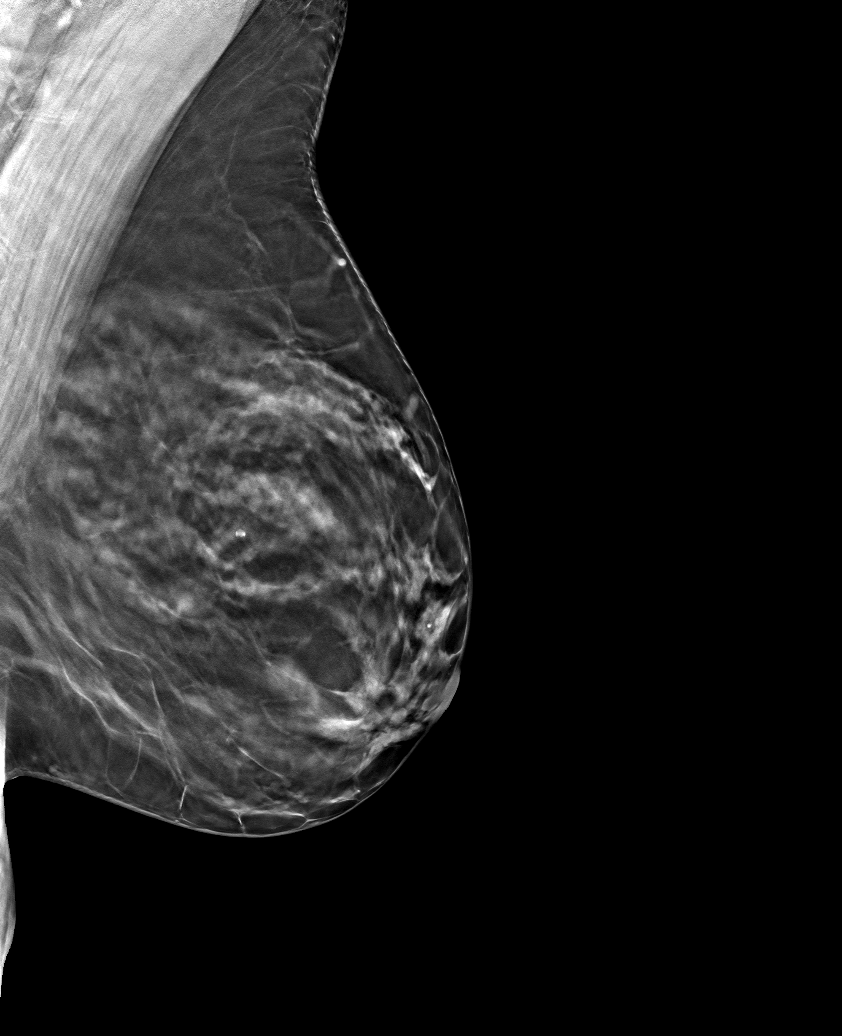

[6 of 14 positions shown; findings below may reference images not displayed]

ACR Breast Density Category c: The breast tissue is heterogeneously
dense, which may obscure small masses.
FINDINGS: The asymmetry of concern in the superior appears stable from the
prior exam. On the MLO tomosynthesis images today, a tortuous blood
vessel and fibroglandular tissue appears to account for the
appearance, and no corresponding abnormality is identified on the CC
view. No suspicious calcifications, masses or areas of distortion
are seen in the left breast.

Mammographic images were processed with CAD.
IMPRESSION: 1. The asymmetry of concern in the left breast corresponds with
normal overlapping tissues. No mammographic evidence of malignancy
in the left breast.

RECOMMENDATION:
Return to routine screening mammography is recommended. The patient
will be due for screening in [DATE].

I have discussed the findings and recommendations with the patient.
Results were also provided in writing at the conclusion of the
visit. If applicable, a reminder letter will be sent to the patient
regarding the next appointment.

BI-RADS CATEGORY  1: Negative.

## 2016-05-04 DIAGNOSIS — D225 Melanocytic nevi of trunk: Secondary | ICD-10-CM | POA: Diagnosis not present

## 2016-05-04 DIAGNOSIS — X32XXXD Exposure to sunlight, subsequent encounter: Secondary | ICD-10-CM | POA: Diagnosis not present

## 2016-05-04 DIAGNOSIS — B078 Other viral warts: Secondary | ICD-10-CM | POA: Diagnosis not present

## 2016-05-04 DIAGNOSIS — Z1283 Encounter for screening for malignant neoplasm of skin: Secondary | ICD-10-CM | POA: Diagnosis not present

## 2016-05-04 DIAGNOSIS — L570 Actinic keratosis: Secondary | ICD-10-CM | POA: Diagnosis not present

## 2016-06-04 MED FILL — LEVOTHYROXINE 100 MCG TABLE: 100 | 90 days supply | Qty: 90 | Fill #1

## 2016-08-16 ENCOUNTER — Encounter: Payer: Self-pay | Admitting: Nurse Practitioner

## 2016-08-16 ENCOUNTER — Ambulatory Visit (INDEPENDENT_AMBULATORY_CARE_PROVIDER_SITE_OTHER): Payer: 59 | Admitting: Nurse Practitioner

## 2016-08-16 VITALS — BP 132/88 | Ht 66.0 in | Wt 179.0 lb

## 2016-08-16 DIAGNOSIS — J329 Chronic sinusitis, unspecified: Secondary | ICD-10-CM | POA: Diagnosis not present

## 2016-08-16 DIAGNOSIS — J310 Chronic rhinitis: Secondary | ICD-10-CM

## 2016-08-16 DIAGNOSIS — J4531 Mild persistent asthma with (acute) exacerbation: Secondary | ICD-10-CM

## 2016-08-16 MED ORDER — FLUTICASONE PROPIONATE HFA 44 MCG/ACT IN AERO: INHALATION_SPRAY | RESPIRATORY_TRACT | 0 refills | Status: DC

## 2016-08-16 MED ORDER — AZITHROMYCIN 250 MG PO TABS: ORAL_TABLET | ORAL | 0 refills | Status: DC

## 2016-08-16 MED FILL — FLOVENT HFA 44 MCG INHALER: 44 | 30 days supply | Qty: 11 | Fill #0

## 2016-08-16 NOTE — Patient Instructions (Signed)
flovent used daily as preventive Albuterol (Ventolin) rescue inhaler; use for active wheezing or before exercise Start Nasacort

## 2016-08-18 ENCOUNTER — Encounter: Payer: Self-pay | Admitting: Nurse Practitioner

## 2016-08-18 NOTE — Progress Notes (Signed)
Subjective:  Presents for c/o wheezing for the past few months worse over the past week after catching a cold. Was using her albuterol inhaler very morning before this, now using twice a day. Cough and wheezing worse at night. No fevers. Has been exposed to mold and dust recently which made symptoms worse. Wears a mask when mowing lawn. Has problems with season change and pollen. Non smoker. PND. Mucus now yellowish in color. No headache, sore throat or ear pain. Uses inhaler before exercise which helps. Takes daily claritin. No acid reflux.   Objective:   BP 132/88   Ht 5\' 6"  (1.676 m)   Wt 179 lb (81.2 kg)   LMP 01/05/2011   BMI 28.89 kg/m  NAD. Alert, oriented. TMs clear effusion, no erythema. Pharynx injected with green PND noted. Neck supple with mild anterior adenopathy. Lungs clear with mildly diminished breath sounds in general. No tachypnea. Normal color. Heart RRR.   Assessment:  Rhinosinusitis  Mild persistent asthma with acute exacerbation    Plan:   Meds ordered this encounter  Medications  . azithromycin (ZITHROMAX Z-PAK) 250 MG tablet    Sig: Take 2 tablets (500 mg) on  Day 1,  followed by 1 tablet (250 mg) once daily on Days 2 through 5.    Dispense:  6 each    Refill:  0    Order Specific Question:   Supervising Provider    Answer:   Mikey Kirschner [[id number]]  . fluticasone (FLOVENT HFA) 44 MCG/ACT inhaler    Sig: Take 2 puffs BID    Dispense:  3 Inhaler    Refill:  0    Order Specific Question:   Supervising Provider    Answer:   Mikey Kirschner [[id number]]   Restart steroid nasal spray. Call back in 3-4 weeks if no improvement, sooner if worse. Warning signs reviewed.

## 2016-09-02 MED FILL — VENTOLIN HFA 90 MCG INHALER: 108 (90 BAS | 16 days supply | Qty: 18 | Fill #1

## 2016-09-03 ENCOUNTER — Other Ambulatory Visit: Payer: Self-pay | Admitting: *Deleted

## 2016-09-03 MED ORDER — LEVOTHYROXINE SODIUM 100 MCG PO TABS: ORAL_TABLET | ORAL | 0 refills | Status: DC

## 2016-09-03 MED FILL — LEVOTHYROXINE 100 MCG TABLE: 100 | 90 days supply | Qty: 90 | Fill #0

## 2016-09-13 MED FILL — FLOVENT HFA 44 MCG INHALER: 44 | 30 days supply | Qty: 11 | Fill #1

## 2016-09-27 ENCOUNTER — Other Ambulatory Visit: Payer: Self-pay | Admitting: Nurse Practitioner

## 2016-09-27 DIAGNOSIS — Z1231 Encounter for screening mammogram for malignant neoplasm of breast: Secondary | ICD-10-CM

## 2016-09-30 ENCOUNTER — Ambulatory Visit
Admission: RE | Admit: 2016-09-30 | Discharge: 2016-09-30 | Disposition: A | Discharge status: Home / Self Care | Payer: 59 | Source: Ambulatory Visit | Attending: Nurse Practitioner | Admitting: Nurse Practitioner

## 2016-09-30 DIAGNOSIS — Z1231 Encounter for screening mammogram for malignant neoplasm of breast: Secondary | ICD-10-CM | POA: Diagnosis not present

## 2016-09-30 IMAGING — MG 2D DIGITAL SCREENING BILATERAL MAMMOGRAM WITH CAD AND ADJUNCT TO
8 of 12 series · 8 of 28 positions shown · non-contrast
Comparison: Previous exam(s).

CLINICAL DATA: Screening.

EXAM:
2D DIGITAL SCREENING BILATERAL MAMMOGRAM WITH CAD AND ADJUNCT TOMO

[R MLO synth-2D]
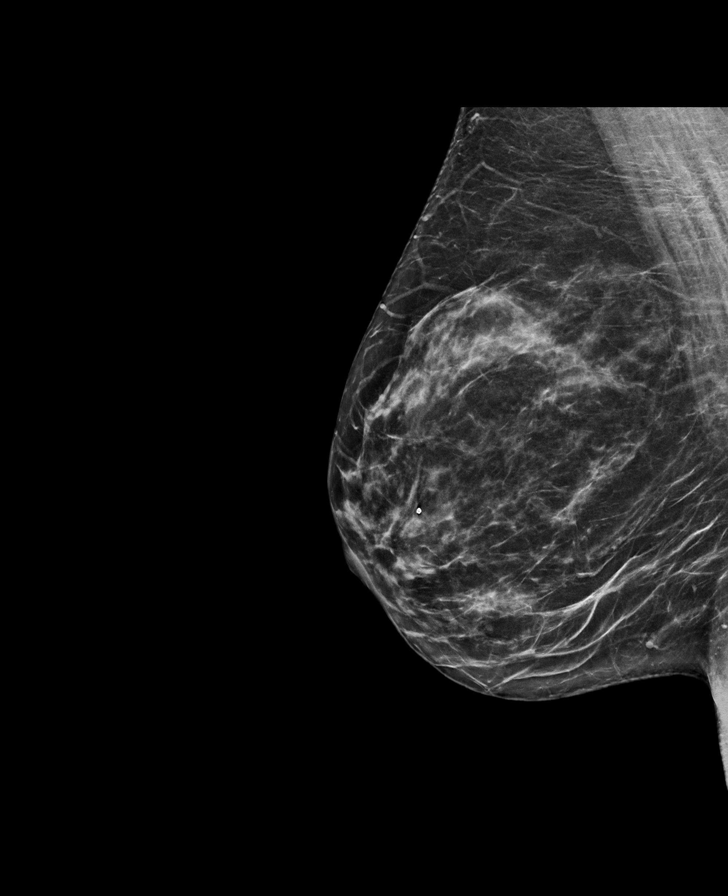

[L MLO]
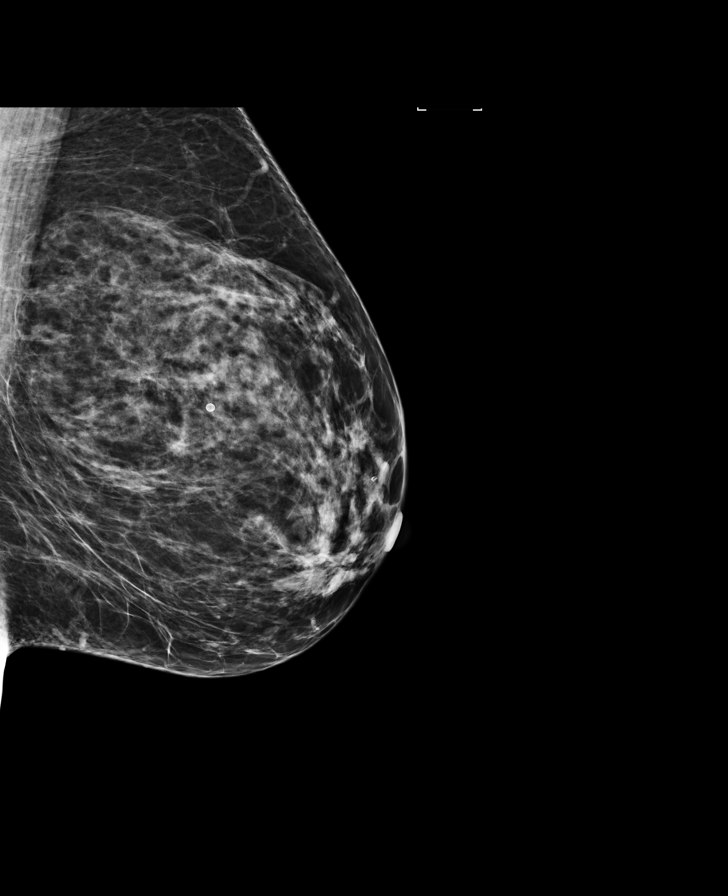

[R CC synth-2D]
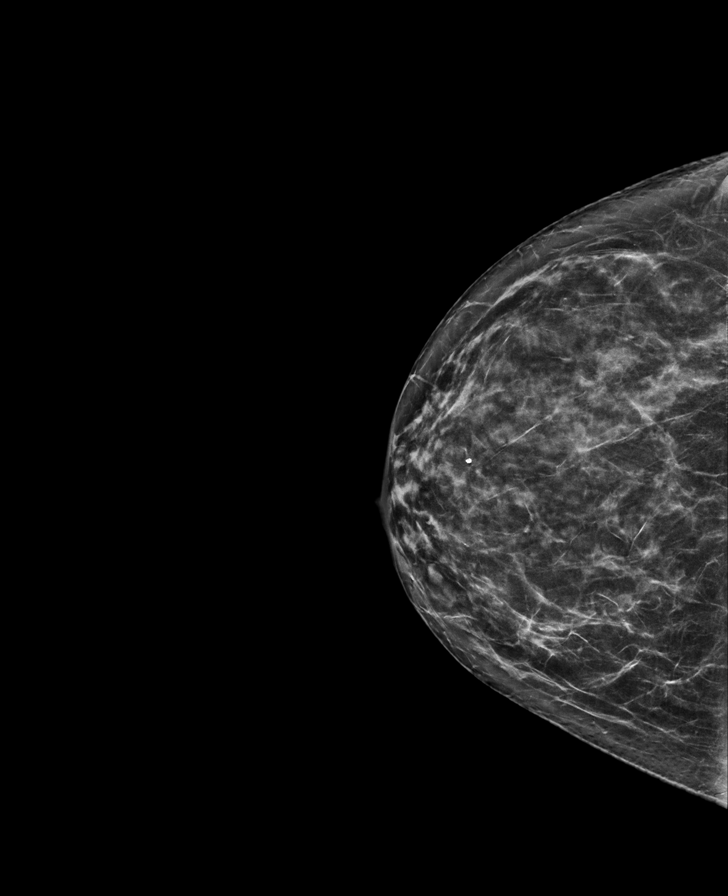

[L MLO synth-2D]
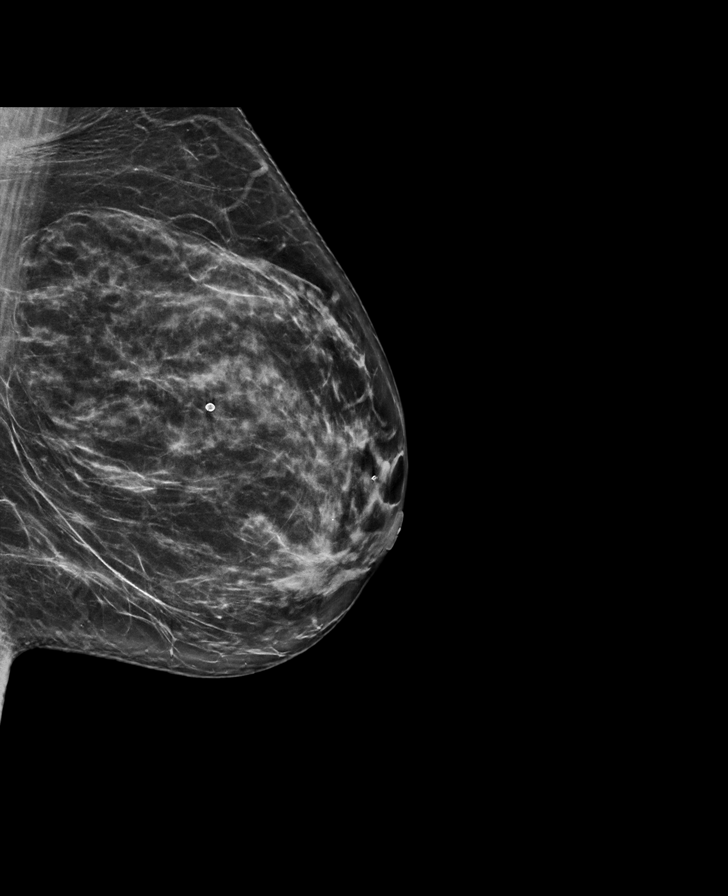

[L CC synth-2D]
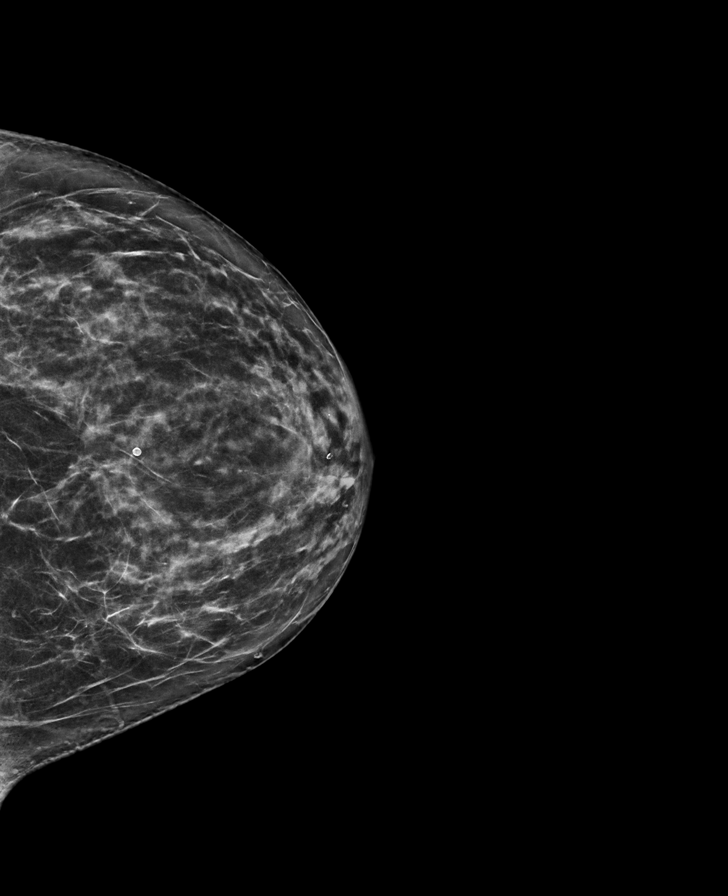

[R MLO]
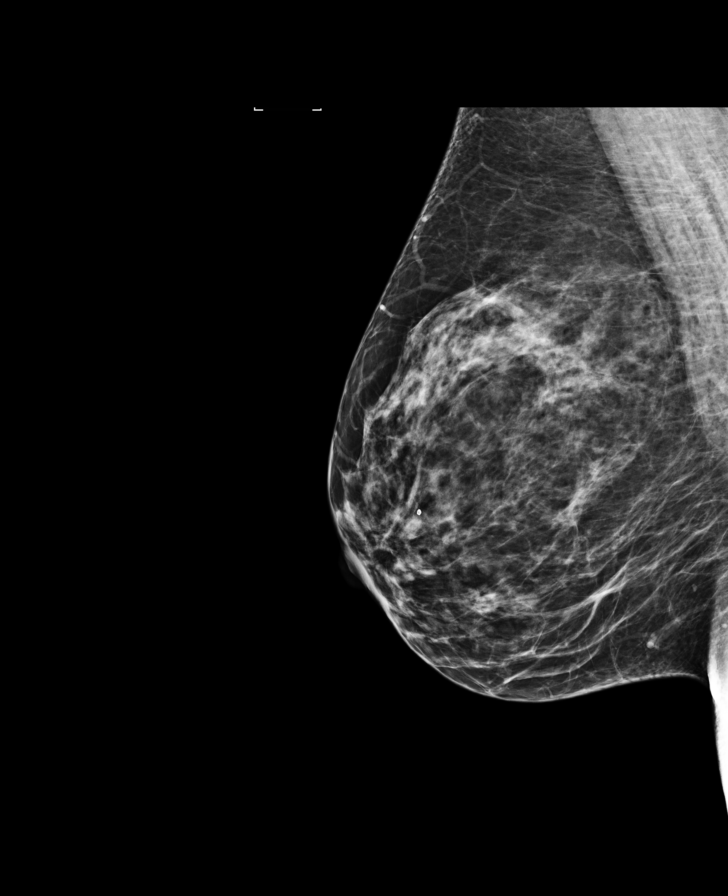

[L CC]
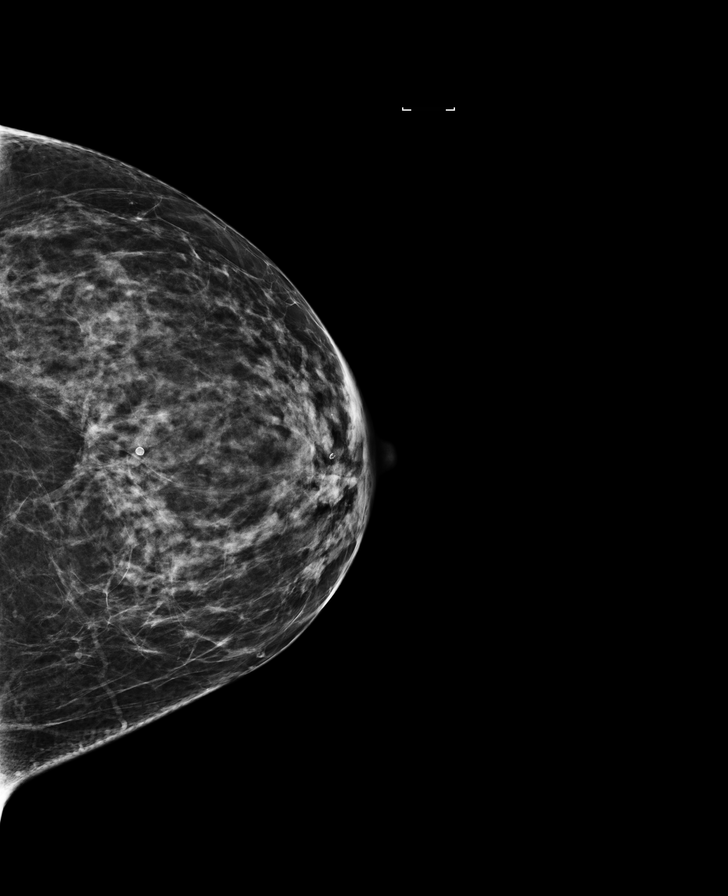

[R CC]
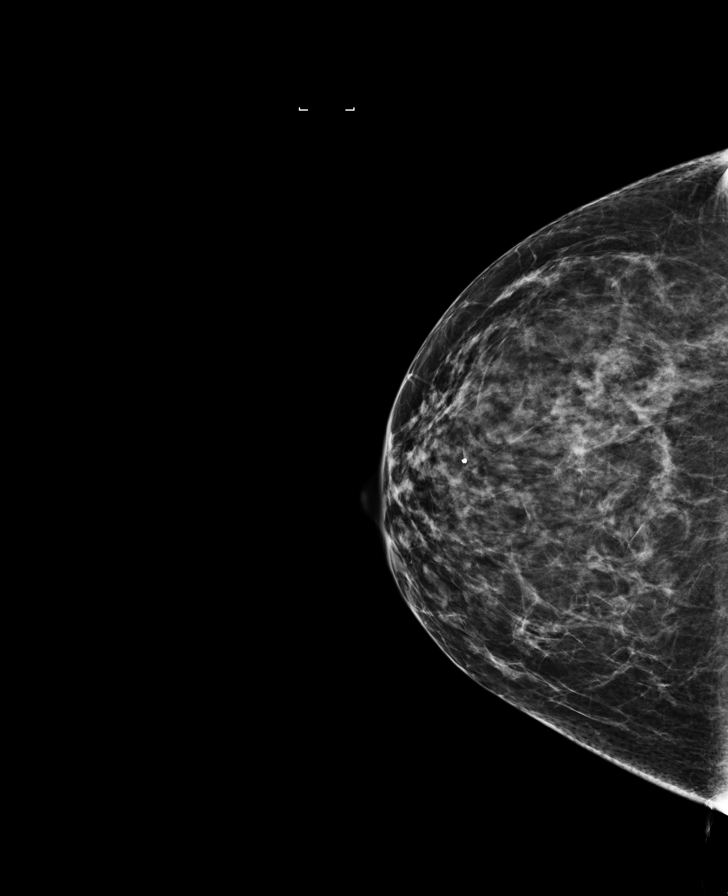

[8 of 28 positions shown; findings below may reference images not displayed]

ACR Breast Density Category c: The breast tissue is heterogeneously
dense, which may obscure small masses.
FINDINGS: There are no findings suspicious for malignancy. Images were
processed with CAD.
IMPRESSION: No mammographic evidence of malignancy. A result letter of this
screening mammogram will be mailed directly to the patient.

RECOMMENDATION:
Screening mammogram in one year. (Code:[TA])

BI-RADS CATEGORY  1: Negative.

## 2016-10-11 ENCOUNTER — Ambulatory Visit: Payer: 59

## 2016-10-19 MED FILL — FLOVENT HFA 44 MCG INHALER: 44 | 30 days supply | Qty: 11 | Fill #2

## 2016-11-22 ENCOUNTER — Other Ambulatory Visit: Payer: Self-pay | Admitting: Nurse Practitioner

## 2016-11-22 MED FILL — FLOVENT HFA 44 MCG INHALER: 44 | 30 days supply | Qty: 11 | Fill #0

## 2016-12-08 ENCOUNTER — Other Ambulatory Visit: Payer: Self-pay | Admitting: Nurse Practitioner

## 2016-12-08 MED FILL — LEVOTHYROXINE 100 MCG TABLE: 100 | 90 days supply | Qty: 90 | Fill #0

## 2016-12-24 ENCOUNTER — Ambulatory Visit (INDEPENDENT_AMBULATORY_CARE_PROVIDER_SITE_OTHER): Payer: 59

## 2016-12-24 ENCOUNTER — Encounter: Payer: Self-pay | Admitting: Nurse Practitioner

## 2016-12-24 DIAGNOSIS — R5383 Other fatigue: Secondary | ICD-10-CM

## 2016-12-24 DIAGNOSIS — Z131 Encounter for screening for diabetes mellitus: Secondary | ICD-10-CM

## 2016-12-24 DIAGNOSIS — Z79899 Other long term (current) drug therapy: Secondary | ICD-10-CM

## 2016-12-24 DIAGNOSIS — Z23 Encounter for immunization: Secondary | ICD-10-CM

## 2016-12-24 DIAGNOSIS — Z13220 Encounter for screening for lipoid disorders: Secondary | ICD-10-CM

## 2016-12-24 DIAGNOSIS — E039 Hypothyroidism, unspecified: Secondary | ICD-10-CM

## 2016-12-29 MED FILL — FLOVENT HFA 44 MCG INHALER: 44 | 30 days supply | Qty: 11 | Fill #1

## 2016-12-30 DIAGNOSIS — Z79899 Other long term (current) drug therapy: Secondary | ICD-10-CM | POA: Diagnosis not present

## 2016-12-30 DIAGNOSIS — E039 Hypothyroidism, unspecified: Secondary | ICD-10-CM | POA: Diagnosis not present

## 2016-12-30 DIAGNOSIS — Z13220 Encounter for screening for lipoid disorders: Secondary | ICD-10-CM | POA: Diagnosis not present

## 2016-12-30 DIAGNOSIS — Z131 Encounter for screening for diabetes mellitus: Secondary | ICD-10-CM | POA: Diagnosis not present

## 2016-12-30 DIAGNOSIS — R5383 Other fatigue: Secondary | ICD-10-CM | POA: Diagnosis not present

## 2016-12-31 LAB — CBC WITH DIFFERENTIAL/PLATELET
BASOS: 0 %
Basophils Absolute: 0 10*3/uL (ref 0.0–0.2)
EOS (ABSOLUTE): 0.1 10*3/uL (ref 0.0–0.4)
EOS: 3 %
HEMATOCRIT: 41.2 % (ref 34.0–46.6)
HEMOGLOBIN: 13.9 g/dL (ref 11.1–15.9)
Immature Grans (Abs): 0 10*3/uL (ref 0.0–0.1)
Immature Granulocytes: 0 %
Lymphocytes Absolute: 1.2 10*3/uL (ref 0.7–3.1)
Lymphs: 26 %
MCH: 32.6 pg (ref 26.6–33.0)
MCHC: 33.7 g/dL (ref 31.5–35.7)
MCV: 97 fL (ref 79–97)
MONOCYTES: 9 %
Monocytes Absolute: 0.4 10*3/uL (ref 0.1–0.9)
NEUTROS ABS: 2.8 10*3/uL (ref 1.4–7.0)
Neutrophils: 62 %
Platelets: 285 10*3/uL (ref 150–379)
RBC: 4.27 x10E6/uL (ref 3.77–5.28)
RDW: 12.7 % (ref 12.3–15.4)
WBC: 4.5 10*3/uL (ref 3.4–10.8)

## 2016-12-31 LAB — BASIC METABOLIC PANEL
BUN/Creatinine Ratio: 19 (ref 9–23)
BUN: 15 mg/dL (ref 6–24)
CALCIUM: 9.3 mg/dL (ref 8.7–10.2)
CO2: 20 mmol/L (ref 20–29)
Chloride: 100 mmol/L (ref 96–106)
Creatinine, Ser: 0.77 mg/dL (ref 0.57–1.00)
GFR calc non Af Amer: 87 mL/min/{1.73_m2} (ref >59)
GFR, EST AFRICAN AMERICAN: 100 mL/min/{1.73_m2} (ref >59)
Glucose: 113 mg/dL — ABNORMAL HIGH (ref 65–99)
Potassium: 4.4 mmol/L (ref 3.5–5.2)
Sodium: 137 mmol/L (ref 134–144)

## 2016-12-31 LAB — LIPID PANEL
CHOL/HDL RATIO: 3.4 ratio (ref 0.0–4.4)
Cholesterol, Total: 289 mg/dL — ABNORMAL HIGH (ref 100–199)
HDL: 85 mg/dL (ref >39)
LDL CALC: 184 mg/dL — AB (ref 0–99)
TRIGLYCERIDES: 102 mg/dL (ref 0–149)
VLDL Cholesterol Cal: 20 mg/dL (ref 5–40)

## 2016-12-31 LAB — HEPATIC FUNCTION PANEL
ALBUMIN: 4.6 g/dL (ref 3.5–5.5)
ALT: 21 IU/L (ref 0–32)
AST: 27 IU/L (ref 0–40)
Alkaline Phosphatase: 68 IU/L (ref 39–117)
Bilirubin Total: 0.4 mg/dL (ref 0.0–1.2)
Bilirubin, Direct: 0.11 mg/dL (ref 0.00–0.40)
TOTAL PROTEIN: 7.3 g/dL (ref 6.0–8.5)

## 2016-12-31 LAB — TSH: TSH: 2.81 u[IU]/mL (ref 0.450–4.500)

## 2017-01-07 ENCOUNTER — Ambulatory Visit: Payer: 59 | Admitting: Nurse Practitioner

## 2017-01-10 ENCOUNTER — Encounter: Payer: Self-pay | Admitting: Nurse Practitioner

## 2017-01-10 ENCOUNTER — Ambulatory Visit (INDEPENDENT_AMBULATORY_CARE_PROVIDER_SITE_OTHER): Payer: 59 | Admitting: Nurse Practitioner

## 2017-01-10 VITALS — BP 132/82 | Ht 66.0 in | Wt 177.4 lb

## 2017-01-10 DIAGNOSIS — R7301 Impaired fasting glucose: Secondary | ICD-10-CM | POA: Diagnosis not present

## 2017-01-10 LAB — POCT GLYCOSYLATED HEMOGLOBIN (HGB A1C): Hemoglobin A1C: 5.2

## 2017-01-11 ENCOUNTER — Encounter: Payer: Self-pay | Admitting: Nurse Practitioner

## 2017-01-11 NOTE — Progress Notes (Signed)
Subjective: Presents to discuss recent lab work.  Overall patient has a healthy diet but has been eating more fatty meat such as bacon lately.  Does drink about 2 glasses of wine per day.  Is starting back on a regular exercise program.  No chest pain/ischemic type pain or shortness of breath.  No TIA symptoms.  Does have a family history of heart disease.  Both of her parents have hyperlipidemia, her mother had a total cholesterol at one point over 300.  NAD.  Alert, oriented.  Objective:   BP 132/82   Ht 5\' 6"  (1.676 m)   Wt 177 lb 6.4 oz (80.5 kg)   LMP 01/05/2011   BMI 28.63 kg/m  Lungs clear.  Heart regular rate and rhythm. Recent Results (from the past 2160 hour(s))  Lipid panel     Status: Abnormal   Collection Time: 12/30/16  7:55 AM  Result Value Ref Range   Cholesterol, Total 289 (H) 100 - 199 mg/dL   Triglycerides 102 0 - 149 mg/dL   HDL 85 >39 mg/dL   VLDL Cholesterol Cal 20 5 - 40 mg/dL   LDL Calculated 184 (H) 0 - 99 mg/dL   Chol/HDL Ratio 3.4 0.0 - 4.4 ratio    Comment:                                   T. Chol/HDL Ratio                                             Men  Women                               1/2 Avg.Risk  3.4    3.3                                   Avg.Risk  5.0    4.4                                2X Avg.Risk  9.6    7.1                                3X Avg.Risk 23.4   11.0   Hepatic function panel     Status: None   Collection Time: 12/30/16  7:55 AM  Result Value Ref Range   Total Protein 7.3 6.0 - 8.5 g/dL   Albumin 4.6 3.5 - 5.5 g/dL   Bilirubin Total 0.4 0.0 - 1.2 mg/dL   Bilirubin, Direct 0.11 0.00 - 0.40 mg/dL   Alkaline Phosphatase 68 39 - 117 IU/L   AST 27 0 - 40 IU/L   ALT 21 0 - 32 IU/L  Basic metabolic panel     Status: Abnormal   Collection Time: 12/30/16  7:55 AM  Result Value Ref Range   Glucose 113 (H) 65 - 99 mg/dL   BUN 15 6 - 24 mg/dL   Creatinine, Ser 0.77 0.57 - 1.00 mg/dL   GFR calc non Af Amer 87 >59 mL/min/1.73  GFR  calc Af Amer 100 >59 mL/min/1.73   BUN/Creatinine Ratio 19 9 - 23   Sodium 137 134 - 144 mmol/L   Potassium 4.4 3.5 - 5.2 mmol/L   Chloride 100 96 - 106 mmol/L   CO2 20 20 - 29 mmol/L   Calcium 9.3 8.7 - 10.2 mg/dL  TSH     Status: None   Collection Time: 12/30/16  7:55 AM  Result Value Ref Range   TSH 2.810 0.450 - 4.500 uIU/mL  CBC with Differential/Platelet     Status: None   Collection Time: 12/30/16  7:55 AM  Result Value Ref Range   WBC 4.5 3.4 - 10.8 x10E3/uL   RBC 4.27 3.77 - 5.28 x10E6/uL   Hemoglobin 13.9 11.1 - 15.9 g/dL   Hematocrit 41.2 34.0 - 46.6 %   MCV 97 79 - 97 fL   MCH 32.6 26.6 - 33.0 pg   MCHC 33.7 31.5 - 35.7 g/dL   RDW 12.7 12.3 - 15.4 %   Platelets 285 150 - 379 x10E3/uL   Neutrophils 62 Not Estab. %   Lymphs 26 Not Estab. %   Monocytes 9 Not Estab. %   Eos 3 Not Estab. %   Basos 0 Not Estab. %   Neutrophils Absolute 2.8 1.4 - 7.0 x10E3/uL   Lymphocytes Absolute 1.2 0.7 - 3.1 x10E3/uL   Monocytes Absolute 0.4 0.1 - 0.9 x10E3/uL   EOS (ABSOLUTE) 0.1 0.0 - 0.4 x10E3/uL   Basophils Absolute 0.0 0.0 - 0.2 x10E3/uL   Immature Granulocytes 0 Not Estab. %   Immature Grans (Abs) 0.0 0.0 - 0.1 x10E3/uL  POCT glycosylated hemoglobin (Hb A1C)     Status: None   Collection Time: 01/10/17 11:42 AM  Result Value Ref Range   Hemoglobin A1C 5.2      Assessment:  Elevated fasting glucose - Plan: POCT glycosylated hemoglobin (Hb A1C)    Plan: Discussed options at length.  No medication at this time.  Patient elects to modify lifestyle factors for the next few months and repeat lipid profile at that time.  If no significant improvement, agrees to start a cholesterol medication.  Reviewed lifestyle factors affecting glucose and lipids.  Repeat labs in 4-6 months.  Patient to call back at that time for orders.  Recommend preventive health physical.

## 2017-01-24 MED FILL — FLOVENT HFA 44 MCG INHALER: 44 | 30 days supply | Qty: 11 | Fill #2

## 2017-01-31 ENCOUNTER — Other Ambulatory Visit: Payer: Self-pay | Admitting: Nurse Practitioner

## 2017-01-31 ENCOUNTER — Ambulatory Visit: Payer: 59 | Admitting: Nurse Practitioner

## 2017-01-31 MED FILL — VENTOLIN HFA 90 MCG INHALER: 108 (90 BAS | 16 days supply | Qty: 18 | Fill #0

## 2017-03-11 ENCOUNTER — Other Ambulatory Visit: Payer: Self-pay | Admitting: Nurse Practitioner

## 2017-03-11 MED FILL — LEVOTHYROXINE 100 MCG TABLE: 100 | 90 days supply | Qty: 90 | Fill #0

## 2017-03-14 MED FILL — FLOVENT HFA 44 MCG INHALER: 44 | 30 days supply | Qty: 11 | Fill #3

## 2017-03-29 DIAGNOSIS — H52223 Regular astigmatism, bilateral: Secondary | ICD-10-CM | POA: Diagnosis not present

## 2017-03-29 DIAGNOSIS — H524 Presbyopia: Secondary | ICD-10-CM | POA: Diagnosis not present

## 2017-03-29 DIAGNOSIS — H5203 Hypermetropia, bilateral: Secondary | ICD-10-CM | POA: Diagnosis not present

## 2017-04-13 MED FILL — FLOVENT HFA 44 MCG INHALER: 44 | 30 days supply | Qty: 11 | Fill #4

## 2017-04-14 ENCOUNTER — Other Ambulatory Visit: Payer: Self-pay | Admitting: Nurse Practitioner

## 2017-04-14 ENCOUNTER — Encounter: Payer: Self-pay | Admitting: Nurse Practitioner

## 2017-04-14 MED ORDER — VALACYCLOVIR HCL 1 G PO TABS: ORAL_TABLET | ORAL | 0 refills | Status: DC

## 2017-04-14 NOTE — Telephone Encounter (Signed)
Nurses please send in 12 refills on Valtrex

## 2017-04-15 MED FILL — valACYclovir HCL 1 GM TABS: 1 | 7 days supply | Qty: 21 | Fill #0

## 2017-05-30 MED FILL — FLOVENT HFA 44 MCG INHALER: 44 | 30 days supply | Qty: 11 | Fill #5

## 2017-05-30 MED FILL — LEVOTHYROXINE 100 MCG TABLE: 100 | 90 days supply | Qty: 90 | Fill #1

## 2017-06-15 DIAGNOSIS — B078 Other viral warts: Secondary | ICD-10-CM | POA: Diagnosis not present

## 2017-06-15 DIAGNOSIS — Z1283 Encounter for screening for malignant neoplasm of skin: Secondary | ICD-10-CM | POA: Diagnosis not present

## 2017-06-15 DIAGNOSIS — X32XXXD Exposure to sunlight, subsequent encounter: Secondary | ICD-10-CM | POA: Diagnosis not present

## 2017-06-15 DIAGNOSIS — D225 Melanocytic nevi of trunk: Secondary | ICD-10-CM | POA: Diagnosis not present

## 2017-06-15 DIAGNOSIS — L570 Actinic keratosis: Secondary | ICD-10-CM | POA: Diagnosis not present

## 2017-07-14 MED FILL — FLOVENT HFA 44 MCG INHALER: 44 | 30 days supply | Qty: 11 | Fill #6

## 2017-07-14 MED FILL — VENTOLIN HFA 90 MCG INHALER: 108 (90 BAS | 16 days supply | Qty: 18 | Fill #1

## 2017-08-22 ENCOUNTER — Other Ambulatory Visit: Payer: Self-pay | Admitting: Nurse Practitioner

## 2017-08-22 MED FILL — LEVOTHYROXINE 100 MCG TABLE: 100 | 90 days supply | Qty: 90 | Fill #0

## 2017-08-26 MED FILL — VENTOLIN HFA 90 MCG INHALER: 108 (90 BAS | 16 days supply | Qty: 18 | Fill #2

## 2017-08-26 MED FILL — FLOVENT HFA 44 MCG INHALER: 44 | 30 days supply | Qty: 11 | Fill #7

## 2017-10-11 MED FILL — FLOVENT HFA 44 MCG INHALER: 44 | 30 days supply | Qty: 11 | Fill #8

## 2017-11-08 MED FILL — FLOVENT HFA 44 MCG INHALER: 44 | 30 days supply | Qty: 11 | Fill #9

## 2017-11-25 MED FILL — LEVOTHYROXINE 100 MCG TABLE: 100 | 90 days supply | Qty: 90 | Fill #1

## 2017-12-09 ENCOUNTER — Other Ambulatory Visit: Payer: Self-pay | Admitting: Family Medicine

## 2017-12-09 DIAGNOSIS — Z1231 Encounter for screening mammogram for malignant neoplasm of breast: Secondary | ICD-10-CM

## 2017-12-12 ENCOUNTER — Ambulatory Visit
Admission: RE | Admit: 2017-12-12 | Discharge: 2017-12-12 | Disposition: A | Discharge status: Home / Self Care | Payer: 59 | Source: Ambulatory Visit | Attending: Family Medicine | Admitting: Family Medicine

## 2017-12-12 DIAGNOSIS — Z1231 Encounter for screening mammogram for malignant neoplasm of breast: Secondary | ICD-10-CM | POA: Diagnosis not present

## 2017-12-12 IMAGING — MG DIGITAL SCREENING BILATERAL MAMMOGRAM WITH TOMO AND CAD
8 series · 8 of 24 positions shown · non-contrast
Comparison: Previous exam(s).

CLINICAL DATA: Screening.

EXAM:
DIGITAL SCREENING BILATERAL MAMMOGRAM WITH TOMO AND CAD

[L MLO synth-2D]
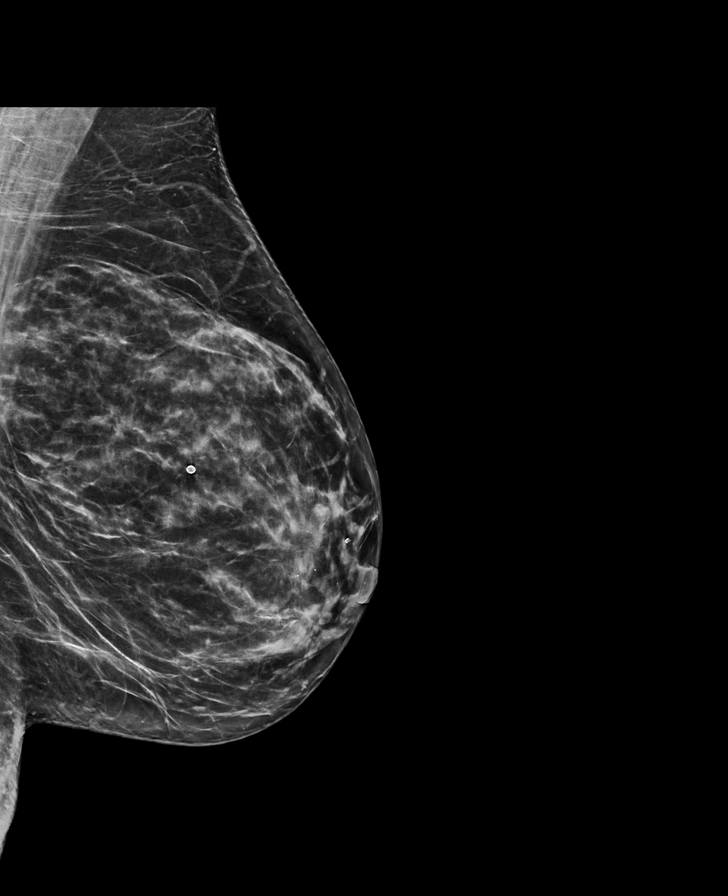

[R MLO synth-2D]
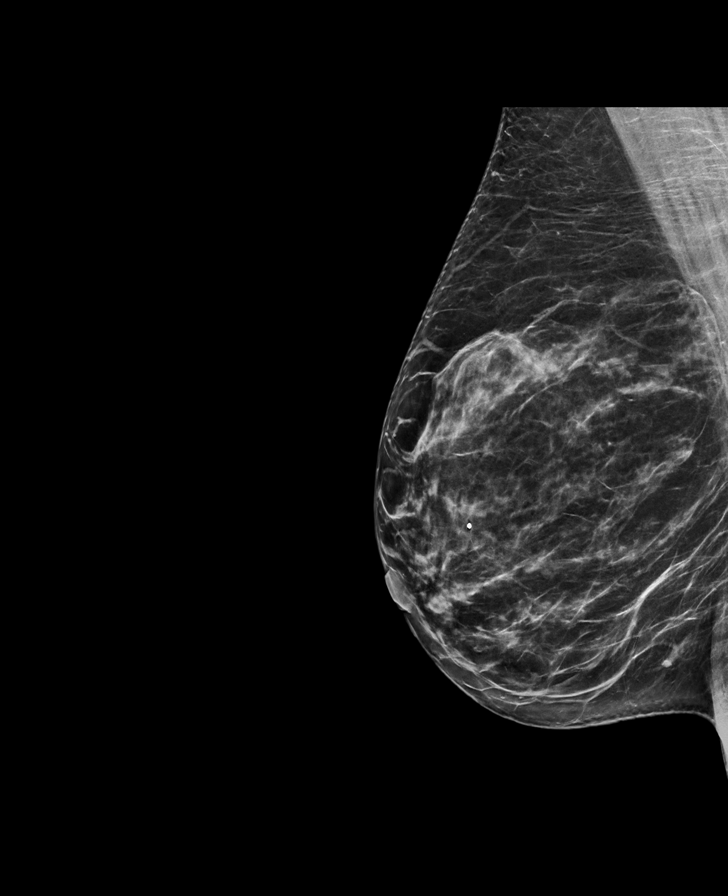

[R CC synth-2D]
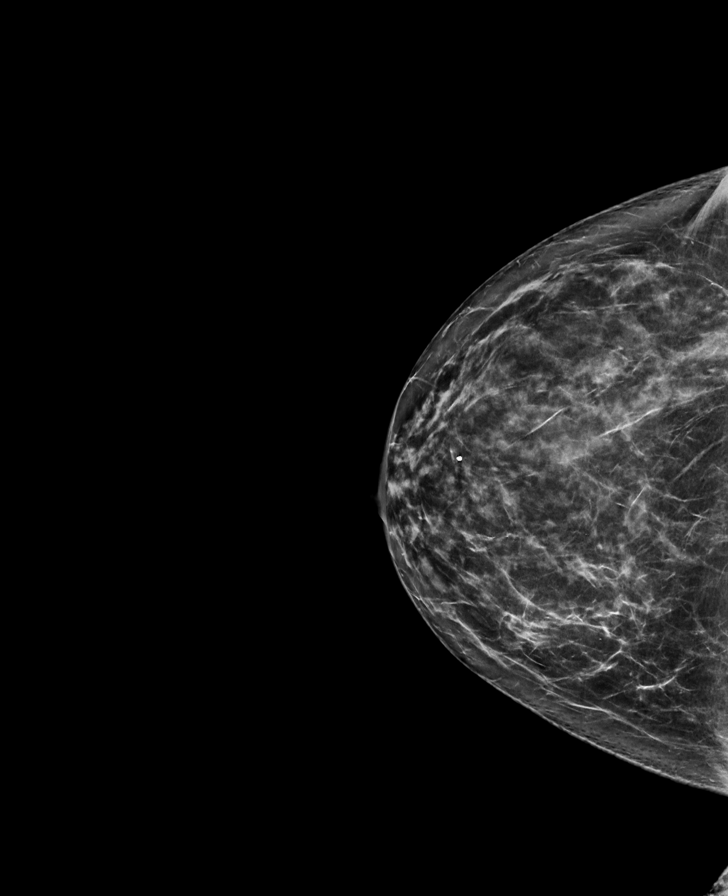

[L CC synth-2D]
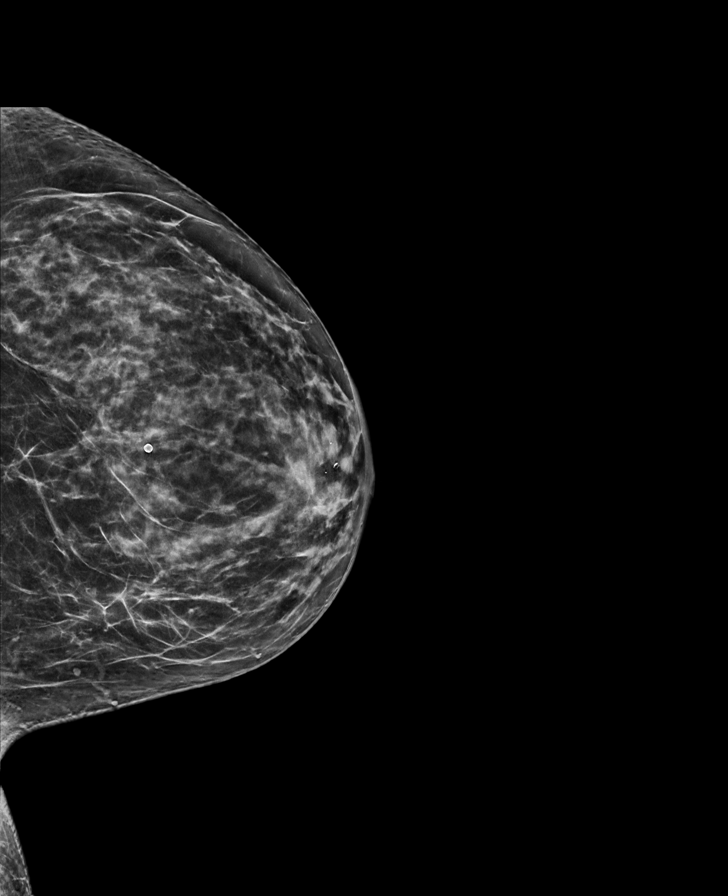

[L MLO tomo · tomo slice 37/73.0]
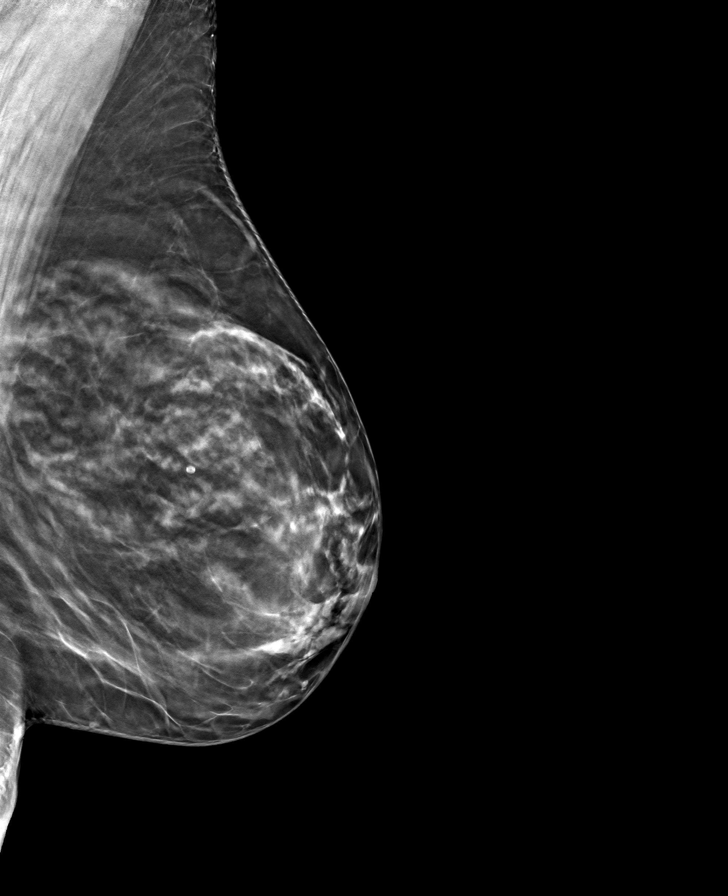

[R CC tomo · tomo slice 37/74.0]
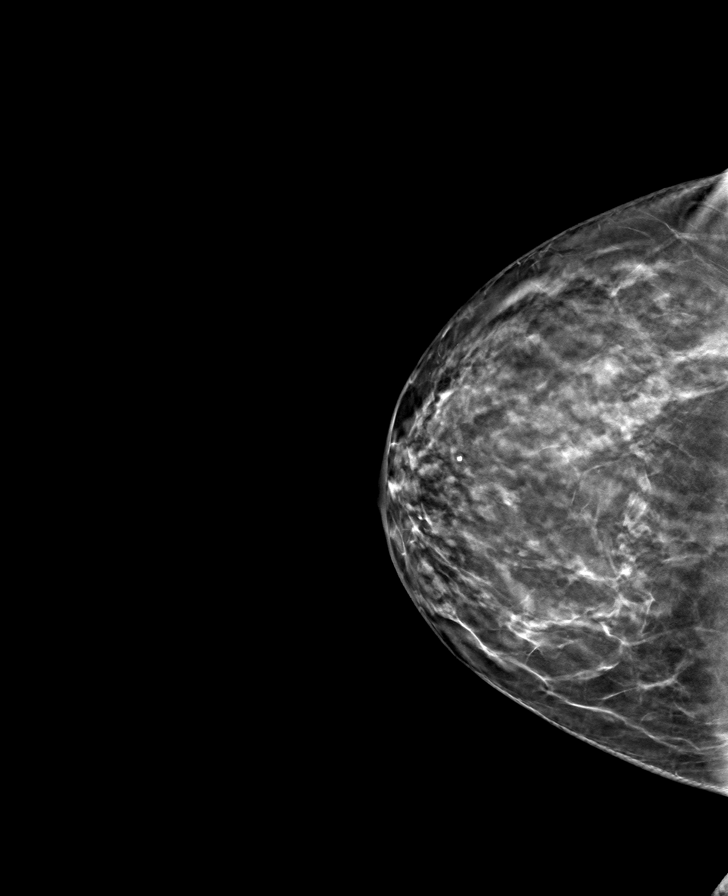

[L CC tomo · tomo slice 37/73.0]
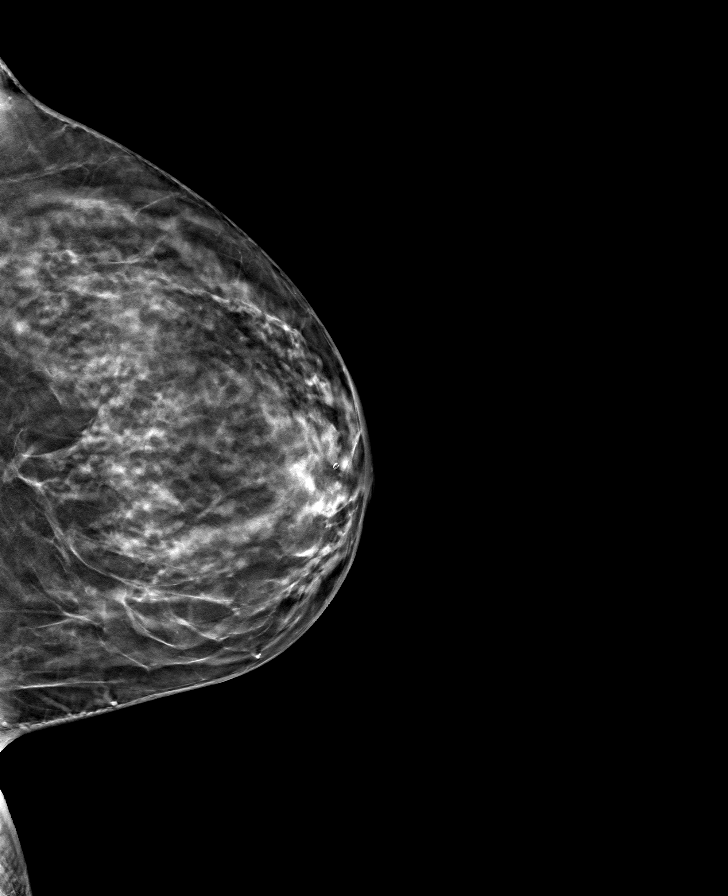

[R MLO tomo · tomo slice 39/77.0]
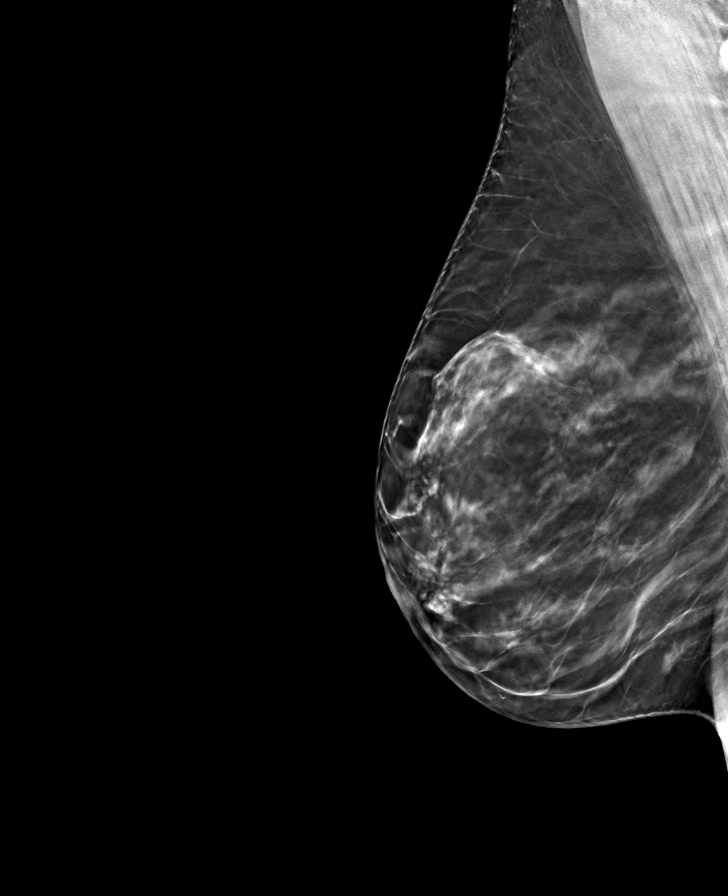

[8 of 24 positions shown; findings below may reference images not displayed]

ACR Breast Density Category c: The breast tissue is heterogeneously
dense, which may obscure small masses.
FINDINGS: There are no findings suspicious for malignancy. Images were
processed with CAD.
IMPRESSION: No mammographic evidence of malignancy. A result letter of this
screening mammogram will be mailed directly to the patient.

RECOMMENDATION:
Screening mammogram in one year. (Code:[5V])

BI-RADS CATEGORY  1: Negative.

## 2017-12-15 ENCOUNTER — Ambulatory Visit (INDEPENDENT_AMBULATORY_CARE_PROVIDER_SITE_OTHER): Payer: 59 | Admitting: *Deleted

## 2017-12-15 ENCOUNTER — Other Ambulatory Visit: Payer: Self-pay | Admitting: *Deleted

## 2017-12-15 DIAGNOSIS — Z23 Encounter for immunization: Secondary | ICD-10-CM

## 2017-12-16 ENCOUNTER — Telehealth: Payer: Self-pay | Admitting: Family Medicine

## 2017-12-16 ENCOUNTER — Other Ambulatory Visit: Payer: Self-pay | Admitting: *Deleted

## 2017-12-16 MED ORDER — FLUTICASONE PROPIONATE HFA 44 MCG/ACT IN AERO: 2.0000 | INHALATION_SPRAY | Freq: Two times a day (BID) | RESPIRATORY_TRACT | 0 refills | Status: DC

## 2017-12-16 MED ORDER — FLUTICASONE PROPIONATE HFA 44 MCG/ACT IN AERO: 2.0000 | INHALATION_SPRAY | Freq: Two times a day (BID) | RESPIRATORY_TRACT | 3 refills | Status: DC

## 2017-12-16 MED FILL — FLOVENT HFA 44 MCG INHALER: 44 | 30 days supply | Qty: 11 | Fill #0

## 2017-12-16 NOTE — Telephone Encounter (Signed)
This has been addressed and patient is aware

## 2017-12-16 NOTE — Telephone Encounter (Signed)
Pharmacy calling in regards of patient needing a refill on FLOVENT HFA 44 MCG/ACT inhaler.    [place], [place] - [id number]

## 2018-01-04 ENCOUNTER — Telehealth: Payer: Self-pay | Admitting: Family Medicine

## 2018-01-04 DIAGNOSIS — Z79899 Other long term (current) drug therapy: Secondary | ICD-10-CM

## 2018-01-04 DIAGNOSIS — R739 Hyperglycemia, unspecified: Secondary | ICD-10-CM

## 2018-01-04 DIAGNOSIS — E785 Hyperlipidemia, unspecified: Secondary | ICD-10-CM

## 2018-01-04 DIAGNOSIS — Z0000 Encounter for general adult medical examination without abnormal findings: Secondary | ICD-10-CM

## 2018-01-04 DIAGNOSIS — E039 Hypothyroidism, unspecified: Secondary | ICD-10-CM

## 2018-01-04 NOTE — Telephone Encounter (Signed)
Last labs on 12/30/16 CBC, TSH, BMP, hepatic and lipid

## 2018-01-04 NOTE — Telephone Encounter (Signed)
Pt has CPE scheduled 01/17/18. She would like to have a full panel fasting labs ordered.

## 2018-01-04 NOTE — Telephone Encounter (Signed)
Lipid, liver, metabolic 7, CBC, TSH, hemoglobin A1c-screening, physical, hyperlipidemia, fasting hyperglycemia

## 2018-01-04 NOTE — Telephone Encounter (Signed)
Orders put in. Pt notified.  

## 2018-01-10 ENCOUNTER — Other Ambulatory Visit: Payer: Self-pay

## 2018-01-10 ENCOUNTER — Encounter: Payer: 59 | Admitting: Family Medicine

## 2018-01-10 DIAGNOSIS — E039 Hypothyroidism, unspecified: Secondary | ICD-10-CM | POA: Diagnosis not present

## 2018-01-10 DIAGNOSIS — Z79899 Other long term (current) drug therapy: Secondary | ICD-10-CM | POA: Diagnosis not present

## 2018-01-10 DIAGNOSIS — E785 Hyperlipidemia, unspecified: Secondary | ICD-10-CM | POA: Diagnosis not present

## 2018-01-10 DIAGNOSIS — Z0000 Encounter for general adult medical examination without abnormal findings: Secondary | ICD-10-CM | POA: Diagnosis not present

## 2018-01-10 DIAGNOSIS — R739 Hyperglycemia, unspecified: Secondary | ICD-10-CM | POA: Diagnosis not present

## 2018-01-10 MED ORDER — DOXYCYCLINE HYCLATE 100 MG PO TABS: 100.0000 mg | ORAL_TABLET | Freq: Two times a day (BID) | ORAL | 0 refills | Status: DC

## 2018-01-11 LAB — CBC WITH DIFFERENTIAL/PLATELET
BASOS ABS: 0 10*3/uL (ref 0.0–0.2)
Basos: 1 %
EOS (ABSOLUTE): 0.1 10*3/uL (ref 0.0–0.4)
Eos: 2 %
HEMOGLOBIN: 13.7 g/dL (ref 11.1–15.9)
Hematocrit: 39.8 % (ref 34.0–46.6)
IMMATURE GRANS (ABS): 0 10*3/uL (ref 0.0–0.1)
IMMATURE GRANULOCYTES: 0 %
LYMPHS: 29 %
Lymphocytes Absolute: 1.4 10*3/uL (ref 0.7–3.1)
MCH: 32.7 pg (ref 26.6–33.0)
MCHC: 34.4 g/dL (ref 31.5–35.7)
MCV: 95 fL (ref 79–97)
MONOCYTES: 9 %
Monocytes Absolute: 0.4 10*3/uL (ref 0.1–0.9)
NEUTROS PCT: 59 %
Neutrophils Absolute: 2.8 10*3/uL (ref 1.4–7.0)
PLATELETS: 274 10*3/uL (ref 150–450)
RBC: 4.19 x10E6/uL (ref 3.77–5.28)
RDW: 12.2 % — ABNORMAL LOW (ref 12.3–15.4)
WBC: 4.7 10*3/uL (ref 3.4–10.8)

## 2018-01-11 LAB — HEPATIC FUNCTION PANEL
ALT: 18 IU/L (ref 0–32)
AST: 24 IU/L (ref 0–40)
Albumin: 4.8 g/dL (ref 3.5–5.5)
Alkaline Phosphatase: 55 IU/L (ref 39–117)
Bilirubin Total: 0.5 mg/dL (ref 0.0–1.2)
Bilirubin, Direct: 0.12 mg/dL (ref 0.00–0.40)
Total Protein: 7.2 g/dL (ref 6.0–8.5)

## 2018-01-11 LAB — LIPID PANEL
CHOL/HDL RATIO: 2.9 ratio (ref 0.0–4.4)
Cholesterol, Total: 288 mg/dL — ABNORMAL HIGH (ref 100–199)
HDL: 100 mg/dL (ref >39)
LDL CALC: 176 mg/dL — AB (ref 0–99)
TRIGLYCERIDES: 58 mg/dL (ref 0–149)
VLDL CHOLESTEROL CAL: 12 mg/dL (ref 5–40)

## 2018-01-11 LAB — BASIC METABOLIC PANEL
BUN/Creatinine Ratio: 29 — ABNORMAL HIGH (ref 9–23)
BUN: 22 mg/dL (ref 6–24)
CALCIUM: 9.3 mg/dL (ref 8.7–10.2)
CHLORIDE: 100 mmol/L (ref 96–106)
CO2: 22 mmol/L (ref 20–29)
Creatinine, Ser: 0.75 mg/dL (ref 0.57–1.00)
GFR calc Af Amer: 102 mL/min/{1.73_m2} (ref >59)
GFR calc non Af Amer: 89 mL/min/{1.73_m2} (ref >59)
GLUCOSE: 111 mg/dL — AB (ref 65–99)
Potassium: 4.8 mmol/L (ref 3.5–5.2)
Sodium: 138 mmol/L (ref 134–144)

## 2018-01-11 LAB — HEMOGLOBIN A1C
Est. average glucose Bld gHb Est-mCnc: 114 mg/dL
HEMOGLOBIN A1C: 5.6 % (ref 4.8–5.6)

## 2018-01-11 LAB — TSH: TSH: 2.48 u[IU]/mL (ref 0.450–4.500)

## 2018-01-12 MED FILL — FLOVENT HFA 44 MCG INHALER: 44 | 30 days supply | Qty: 11 | Fill #1

## 2018-01-17 ENCOUNTER — Ambulatory Visit (INDEPENDENT_AMBULATORY_CARE_PROVIDER_SITE_OTHER): Payer: 59 | Admitting: Family Medicine

## 2018-01-17 ENCOUNTER — Encounter: Payer: Self-pay | Admitting: Family Medicine

## 2018-01-17 VITALS — BP 134/96 | Ht 64.0 in | Wt 175.0 lb

## 2018-01-17 DIAGNOSIS — E039 Hypothyroidism, unspecified: Secondary | ICD-10-CM

## 2018-01-17 DIAGNOSIS — E785 Hyperlipidemia, unspecified: Secondary | ICD-10-CM

## 2018-01-17 DIAGNOSIS — R7301 Impaired fasting glucose: Secondary | ICD-10-CM | POA: Diagnosis not present

## 2018-01-17 DIAGNOSIS — Z0000 Encounter for general adult medical examination without abnormal findings: Secondary | ICD-10-CM

## 2018-01-17 DIAGNOSIS — Z124 Encounter for screening for malignant neoplasm of cervix: Secondary | ICD-10-CM

## 2018-01-17 MED ORDER — ALBUTEROL SULFATE HFA 108 (90 BASE) MCG/ACT IN AERS: INHALATION_SPRAY | RESPIRATORY_TRACT | 5 refills | Status: DC

## 2018-01-17 MED ORDER — LEVOTHYROXINE SODIUM 100 MCG PO TABS: ORAL_TABLET | ORAL | 1 refills | Status: DC

## 2018-01-17 MED FILL — VENTOLIN HFA 90 MCG INHALER: 108 (90 BAS | 17 days supply | Qty: 18 | Fill #0

## 2018-01-17 NOTE — Patient Instructions (Signed)

## 2018-01-17 NOTE — Progress Notes (Signed)
Subjective:    Patient ID: Amy Shelton, female    DOB: January 22, 1961, 57 y.o.   MRN: [id number]  HPI The patient comes in today for a wellness visit.    A review of their health history was completed.  A review of medications was also completed.  Any needed refills; none  Eating habits: health conscious, trying to cut out carbs and decrease sugars.   Falls/  MVA accidents in past few months: none  Regular exercise: walks daily, working on increasing exercise  Specialist pt sees on regular basis: none, regular vision and dental exams.   Preventative health issues were discussed.   Additional concerns: talk about preventing diabetes  No hx of abnormal pap smears. Pap due today. Sexually active, 1 partner. Declines STD testing. Denies any postmenopausal bleeding.   Review of Systems  Constitutional: Negative for chills, fatigue, fever and unexpected weight change.  HENT: Negative for congestion, ear pain, sinus pressure, sinus pain and sore throat.   Eyes: Negative for discharge and visual disturbance.  Respiratory: Negative for cough, shortness of breath and wheezing.   Cardiovascular: Negative for chest pain and leg swelling.  Gastrointestinal: Negative for abdominal pain, blood in stool, constipation, diarrhea, nausea and vomiting.  Genitourinary: Negative for difficulty urinating and hematuria.  Skin: Negative for color change.  Neurological: Negative for dizziness, weakness, light-headedness and headaches.  Hematological: Negative for adenopathy.  All other systems reviewed and are negative.      Objective:   Physical Exam  Constitutional: She is oriented to person, place, and time. She appears well-developed and well-nourished. No distress.  HENT:  Head: Normocephalic and atraumatic.  Right Ear: Tympanic membrane normal.  Left Ear: Tympanic membrane normal.  Nose: Nose normal.  Mouth/Throat: Uvula is midline and oropharynx is clear and moist.  Eyes: Pupils are  equal, round, and reactive to light. Conjunctivae and EOM are normal. Right eye exhibits no discharge. Left eye exhibits no discharge.  Neck: Neck supple. No thyromegaly present.  Cardiovascular: Normal rate, regular rhythm and normal heart sounds.  No murmur heard. Pulmonary/Chest: Effort normal and breath sounds normal. No respiratory distress. She has no wheezes. Right breast exhibits no inverted nipple, no mass, no nipple discharge, no skin change and no tenderness. Left breast exhibits no inverted nipple, no mass, no nipple discharge, no skin change and no tenderness.  Abdominal: Soft. Bowel sounds are normal. She exhibits no distension and no mass. There is no tenderness.  Genitourinary: Vagina normal and uterus normal. There is no rash, tenderness, lesion or injury on the right labia. There is no rash, tenderness, lesion or injury on the left labia. Cervix exhibits no motion tenderness, no discharge and no friability. Right adnexum displays no mass and no tenderness. Left adnexum displays no mass and no tenderness.  Genitourinary Comments: Chaperone present.   Musculoskeletal: She exhibits no edema or deformity.  Lymphadenopathy:    She has no cervical adenopathy.  Neurological: She is alert and oriented to person, place, and time. Coordination normal.  Skin: Skin is warm and dry.  Psychiatric: She has a normal mood and affect.  Nursing note and vitals reviewed.     Assessment & Plan:  1. Routine general medical examination at a health care facility Adult wellness-complete.wellness physical was conducted today. Importance of diet and exercise were discussed in detail.  In addition to this a discussion regarding safety was also covered. We also reviewed over immunizations and gave recommendations regarding current immunization needed for age.  In  addition to this additional areas were also touched on including: Preventative health exams needed:  Colonoscopy UTD Mammogram UTD,  yearly Pap Smear done today  Patient was advised yearly wellness exam. Elevated BP reading today and on repeat, instructed patient to keep track of these at home and let us know her readings over the next few weeks.  2. Hyperlipidemia, unspecified hyperlipidemia type - Plan: Lipid panel Pt will work on lifestyle factors aggressively to try and decrease LDL. Discussed if LDL becomes over 190 will need to discuss starting statin therapy. Recheck in 6 months.  3. Fasting hyperglycemia - Plan: Hemoglobin A1c Pt is borderline prediabetes with A1c at 5.6. Discussed intensive lifestyle factors to decrease weight, increase exercise, and healthy diet. We will recheck A1c in 6 months to see how these interventions are doing.  4. Hypothyroidism, unspecified type - Plan: TSH Doing well on current dose of levothryoxine. Will continue. Recheck TSH in 6 months.  5. Screening for cervical cancer - Plan: Pap IG and HPV (high risk) DNA detection  Recheck labs as noted above in 6 months, if needed will schedule f/u appt at that time.

## 2018-01-19 LAB — PAP IG AND HPV HIGH-RISK: HPV, high-risk: NEGATIVE

## 2018-01-31 ENCOUNTER — Other Ambulatory Visit: Payer: Self-pay | Admitting: *Deleted

## 2018-01-31 MED ORDER — OSELTAMIVIR PHOSPHATE 75 MG PO CAPS: 75.0000 mg | ORAL_CAPSULE | Freq: Two times a day (BID) | ORAL | 0 refills | Status: DC

## 2018-02-03 ENCOUNTER — Other Ambulatory Visit: Payer: Self-pay | Admitting: Family Medicine

## 2018-02-03 MED ORDER — AZITHROMYCIN 250 MG PO TABS: ORAL_TABLET | ORAL | 0 refills | Status: DC

## 2018-02-03 MED ORDER — ONDANSETRON 4 MG PO TBDP: ORAL_TABLET | ORAL | 0 refills | Status: DC

## 2018-02-11 MED FILL — FLOVENT HFA 44 MCG INHALER: 44 | 30 days supply | Qty: 11 | Fill #2

## 2018-02-21 MED FILL — LEVOTHYROXINE 100 MCG TABLE: 100 | 90 days supply | Qty: 90 | Fill #0

## 2018-03-13 MED FILL — FLOVENT HFA 44 MCG INHALER: 44 | 30 days supply | Qty: 11 | Fill #3

## 2018-04-04 ENCOUNTER — Other Ambulatory Visit: Payer: Self-pay | Admitting: *Deleted

## 2018-04-04 ENCOUNTER — Telehealth: Payer: Self-pay | Admitting: Family Medicine

## 2018-04-04 MED ORDER — VALACYCLOVIR HCL 1 G PO TABS: ORAL_TABLET | ORAL | 12 refills | Status: DC

## 2018-04-04 MED FILL — valACYclovir HCL 1 GM TABS: 1 | 7 days supply | Qty: 21 | Fill #0

## 2018-04-04 NOTE — Telephone Encounter (Signed)
Refills sent to pharm

## 2018-04-04 NOTE — Telephone Encounter (Signed)
Pharmacy requesting refill on Valacyclovir 1 gm tab. Take one tablet by mouth three times a day for 7 days.

## 2018-04-04 NOTE — Telephone Encounter (Signed)
May have a refill of this +12 additional refills

## 2018-04-11 DIAGNOSIS — H52223 Regular astigmatism, bilateral: Secondary | ICD-10-CM | POA: Diagnosis not present

## 2018-04-11 DIAGNOSIS — H524 Presbyopia: Secondary | ICD-10-CM | POA: Diagnosis not present

## 2018-04-11 DIAGNOSIS — H5203 Hypermetropia, bilateral: Secondary | ICD-10-CM | POA: Diagnosis not present

## 2018-04-25 MED FILL — FLOVENT HFA 44 MCG INHALER: 44 | 30 days supply | Qty: 11 | Fill #4

## 2018-05-15 ENCOUNTER — Other Ambulatory Visit: Payer: Self-pay | Admitting: *Deleted

## 2018-05-15 MED ORDER — ONDANSETRON 4 MG PO TBDP: ORAL_TABLET | ORAL | 0 refills | Status: DC

## 2018-05-15 MED FILL — VENTOLIN HFA 90 MCG INHALER: 108 (90 BAS | 17 days supply | Qty: 18 | Fill #1

## 2018-05-15 MED FILL — LEVOTHYROXINE 100 MCG TABLE: 100 | 90 days supply | Qty: 90 | Fill #1

## 2018-05-15 MED FILL — ONDANSETRON ODT 4 MG TABLET: 4 | 5 days supply | Qty: 20 | Fill #0

## 2018-05-19 MED FILL — FLOVENT HFA 44 MCG INHALER: 44 | 30 days supply | Qty: 11 | Fill #5

## 2018-05-24 MED FILL — FLOVENT HFA 44 MCG INHALER: 44 | 90 days supply | Qty: 32 | Fill #6

## 2018-05-24 MED FILL — ALBUTEROL SULFATE HFA 108 (: 108 (90 BAS | 34 days supply | Qty: 36 | Fill #2

## 2018-05-31 ENCOUNTER — Other Ambulatory Visit: Payer: Self-pay | Admitting: *Deleted

## 2018-05-31 MED ORDER — PREDNISONE 20 MG PO TABS: ORAL_TABLET | ORAL | 0 refills | Status: DC

## 2018-05-31 MED ORDER — AMOXICILLIN-POT CLAVULANATE 875-125 MG PO TABS: 1.0000 | ORAL_TABLET | Freq: Two times a day (BID) | ORAL | 0 refills | Status: AC

## 2018-05-31 MED ORDER — AZITHROMYCIN 250 MG PO TABS: ORAL_TABLET | ORAL | 0 refills | Status: DC

## 2018-06-05 ENCOUNTER — Other Ambulatory Visit: Payer: Self-pay | Admitting: *Deleted

## 2018-06-05 MED ORDER — MONTELUKAST SODIUM 10 MG PO TABS: 10.0000 mg | ORAL_TABLET | Freq: Every day | ORAL | 0 refills | Status: DC

## 2018-06-07 MED FILL — MONTELUKAST SOD 10 MG TAB: 10 | 90 days supply | Qty: 90 | Fill #0

## 2018-08-07 ENCOUNTER — Telehealth: Payer: Self-pay | Admitting: Family Medicine

## 2018-08-07 DIAGNOSIS — E559 Vitamin D deficiency, unspecified: Secondary | ICD-10-CM

## 2018-08-07 DIAGNOSIS — E785 Hyperlipidemia, unspecified: Secondary | ICD-10-CM

## 2018-08-07 DIAGNOSIS — R7301 Impaired fasting glucose: Secondary | ICD-10-CM

## 2018-08-07 DIAGNOSIS — E039 Hypothyroidism, unspecified: Secondary | ICD-10-CM

## 2018-08-07 NOTE — Telephone Encounter (Signed)
Scheduled with Hoyle Sauer for 08-18-18.  Last physical was November but she said that Ria Comment wanted her to have labwork done.  Wants thyroid labs and full fasting labs.  Needs order put in for [facility].

## 2018-08-08 ENCOUNTER — Other Ambulatory Visit: Payer: Self-pay | Admitting: Family Medicine

## 2018-08-08 NOTE — Telephone Encounter (Signed)
Lipid, liver, metabolic 7, vitamin D, K5G, TSH  Diagnosis vitamin D deficiency, hyperlipidemia, fasting hyperglycemia, hypothyroidism Please notify patient of lab work thank you

## 2018-08-08 NOTE — Telephone Encounter (Signed)
Blood work ordered in Epic. Patient notified. 

## 2018-08-14 DIAGNOSIS — E039 Hypothyroidism, unspecified: Secondary | ICD-10-CM | POA: Diagnosis not present

## 2018-08-14 DIAGNOSIS — E785 Hyperlipidemia, unspecified: Secondary | ICD-10-CM | POA: Diagnosis not present

## 2018-08-14 DIAGNOSIS — R7301 Impaired fasting glucose: Secondary | ICD-10-CM | POA: Diagnosis not present

## 2018-08-14 DIAGNOSIS — E559 Vitamin D deficiency, unspecified: Secondary | ICD-10-CM | POA: Diagnosis not present

## 2018-08-15 LAB — BASIC METABOLIC PANEL
BUN/Creatinine Ratio: 20 (ref 9–23)
BUN: 17 mg/dL (ref 6–24)
CO2: 21 mmol/L (ref 20–29)
Calcium: 9.4 mg/dL (ref 8.7–10.2)
Chloride: 98 mmol/L (ref 96–106)
Creatinine, Ser: 0.87 mg/dL (ref 0.57–1.00)
GFR calc Af Amer: 85 mL/min/{1.73_m2} (ref >59)
GFR calc non Af Amer: 74 mL/min/{1.73_m2} (ref >59)
Glucose: 117 mg/dL — ABNORMAL HIGH (ref 65–99)
Potassium: 4.2 mmol/L (ref 3.5–5.2)
Sodium: 136 mmol/L (ref 134–144)

## 2018-08-15 LAB — LIPID PANEL
Chol/HDL Ratio: 3.2 ratio (ref 0.0–4.4)
Cholesterol, Total: 284 mg/dL — ABNORMAL HIGH (ref 100–199)
HDL: 89 mg/dL (ref >39)
LDL Calculated: 171 mg/dL — ABNORMAL HIGH (ref 0–99)
Triglycerides: 118 mg/dL (ref 0–149)
VLDL Cholesterol Cal: 24 mg/dL (ref 5–40)

## 2018-08-15 LAB — HEPATIC FUNCTION PANEL
ALT: 22 IU/L (ref 0–32)
AST: 25 IU/L (ref 0–40)
Albumin: 4.6 g/dL (ref 3.8–4.9)
Alkaline Phosphatase: 70 IU/L (ref 39–117)
Bilirubin Total: 0.7 mg/dL (ref 0.0–1.2)
Bilirubin, Direct: 0.19 mg/dL (ref 0.00–0.40)
Total Protein: 6.9 g/dL (ref 6.0–8.5)

## 2018-08-15 LAB — TSH: TSH: 3.97 u[IU]/mL (ref 0.450–4.500)

## 2018-08-15 LAB — HEMOGLOBIN A1C
Est. average glucose Bld gHb Est-mCnc: 117 mg/dL
Hgb A1c MFr Bld: 5.7 % — ABNORMAL HIGH (ref 4.8–5.6)

## 2018-08-15 LAB — VITAMIN D 25 HYDROXY (VIT D DEFICIENCY, FRACTURES): Vit D, 25-Hydroxy: 20.1 ng/mL — ABNORMAL LOW (ref 30.0–100.0)

## 2018-08-18 ENCOUNTER — Other Ambulatory Visit: Payer: Self-pay

## 2018-08-18 ENCOUNTER — Ambulatory Visit (INDEPENDENT_AMBULATORY_CARE_PROVIDER_SITE_OTHER): Payer: 59 | Admitting: Nurse Practitioner

## 2018-08-18 ENCOUNTER — Telehealth: Payer: Self-pay | Admitting: *Deleted

## 2018-08-18 ENCOUNTER — Encounter: Payer: Self-pay | Admitting: Nurse Practitioner

## 2018-08-18 VITALS — BP 136/90 | Ht 63.5 in | Wt 177.6 lb

## 2018-08-18 DIAGNOSIS — R7303 Prediabetes: Secondary | ICD-10-CM | POA: Insufficient documentation

## 2018-08-18 DIAGNOSIS — E785 Hyperlipidemia, unspecified: Secondary | ICD-10-CM | POA: Diagnosis not present

## 2018-08-18 DIAGNOSIS — Z0000 Encounter for general adult medical examination without abnormal findings: Secondary | ICD-10-CM

## 2018-08-18 DIAGNOSIS — J4520 Mild intermittent asthma, uncomplicated: Secondary | ICD-10-CM

## 2018-08-18 DIAGNOSIS — E039 Hypothyroidism, unspecified: Secondary | ICD-10-CM

## 2018-08-18 DIAGNOSIS — R7301 Impaired fasting glucose: Secondary | ICD-10-CM | POA: Diagnosis not present

## 2018-08-18 DIAGNOSIS — L304 Erythema intertrigo: Secondary | ICD-10-CM | POA: Diagnosis not present

## 2018-08-18 MED ORDER — FLOVENT HFA 44 MCG/ACT IN AERO: 2.0000 | INHALATION_SPRAY | Freq: Two times a day (BID) | RESPIRATORY_TRACT | 3 refills | Status: DC

## 2018-08-18 MED ORDER — ALBUTEROL SULFATE HFA 108 (90 BASE) MCG/ACT IN AERS: INHALATION_SPRAY | RESPIRATORY_TRACT | 5 refills | Status: DC

## 2018-08-18 MED ORDER — LEVOTHYROXINE SODIUM 100 MCG PO TABS: ORAL_TABLET | ORAL | 1 refills | Status: DC

## 2018-08-18 MED ORDER — KETOCONAZOLE 2 % EX CREA: 1.0000 "application " | TOPICAL_CREAM | Freq: Two times a day (BID) | CUTANEOUS | 0 refills | Status: DC

## 2018-08-18 MED ORDER — MONTELUKAST SODIUM 10 MG PO TABS: 10.0000 mg | ORAL_TABLET | Freq: Every day | ORAL | 0 refills | Status: DC

## 2018-08-18 MED FILL — FLOVENT HFA 44 MCG INHALER: 44 | 30 days supply | Qty: 11 | Fill #0

## 2018-08-18 MED FILL — LEVOTHYROXINE 100 MCG TAB: 100 | 90 days supply | Qty: 90 | Fill #0

## 2018-08-18 MED FILL — MONTELUKAST SOD 10 MG TAB: 10 | 90 days supply | Qty: 90 | Fill #0

## 2018-08-18 MED FILL — ALBUTEROL SULFATE HFA 108 (: 108 (90 BAS | 17 days supply | Qty: 18 | Fill #0

## 2018-08-18 NOTE — Telephone Encounter (Signed)
Scripts sent to wrong pharm. Called [facility] pharm and canceled all scripts sent in today besides ketoconazole per carolyn.

## 2018-08-18 NOTE — Progress Notes (Signed)
Subjective:    Patient ID: Amy Shelton, female    DOB: May 08, 1960, 58 y.o.   MRN: [id number]  HPI The patient comes in today for a wellness visit.    A review of their health history was completed.  A review of medications was also completed.  Any needed refills; levothyroxine and inhalers   Eating habits: eats well  Falls/  MVA accidents in past few months: none  Regular exercise: yes  Specialist pt sees on regular basis: none  Preventative health issues were discussed.   Additional concerns: pt had blood work done.   Presents for routine follow-up.  Is due for her annual physical in November the patient is concerned about possible increase in [place] [facility] [place] cases.  Is high risk due to her asthma.  Gets her mammograms in November.  Trying to stay active but limited since she is homebound.  Has not done as well with her diet lately.  States she is eating too many snacks and unhealthy foods at times.  Regular vision and dental exams.  Using her albuterol inhaler before walking her dog to prevent wheezing.  Also may have to use it when she wears a mask.  Started on a full dose of Singulair 10 mg but states she had some "weird dreams".  Has now cut it to half a dose which has improved her symptoms.  Has had her lab work done and available for review today. Review of Systems  Constitutional: Negative for activity change, appetite change, fatigue and fever.  HENT: Negative for dental problem, ear pain, sinus pressure and sore throat.   Respiratory: Positive for wheezing. Negative for cough, chest tightness and shortness of breath.   Cardiovascular: Negative for chest pain.  Gastrointestinal: Negative for abdominal distention, abdominal pain, blood in stool, constipation, diarrhea, nausea and vomiting.  Genitourinary: Negative for difficulty urinating, dysuria, enuresis, frequency, genital sores, pelvic pain, urgency, vaginal bleeding and vaginal discharge.     Office Visit from 08/18/2018 in  [facility]  PHQ-9 Total Score  0          Objective:   Physical Exam Constitutional:      General: She is not in acute distress.    Appearance: She is well-developed.  Neck:     Musculoskeletal: Normal range of motion and neck supple.     Thyroid: No thyromegaly.     Trachea: No tracheal deviation.     Comments: Thyroid nontender to palpation. No mass or goiter noted.  Cardiovascular:     Rate and Rhythm: Normal rate and regular rhythm.     Heart sounds: Normal heart sounds. No murmur. No gallop.   Pulmonary:     Effort: Pulmonary effort is normal.     Breath sounds: Normal breath sounds.  Chest:     Breasts:        Right: Normal. No inverted nipple, mass, nipple discharge, skin change or tenderness.        Left: Normal. No inverted nipple, mass, nipple discharge, skin change or tenderness.  Abdominal:     General: There is no distension.     Palpations: Abdomen is soft.     Tenderness: There is no abdominal tenderness.  Genitourinary:    Vagina: Normal. No vaginal discharge.     Comments: External GU: no rashes or lesions. Skin changes consistent with hypoestrogenism. Vagina: Tissue pale, no discharge.  Bimanual exam no CMT.  Uterus and adnexa nontender, no obvious masses. Lymphadenopathy:     Cervical:  No cervical adenopathy.     Upper Body:     Right upper body: No axillary or pectoral adenopathy.     Left upper body: No axillary or pectoral adenopathy.  Skin:    General: Skin is warm and dry.     Findings: Rash present.     Comments: Some moderate sun damage noted.  Circular shiny pink rash noted under both breasts more on the left with well-defined border.  Neurological:     Mental Status: She is alert and oriented to person, place, and time.  Psychiatric:        Mood and Affect: Mood normal.        Behavior: Behavior normal.        Thought Content: Thought content normal.        Judgment: Judgment normal.    Recent Results (from the past 2160  hour(s))  Lipid panel     Status: Abnormal   Collection Time: 08/14/18  8:08 AM  Result Value Ref Range   Cholesterol, Total 284 (H) 100 - 199 mg/dL   Triglycerides 118 0 - 149 mg/dL   HDL 89 >39 mg/dL   VLDL Cholesterol Cal 24 5 - 40 mg/dL   LDL Calculated 171 (H) 0 - 99 mg/dL   Chol/HDL Ratio 3.2 0.0 - 4.4 ratio    Comment:                                   T. Chol/HDL Ratio                                             Men  Women                               1/2 Avg.Risk  3.4    3.3                                   Avg.Risk  5.0    4.4                                2X Avg.Risk  9.6    7.1                                3X Avg.Risk 23.4   11.0   Hepatic function panel     Status: None   Collection Time: 08/14/18  8:08 AM  Result Value Ref Range   Total Protein 6.9 6.0 - 8.5 g/dL   Albumin 4.6 3.8 - 4.9 g/dL   Bilirubin Total 0.7 0.0 - 1.2 mg/dL   Bilirubin, Direct 0.19 0.00 - 0.40 mg/dL   Alkaline Phosphatase 70 39 - 117 IU/L   AST 25 0 - 40 IU/L   ALT 22 0 - 32 IU/L  Basic metabolic panel     Status: Abnormal   Collection Time: 08/14/18  8:08 AM  Result Value Ref Range   Glucose 117 (H) 65 - 99 mg/dL   BUN 17 6 - 24 mg/dL   Creatinine, Ser 0.87 0.57 -  1.00 mg/dL   GFR calc non Af Amer 74 >59 mL/min/1.73   GFR calc Af Amer 85 >59 mL/min/1.73   BUN/Creatinine Ratio 20 9 - 23   Sodium 136 134 - 144 mmol/L   Potassium 4.2 3.5 - 5.2 mmol/L   Chloride 98 96 - 106 mmol/L   CO2 21 20 - 29 mmol/L   Calcium 9.4 8.7 - 10.2 mg/dL  VITAMIN D 25 Hydroxy (Vit-D Deficiency, Fractures)     Status: Abnormal   Collection Time: 08/14/18  8:08 AM  Result Value Ref Range   Vit D, 25-Hydroxy 20.1 (L) 30.0 - 100.0 ng/mL    Comment: Vitamin D deficiency has been defined by the [place] and an Endocrine Society practice guideline as a level of serum 25-OH vitamin D less than 20 ng/mL (1,2). The Endocrine Society went on to further define vitamin D insufficiency as a level  between 21 and 29 ng/mL (2). 1. IOM (Institute of Medicine). 2010. Dietary reference    intakes for calcium and D. [place]: The    [employer]. 2. Holick MF, Binkley Sabinal, Bischoff-Ferrari HA, et al.    Evaluation, treatment, and prevention of vitamin D    deficiency: an Endocrine Society clinical practice    guideline. JCEM. 2011 Jul; 96(7):1911-30.   Hemoglobin A1c     Status: Abnormal   Collection Time: 08/14/18  8:08 AM  Result Value Ref Range   Hgb A1c MFr Bld 5.7 (H) 4.8 - 5.6 %    Comment:          Prediabetes: 5.7 - 6.4          Diabetes: >6.4          Glycemic control for adults with diabetes: <7.0    Est. average glucose Bld gHb Est-mCnc 117 mg/dL  TSH     Status: None   Collection Time: 08/14/18  8:08 AM  Result Value Ref Range   TSH 3.970 0.450 - 4.500 uIU/mL        Assessment & Plan:  1. Hypothyroidism, unspecified type  2. Mild intermittent asthma without complication  3. Intertriginous dermatitis associated with moisture  4. Hyperlipidemia, unspecified hyperlipidemia type  5. Prediabetes  6. Impaired fasting glucose  Meds ordered this encounter  Medications  . ketoconazole (NIZORAL) 2 % cream    Sig: Apply 1 application topically 2 (two) times daily.    Dispense:  30 g    Refill:  0    Order Specific Question:   Supervising Provider    Answer:   Sallee Lange A [[id number]]  . DISCONTD: levothyroxine (SYNTHROID) 100 MCG tablet    Sig: TAKE 1 TABLET BY MOUTH DAILY BEFORE BREAKFAST.    Dispense:  90 tablet    Refill:  1    Order Specific Question:   Supervising Provider    Answer:   Sallee Lange A [[id number]]  . DISCONTD: montelukast (SINGULAIR) 10 MG tablet    Sig: Take 1 tablet (10 mg total) by mouth daily.    Dispense:  90 tablet    Refill:  0    Order Specific Question:   Supervising Provider    Answer:   Sallee Lange A [[id number]]  . DISCONTD: fluticasone (FLOVENT HFA) 44 MCG/ACT inhaler    Sig: Inhale 2 puffs into the lungs 2 (two)  times daily.    Dispense:  31.8 g    Refill:  3    Order Specific Question:   Supervising Provider  Answer:   Sallee Lange A [[id number]]  . DISCONTD: albuterol (VENTOLIN HFA) 108 (90 Base) MCG/ACT inhaler    Sig: INHALE 2 PUFFS INTO THE LUNGS EVERY 4 HOURS AS NEEDED FOR WHEEZING OR SHORTNESS OF BREATH.    Dispense:  18 g    Refill:  5    Order Specific Question:   Supervising Provider    Answer:   Sallee Lange A [[id number]]  . fluticasone (FLOVENT HFA) 44 MCG/ACT inhaler    Sig: Inhale 2 puffs into the lungs 2 (two) times daily.    Dispense:  31.8 g    Refill:  3    Order Specific Question:   Supervising Provider    Answer:   Sallee Lange A [[id number]]  . albuterol (VENTOLIN HFA) 108 (90 Base) MCG/ACT inhaler    Sig: INHALE 2 PUFFS INTO THE LUNGS EVERY 4 HOURS AS NEEDED FOR WHEEZING OR SHORTNESS OF BREATH.    Dispense:  18 g    Refill:  5    Order Specific Question:   Supervising Provider    Answer:   Sallee Lange A [[id number]]  . levothyroxine (SYNTHROID) 100 MCG tablet    Sig: TAKE 1 TABLET BY MOUTH DAILY BEFORE BREAKFAST.    Dispense:  90 tablet    Refill:  1    Order Specific Question:   Supervising Provider    Answer:   Sallee Lange A [[id number]]  . montelukast (SINGULAIR) 10 MG tablet    Sig: Take 1 tablet (10 mg total) by mouth daily.    Dispense:  90 tablet    Refill:  0    Order Specific Question:   Supervising Provider    Answer:   Sallee Lange A [[id number]]   Encouraged increase activity and resume healthy diet. If she is using her albuterol inhaler more than 2-3 times per week as a rescue inhaler, contact the office to discuss. Reviewed labs with patient.  Our goal is to get her A1c back below 5.6.  Also her LDL below 150.  She will make changes to her lifestyle including weight loss particularly around the abdominal area and repeat her A1c and lipids in 3 months. Keep area under breasts clean and dry.  Ketoconazole cream as directed.  Call back if rash worsens or persist. Return in  about 6 months (around 02/17/2019) for Recheck.

## 2018-08-19 ENCOUNTER — Encounter: Payer: Self-pay | Admitting: Nurse Practitioner

## 2018-09-25 MED FILL — FLOVENT HFA 44 MCG INHALER: 44 | 30 days supply | Qty: 11 | Fill #1

## 2018-09-25 MED FILL — ALBUTEROL SULFATE HFA 108 (: 108 (90 BAS | 17 days supply | Qty: 18 | Fill #1

## 2018-10-28 MED FILL — ALBUTEROL SULFATE HFA 108 (: 108 (90 BAS | 17 days supply | Qty: 18 | Fill #2

## 2018-10-28 MED FILL — FLOVENT HFA 44 MCG INHALER: 44 | 30 days supply | Qty: 11 | Fill #2

## 2018-11-15 MED FILL — LEVOTHYROXINE 100 MCG TAB: 100 | 90 days supply | Qty: 90 | Fill #1

## 2018-11-16 ENCOUNTER — Other Ambulatory Visit (INDEPENDENT_AMBULATORY_CARE_PROVIDER_SITE_OTHER): Payer: 59 | Admitting: *Deleted

## 2018-11-16 ENCOUNTER — Other Ambulatory Visit: Payer: Self-pay

## 2018-11-16 DIAGNOSIS — Z23 Encounter for immunization: Secondary | ICD-10-CM | POA: Diagnosis not present

## 2018-12-05 MED FILL — FLOVENT HFA 44 MCG INHALER: 44 | 30 days supply | Qty: 11 | Fill #3

## 2018-12-05 MED FILL — ALBUTEROL SULFATE HFA 108 (: 108 (90 BAS | 17 days supply | Qty: 18 | Fill #3

## 2019-01-04 MED FILL — ALBUTEROL SULFATE HFA 108 (: 108 (90 BAS | 17 days supply | Qty: 18 | Fill #4

## 2019-01-04 MED FILL — FLOVENT HFA 44 MCG INHALER: 44 | 30 days supply | Qty: 11 | Fill #4

## 2019-01-23 ENCOUNTER — Other Ambulatory Visit (HOSPITAL_COMMUNITY): Payer: Self-pay | Admitting: Nurse Practitioner

## 2019-01-23 DIAGNOSIS — Z1231 Encounter for screening mammogram for malignant neoplasm of breast: Secondary | ICD-10-CM

## 2019-01-30 MED FILL — ALBUTEROL SULFATE HFA 108 (: 108 (90 BAS | 17 days supply | Qty: 18 | Fill #5

## 2019-01-30 MED FILL — FLOVENT HFA 44 MCG INHALER: 44 | 30 days supply | Qty: 11 | Fill #5

## 2019-02-07 ENCOUNTER — Ambulatory Visit (HOSPITAL_COMMUNITY): Payer: 59

## 2019-02-15 ENCOUNTER — Other Ambulatory Visit: Payer: Self-pay | Admitting: Nurse Practitioner

## 2019-02-15 MED FILL — LEVOTHYROXINE 100 MCG TAB: 100 | 90 days supply | Qty: 90 | Fill #0

## 2019-03-07 MED FILL — FLOVENT HFA 44 MCG INHALER: 44 | 30 days supply | Qty: 11 | Fill #6

## 2019-04-12 MED FILL — FLOVENT HFA 44 MCG INHALER: 44 | 30 days supply | Qty: 11 | Fill #7

## 2019-04-23 ENCOUNTER — Other Ambulatory Visit: Payer: Self-pay | Admitting: Nurse Practitioner

## 2019-04-23 DIAGNOSIS — Z1231 Encounter for screening mammogram for malignant neoplasm of breast: Secondary | ICD-10-CM

## 2019-04-24 ENCOUNTER — Telehealth: Payer: Self-pay | Admitting: Family Medicine

## 2019-04-24 DIAGNOSIS — E039 Hypothyroidism, unspecified: Secondary | ICD-10-CM

## 2019-04-24 DIAGNOSIS — E559 Vitamin D deficiency, unspecified: Secondary | ICD-10-CM

## 2019-04-24 DIAGNOSIS — R7303 Prediabetes: Secondary | ICD-10-CM

## 2019-04-24 DIAGNOSIS — Z79899 Other long term (current) drug therapy: Secondary | ICD-10-CM

## 2019-04-24 DIAGNOSIS — E785 Hyperlipidemia, unspecified: Secondary | ICD-10-CM

## 2019-04-24 NOTE — Telephone Encounter (Signed)
Patient called and made an appt with Chrys Racer in May.  It will be about a month too early for her physical so she just wants to have an appt to get medication refills and have some labs done prior to end of insurance coverage.  She said that Hoyle Sauer had talked about doing some repeat labs last year but she didn't go get them done.  But she wants to have the full physical labs done and will need orders put in for [facility].   (I am not sure what insurance will cover if it is less than a year since last labs)  She said that Hoyle Sauer can send her a message through [facility] to discuss what labs need to be done prior to May appt.

## 2019-04-25 NOTE — Telephone Encounter (Signed)
I recommend repeating all of her labs from June 2020. Please see her diagnoses in her problem list which should cover the tests even though this is not a wellness exam.  Please contact patient to discuss and then order. Thanks!

## 2019-04-25 NOTE — Telephone Encounter (Signed)
Lab orders placed. Sent MyChart message to patient.

## 2019-05-07 MED FILL — FLOVENT HFA 44 MCG INHALER: 44 | 30 days supply | Qty: 11 | Fill #8

## 2019-05-17 MED FILL — LEVOTHYROXINE 100 MCG TAB: 100 | 90 days supply | Qty: 90 | Fill #1

## 2019-05-25 ENCOUNTER — Other Ambulatory Visit: Payer: Self-pay | Admitting: *Deleted

## 2019-05-25 MED ORDER — ONDANSETRON 4 MG PO TBDP: ORAL_TABLET | ORAL | 0 refills | Status: DC

## 2019-06-08 MED FILL — FLOVENT HFA 44 MCG INHALER: 44 | 30 days supply | Qty: 11 | Fill #9

## 2019-06-29 ENCOUNTER — Other Ambulatory Visit: Payer: Self-pay | Admitting: *Deleted

## 2019-06-29 MED ORDER — KETOCONAZOLE 2 % EX CREA: 1.0000 "application " | TOPICAL_CREAM | Freq: Two times a day (BID) | CUTANEOUS | 5 refills | Status: DC

## 2019-07-09 ENCOUNTER — Ambulatory Visit: Payer: 59

## 2019-07-16 ENCOUNTER — Other Ambulatory Visit (HOSPITAL_COMMUNITY): Payer: Self-pay | Admitting: Nurse Practitioner

## 2019-07-16 DIAGNOSIS — X32XXXD Exposure to sunlight, subsequent encounter: Secondary | ICD-10-CM | POA: Diagnosis not present

## 2019-07-16 DIAGNOSIS — C4441 Basal cell carcinoma of skin of scalp and neck: Secondary | ICD-10-CM | POA: Diagnosis not present

## 2019-07-16 DIAGNOSIS — Z1283 Encounter for screening for malignant neoplasm of skin: Secondary | ICD-10-CM | POA: Diagnosis not present

## 2019-07-16 DIAGNOSIS — Z1231 Encounter for screening mammogram for malignant neoplasm of breast: Secondary | ICD-10-CM

## 2019-07-16 DIAGNOSIS — C44519 Basal cell carcinoma of skin of other part of trunk: Secondary | ICD-10-CM | POA: Diagnosis not present

## 2019-07-16 DIAGNOSIS — L570 Actinic keratosis: Secondary | ICD-10-CM | POA: Diagnosis not present

## 2019-07-16 DIAGNOSIS — D225 Melanocytic nevi of trunk: Secondary | ICD-10-CM | POA: Diagnosis not present

## 2019-07-17 DIAGNOSIS — H5203 Hypermetropia, bilateral: Secondary | ICD-10-CM | POA: Diagnosis not present

## 2019-07-17 DIAGNOSIS — H524 Presbyopia: Secondary | ICD-10-CM | POA: Diagnosis not present

## 2019-07-17 DIAGNOSIS — H52223 Regular astigmatism, bilateral: Secondary | ICD-10-CM | POA: Diagnosis not present

## 2019-07-18 ENCOUNTER — Ambulatory Visit (HOSPITAL_COMMUNITY): Payer: 59

## 2019-07-23 DIAGNOSIS — Z79899 Other long term (current) drug therapy: Secondary | ICD-10-CM | POA: Diagnosis not present

## 2019-07-23 DIAGNOSIS — E559 Vitamin D deficiency, unspecified: Secondary | ICD-10-CM | POA: Diagnosis not present

## 2019-07-23 DIAGNOSIS — E039 Hypothyroidism, unspecified: Secondary | ICD-10-CM | POA: Diagnosis not present

## 2019-07-23 DIAGNOSIS — E785 Hyperlipidemia, unspecified: Secondary | ICD-10-CM | POA: Diagnosis not present

## 2019-07-23 DIAGNOSIS — R7303 Prediabetes: Secondary | ICD-10-CM | POA: Diagnosis not present

## 2019-07-24 LAB — LIPID PANEL
Chol/HDL Ratio: 3.7 ratio (ref 0.0–4.4)
Cholesterol, Total: 261 mg/dL — ABNORMAL HIGH (ref 100–199)
HDL: 71 mg/dL (ref >39)
LDL Chol Calc (NIH): 175 mg/dL — ABNORMAL HIGH (ref 0–99)
Triglycerides: 91 mg/dL (ref 0–149)
VLDL Cholesterol Cal: 15 mg/dL (ref 5–40)

## 2019-07-24 LAB — BASIC METABOLIC PANEL
BUN/Creatinine Ratio: 26 — ABNORMAL HIGH (ref 9–23)
BUN: 23 mg/dL (ref 6–24)
CO2: 19 mmol/L — ABNORMAL LOW (ref 20–29)
Calcium: 10 mg/dL (ref 8.7–10.2)
Chloride: 99 mmol/L (ref 96–106)
Creatinine, Ser: 0.88 mg/dL (ref 0.57–1.00)
GFR calc Af Amer: 83 mL/min/{1.73_m2} (ref >59)
GFR calc non Af Amer: 72 mL/min/{1.73_m2} (ref >59)
Glucose: 109 mg/dL — ABNORMAL HIGH (ref 65–99)
Potassium: 4.7 mmol/L (ref 3.5–5.2)
Sodium: 137 mmol/L (ref 134–144)

## 2019-07-24 LAB — HEMOGLOBIN A1C
Est. average glucose Bld gHb Est-mCnc: 120 mg/dL
Hgb A1c MFr Bld: 5.8 % — ABNORMAL HIGH (ref 4.8–5.6)

## 2019-07-24 LAB — HEPATIC FUNCTION PANEL
ALT: 29 IU/L (ref 0–32)
AST: 27 IU/L (ref 0–40)
Albumin: 4.9 g/dL (ref 3.8–4.9)
Alkaline Phosphatase: 75 IU/L (ref 48–121)
Bilirubin Total: 0.3 mg/dL (ref 0.0–1.2)
Bilirubin, Direct: 0.08 mg/dL (ref 0.00–0.40)
Total Protein: 7.5 g/dL (ref 6.0–8.5)

## 2019-07-24 LAB — VITAMIN D 25 HYDROXY (VIT D DEFICIENCY, FRACTURES): Vit D, 25-Hydroxy: 23.2 ng/mL — ABNORMAL LOW (ref 30.0–100.0)

## 2019-07-24 LAB — TSH: TSH: 4.03 u[IU]/mL (ref 0.450–4.500)

## 2019-07-25 ENCOUNTER — Ambulatory Visit (HOSPITAL_COMMUNITY)
Admission: RE | Admit: 2019-07-25 | Discharge: 2019-07-25 | Disposition: A | Discharge status: Home / Self Care | Payer: 59 | Source: Ambulatory Visit | Attending: Nurse Practitioner | Admitting: Nurse Practitioner

## 2019-07-25 ENCOUNTER — Other Ambulatory Visit: Payer: Self-pay

## 2019-07-25 ENCOUNTER — Encounter (HOSPITAL_COMMUNITY): Payer: Self-pay

## 2019-07-25 DIAGNOSIS — Z1231 Encounter for screening mammogram for malignant neoplasm of breast: Secondary | ICD-10-CM | POA: Insufficient documentation

## 2019-07-25 IMAGING — MG DIGITAL SCREENING BILAT W/ TOMO W/ CAD
8 series · 8 of 24 positions shown · non-contrast
Comparison: Previous exam(s).

CLINICAL DATA: Screening.

EXAM:
DIGITAL SCREENING BILATERAL MAMMOGRAM WITH TOMO AND CAD

[L CC synth-2D]
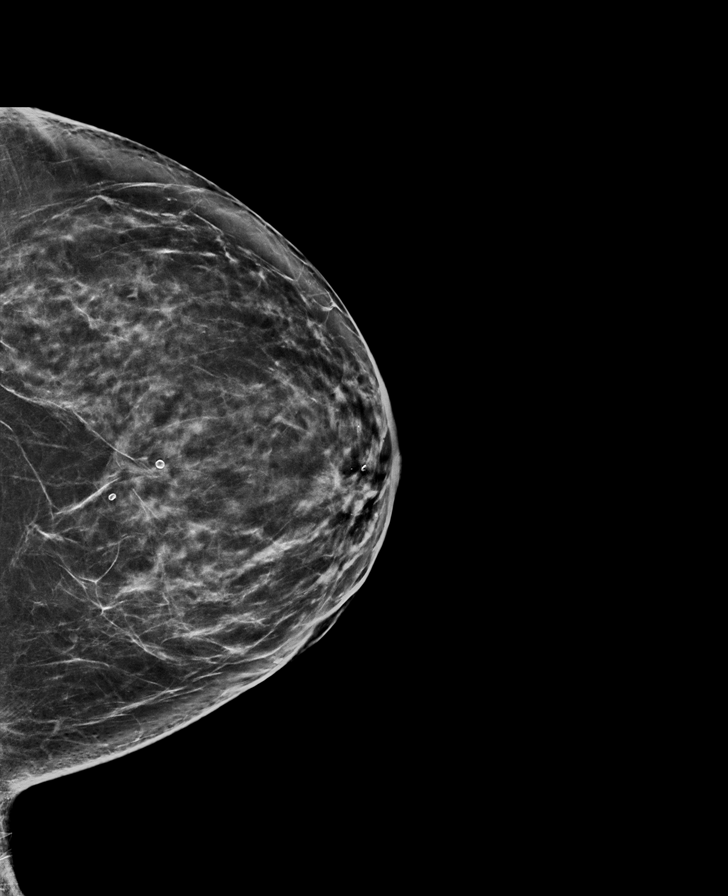

[L MLO synth-2D]
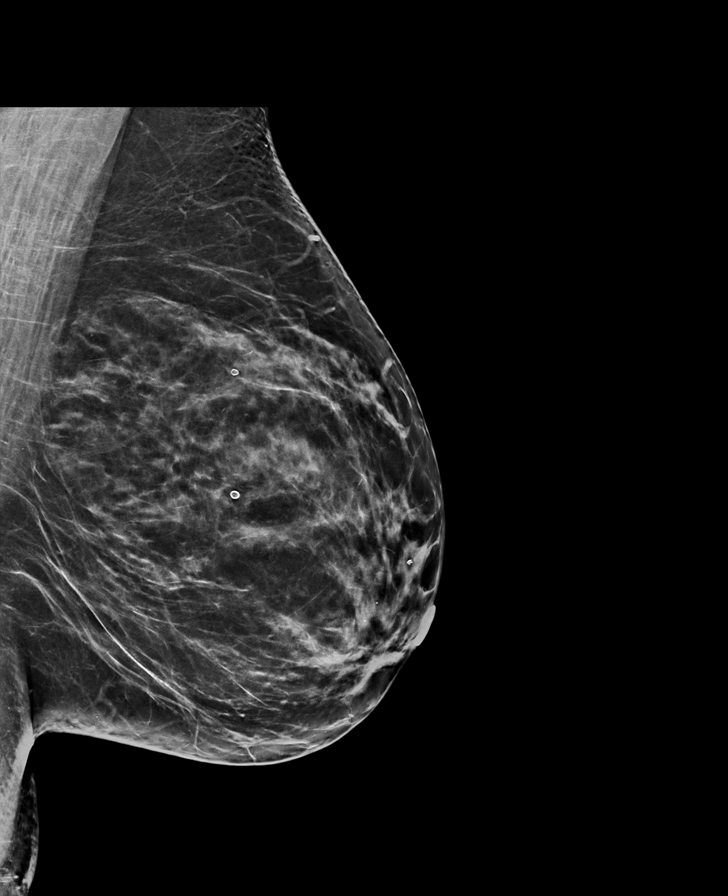

[R CC synth-2D]
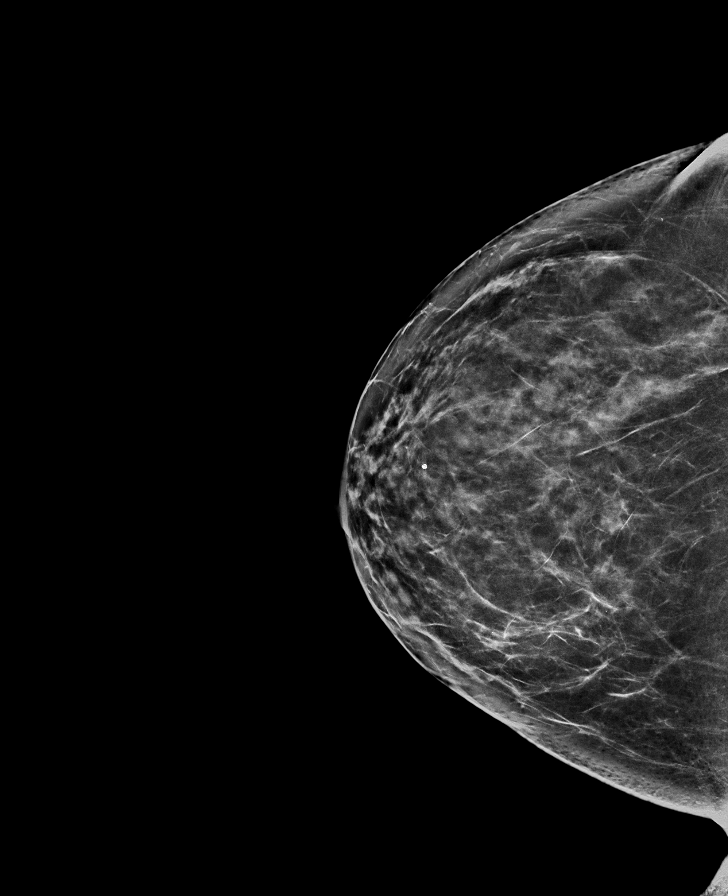

[R MLO synth-2D]
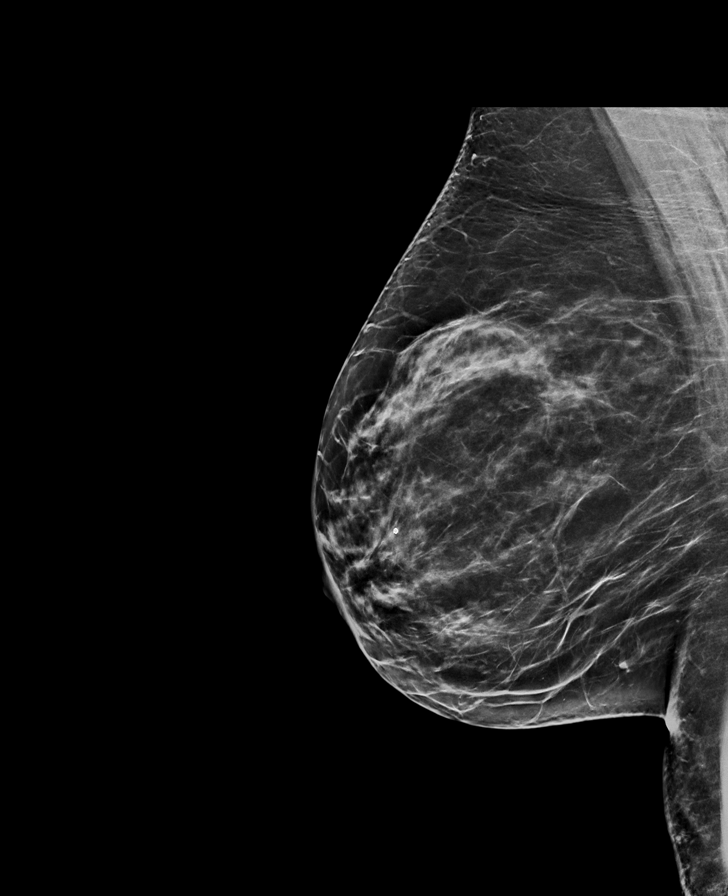

[R MLO tomo · tomo slice 40/79.0]
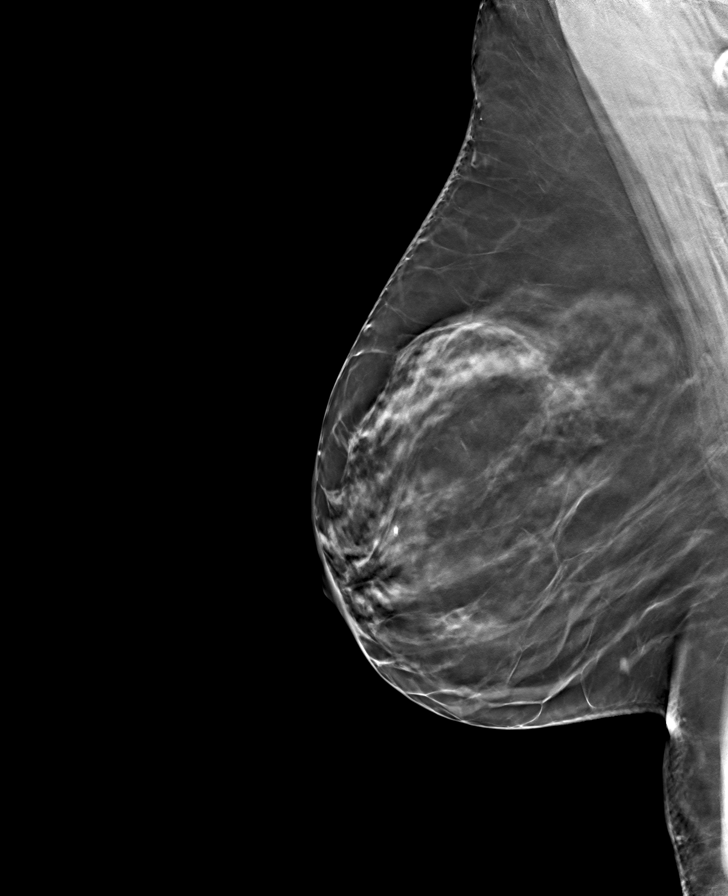

[L MLO tomo · tomo slice 39/77.0]
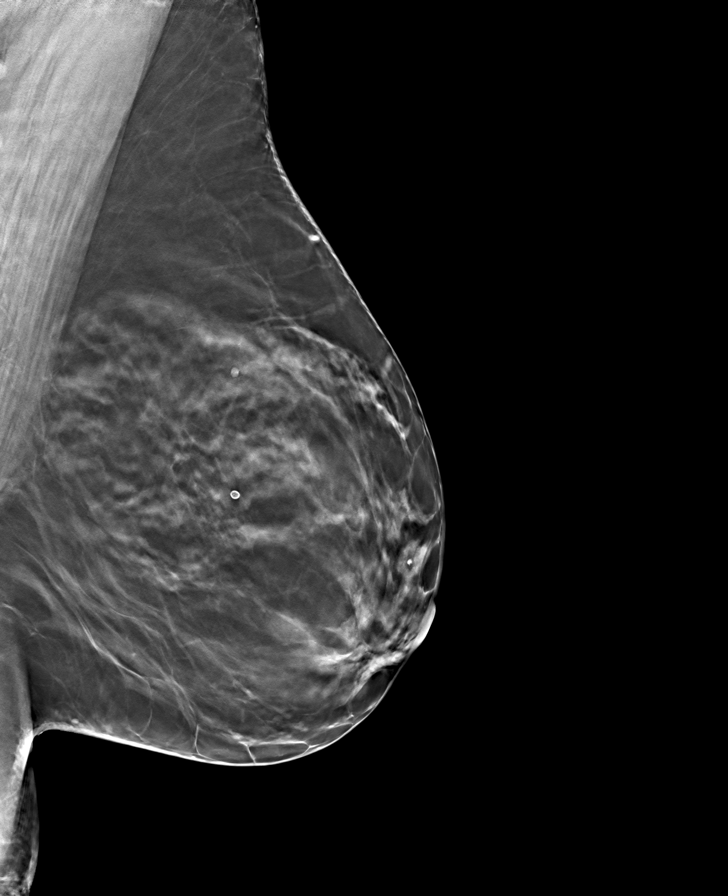

[L CC tomo · tomo slice 41/80.0]
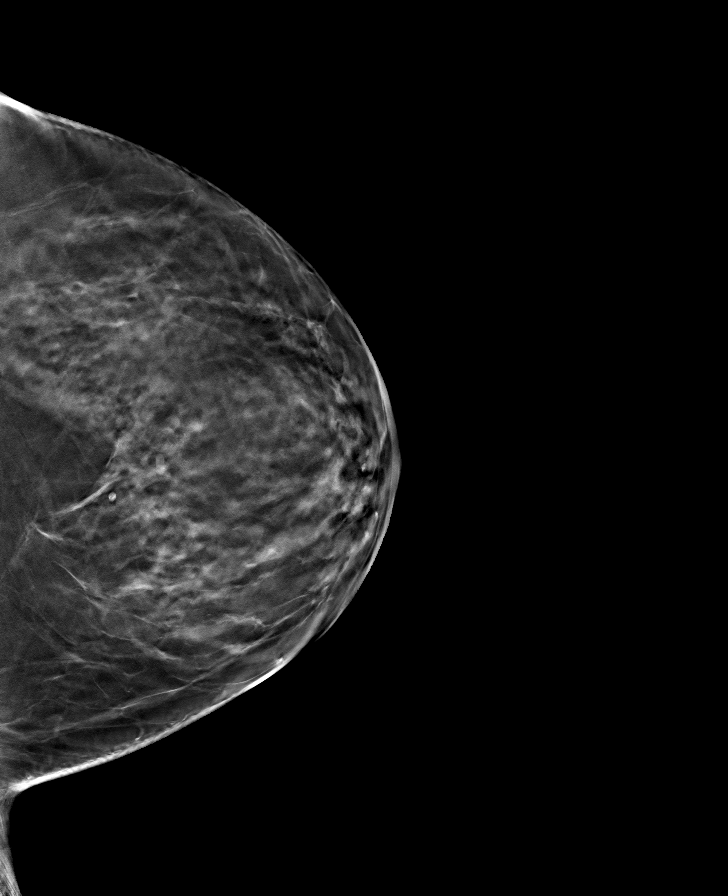

[R CC tomo · tomo slice 38/75.0]
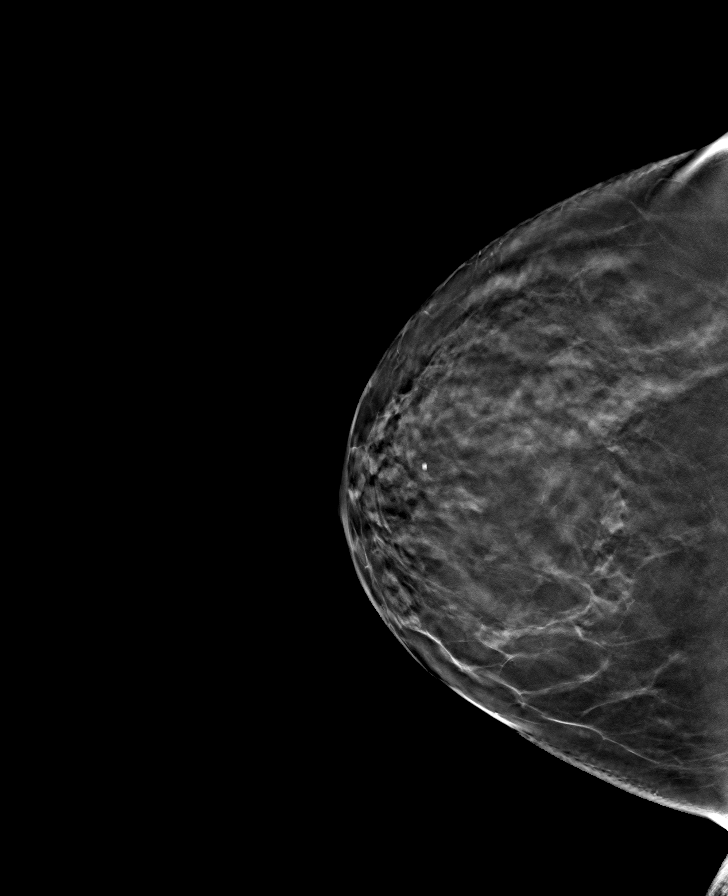

[8 of 24 positions shown; findings below may reference images not displayed]

ACR Breast Density Category b: There are scattered areas of
fibroglandular density.
FINDINGS: There are no findings suspicious for malignancy. Images were
processed with CAD.
IMPRESSION: No mammographic evidence of malignancy. A result letter of this
screening mammogram will be mailed directly to the patient.

RECOMMENDATION:
Screening mammogram in one year. (Code:[TQ])

BI-RADS CATEGORY  1: Negative.

## 2019-07-27 ENCOUNTER — Other Ambulatory Visit: Payer: Self-pay

## 2019-07-27 ENCOUNTER — Ambulatory Visit (INDEPENDENT_AMBULATORY_CARE_PROVIDER_SITE_OTHER): Payer: 59 | Admitting: Nurse Practitioner

## 2019-07-27 ENCOUNTER — Encounter: Payer: Self-pay | Admitting: Nurse Practitioner

## 2019-07-27 VITALS — BP 136/82 | Ht 64.0 in | Wt 189.2 lb

## 2019-07-27 DIAGNOSIS — E785 Hyperlipidemia, unspecified: Secondary | ICD-10-CM

## 2019-07-27 DIAGNOSIS — E038 Other specified hypothyroidism: Secondary | ICD-10-CM | POA: Diagnosis not present

## 2019-07-27 DIAGNOSIS — Z0000 Encounter for general adult medical examination without abnormal findings: Secondary | ICD-10-CM | POA: Diagnosis not present

## 2019-07-27 DIAGNOSIS — Z01419 Encounter for gynecological examination (general) (routine) without abnormal findings: Secondary | ICD-10-CM

## 2019-07-27 DIAGNOSIS — E559 Vitamin D deficiency, unspecified: Secondary | ICD-10-CM

## 2019-07-27 DIAGNOSIS — Z79899 Other long term (current) drug therapy: Secondary | ICD-10-CM | POA: Diagnosis not present

## 2019-07-27 DIAGNOSIS — E063 Autoimmune thyroiditis: Secondary | ICD-10-CM | POA: Diagnosis not present

## 2019-07-27 MED ORDER — VITAMIN D (ERGOCALCIFEROL) 1.25 MG (50000 UNIT) PO CAPS: 50000.0000 [IU] | ORAL_CAPSULE | ORAL | 0 refills | Status: DC

## 2019-07-27 MED ORDER — ROSUVASTATIN CALCIUM 5 MG PO TABS
5.0000 mg | ORAL_TABLET | Freq: Every day | ORAL | 0 refills | Status: DC
Start: 2019-07-27 — End: 2019-10-10

## 2019-07-27 MED ORDER — ALBUTEROL SULFATE HFA 108 (90 BASE) MCG/ACT IN AERS: INHALATION_SPRAY | RESPIRATORY_TRACT | 0 refills | Status: DC

## 2019-07-27 MED ORDER — FLOVENT HFA 44 MCG/ACT IN AERO: 2.0000 | INHALATION_SPRAY | Freq: Two times a day (BID) | RESPIRATORY_TRACT | 0 refills | Status: DC

## 2019-07-27 MED ORDER — LEVOTHYROXINE SODIUM 112 MCG PO TABS
112.0000 ug | ORAL_TABLET | Freq: Every day | ORAL | 0 refills | Status: DC
Start: 2019-07-27 — End: 2019-10-10

## 2019-07-27 MED ORDER — MONTELUKAST SODIUM 10 MG PO TABS: 10.0000 mg | ORAL_TABLET | Freq: Every day | ORAL | 0 refills | Status: DC

## 2019-07-27 MED ORDER — VALACYCLOVIR HCL 1 G PO TABS: ORAL_TABLET | ORAL | 0 refills | Status: DC

## 2019-07-27 MED FILL — MONTELUKAST SOD 10 MG TAB: 10 | 90 days supply | Qty: 90 | Fill #0

## 2019-07-27 MED FILL — ALBUTEROL SULFATE HFA 108 (: 108 (90 BAS | 50 days supply | Qty: 54 | Fill #0

## 2019-07-27 MED FILL — LEVOTHYROXINE 112 MCG TAB: 112 | 90 days supply | Qty: 90 | Fill #0

## 2019-07-27 MED FILL — ROSUVASTATIN CALCIUM 5 MG T: 5 | 90 days supply | Qty: 90 | Fill #0

## 2019-07-27 MED FILL — valACYclovir HCL 1 GM TABS: 1 | 30 days supply | Qty: 90 | Fill #0

## 2019-07-27 MED FILL — VIT D2 1.25 MG (50,000 UNIT: 1.25 MG | 84 days supply | Qty: 12 | Fill #0

## 2019-07-27 NOTE — Patient Instructions (Addendum)
[facility]: 10,000 units; take 2 once a week Start chewable multivitamin for women over 50 Repeat labs in 2 weeks after starting new meds

## 2019-07-27 NOTE — Progress Notes (Signed)
Subjective:    Patient ID: Amy Shelton, female    DOB: 09/17/1960, 59 y.o.   MRN: [id number]  HPI  The patient comes in today for a wellness visit.    A review of their health history was completed.  A review of medications was also completed.  Any needed refills; yes would like refills on all scripts  Eating habits: good, could be better  Falls/  MVA accidents in past few months: none  Regular exercise: walk out doors and on treadmill  Specialist pt sees on regular basis: dermatologist  Preventative health issues were discussed.   Additional concerns: discuss recent labs Depression screen [facility] 2/9 07/27/2019  Decreased Interest 0  Down, Depressed, Hopeless 0  PHQ - 2 Score 0  Some encounter information is confidential and restricted. Go to Review Flowsheets activity to see all data.   Has been experiencing some fatigue that she mainly relates to COVID restrictions and weight gain.  Sees dermatology for regular skin cancer screenings. Recently had basal cell cancers removed.  Has lost 5 lbs recently. Regular vision and dental exams. Same sexual partner.   Review of Systems  Constitutional: Positive for fatigue. Negative for activity change, appetite change and fever.  HENT: Negative for dental problem.   Respiratory: Negative for cough, chest tightness, shortness of breath and wheezing.   Cardiovascular: Negative for chest pain.  Gastrointestinal: Negative for abdominal distention, abdominal pain, blood in stool, constipation, diarrhea, nausea and vomiting.  Genitourinary: Negative for difficulty urinating, dysuria, enuresis, frequency, genital sores, menstrual problem, pelvic pain, urgency, vaginal bleeding and vaginal discharge.       Objective:   Physical Exam Constitutional:      General: She is not in acute distress.    Appearance: She is well-developed.  Neck:     Thyroid: No thyromegaly.     Trachea: No tracheal deviation.     Comments: Thyroid non  tender. No mass or goiter noted.  Cardiovascular:     Rate and Rhythm: Normal rate and regular rhythm.     Heart sounds: Normal heart sounds. No murmur. No gallop.   Pulmonary:     Effort: Pulmonary effort is normal.     Breath sounds: Normal breath sounds.  Chest:     Breasts:        Right: No swelling, inverted nipple, mass, skin change or tenderness.        Left: No swelling, inverted nipple, mass, skin change or tenderness.  Abdominal:     General: There is no distension.     Palpations: Abdomen is soft.     Tenderness: There is no abdominal tenderness.  Genitourinary:    Vagina: Normal. No vaginal discharge.     Comments: External GU: no rashes or lesions. Vagina: pink, moist, no discharge. Cervix normal in appearance. No CMT. Bimanual exam: no tenderness or obvious masses.  Musculoskeletal:     Cervical back: Normal range of motion and neck supple.  Lymphadenopathy:     Cervical: No cervical adenopathy.     Upper Body:     Right upper body: No supraclavicular, axillary or pectoral adenopathy.     Left upper body: No supraclavicular, axillary or pectoral adenopathy.  Skin:    General: Skin is warm and dry.     Comments: Solar damage noted.   Neurological:     Mental Status: She is alert and oriented to person, place, and time.  Psychiatric:        Mood and Affect:  Mood normal.        Behavior: Behavior normal.        Thought Content: Thought content normal.        Judgment: Judgment normal.    Recent Results (from the past 2160 hour(s))  TSH     Status: None   Collection Time: 07/23/19  7:56 AM  Result Value Ref Range   TSH 4.030 0.450 - 4.500 uIU/mL  Hemoglobin A1c     Status: Abnormal   Collection Time: 07/23/19  7:56 AM  Result Value Ref Range   Hgb A1c MFr Bld 5.8 (H) 4.8 - 5.6 %    Comment:          Prediabetes: 5.7 - 6.4          Diabetes: >6.4          Glycemic control for adults with diabetes: <7.0    Est. average glucose Bld gHb Est-mCnc 120 mg/dL   Vitamin D (25 hydroxy)     Status: Abnormal   Collection Time: 07/23/19  7:56 AM  Result Value Ref Range   Vit D, 25-Hydroxy 23.2 (L) 30.0 - 100.0 ng/mL    Comment: Vitamin D deficiency has been defined by the [place] and an Endocrine Society practice guideline as a level of serum 25-OH vitamin D less than 20 ng/mL (1,2). The Endocrine Society went on to further define vitamin D insufficiency as a level between 21 and 29 ng/mL (2). 1. IOM (Institute of Medicine). 2010. Dietary reference    intakes for calcium and D. [place]: The    [employer]. 2. Holick MF, Binkley [place], Bischoff-Ferrari HA, et al.    Evaluation, treatment, and prevention of vitamin D    deficiency: an Endocrine Society clinical practice    guideline. JCEM. 2011 Jul; 96(7):1911-30.   Basic Metabolic Panel (BMET)     Status: Abnormal   Collection Time: 07/23/19  7:56 AM  Result Value Ref Range   Glucose 109 (H) 65 - 99 mg/dL   BUN 23 6 - 24 mg/dL   Creatinine, Ser 0.88 0.57 - 1.00 mg/dL   GFR calc non Af Amer 72 >59 mL/min/1.73   GFR calc Af Amer 83 >59 mL/min/1.73    Comment: **[facility] currently reports eGFR in compliance with the current**   recommendations of the [employer]. [facility] will   update reporting as new guidelines are published from the NKF-ASN   Task force.    BUN/Creatinine Ratio 26 (H) 9 - 23   Sodium 137 134 - 144 mmol/L   Potassium 4.7 3.5 - 5.2 mmol/L   Chloride 99 96 - 106 mmol/L   CO2 19 (L) 20 - 29 mmol/L   Calcium 10.0 8.7 - 10.2 mg/dL  Hepatic function panel     Status: None   Collection Time: 07/23/19  7:56 AM  Result Value Ref Range   Total Protein 7.5 6.0 - 8.5 g/dL   Albumin 4.9 3.8 - 4.9 g/dL   Bilirubin Total 0.3 0.0 - 1.2 mg/dL   Bilirubin, Direct 0.08 0.00 - 0.40 mg/dL   Alkaline Phosphatase 75 48 - 121 IU/L    Comment:               **Please note reference interval change**   AST 27 0 - 40 IU/L   ALT 29 0 - 32 IU/L  Lipid  Profile     Status: Abnormal   Collection Time: 07/23/19  7:56 AM  Result Value  Ref Range   Cholesterol, Total 261 (H) 100 - 199 mg/dL   Triglycerides 91 0 - 149 mg/dL   HDL 71 >39 mg/dL   VLDL Cholesterol Cal 15 5 - 40 mg/dL   LDL Chol Calc (NIH) 175 (H) 0 - 99 mg/dL   Chol/HDL Ratio 3.7 0.0 - 4.4 ratio    Comment:                                   T. Chol/HDL Ratio                                             Men  Women                               1/2 Avg.Risk  3.4    3.3                                   Avg.Risk  5.0    4.4                                2X Avg.Risk  9.6    7.1                                3X Avg.Risk 23.4   11.0    Reviewed labs with patient.         Assessment & Plan:   Problem List Items Addressed This Visit      Endocrine   Hypothyroidism   Relevant Medications   levothyroxine (SYNTHROID) 112 MCG tablet   Other Relevant Orders   TSH     Other   Hyperlipidemia   Relevant Medications   rosuvastatin (CRESTOR) 5 MG tablet   Other Relevant Orders   Lipid panel   Vitamin D insufficiency    Other Visit Diagnoses    Well woman exam    -  Primary   High risk medication use       Relevant Orders   Hepatic function panel     Meds ordered this encounter  Medications  . albuterol (VENTOLIN HFA) 108 (90 Base) MCG/ACT inhaler    Sig: INHALE 2 PUFFS INTO THE LUNGS EVERY 4 HOURS AS NEEDED FOR WHEEZING OR SHORTNESS OF BREATH.    Dispense:  54 g    Refill:  0    Please dispense 90 day supply    Order Specific Question:   Supervising Provider    Answer:   Sallee Lange A [[id number]]  . fluticasone (FLOVENT HFA) 44 MCG/ACT inhaler    Sig: Inhale 2 puffs into the lungs 2 (two) times daily.    Dispense:  3 Inhaler    Refill:  0    Order Specific Question:   Supervising Provider    Answer:   Sallee Lange A [id number]  . montelukast (SINGULAIR) 10 MG tablet    Sig: Take 1 tablet (10 mg total) by mouth daily.    Dispense:  90 tablet    Refill:  0    Order  Specific  Question:   Supervising Provider    Answer:   Sallee Lange A [[id number]]  . valACYclovir (VALTREX) 1000 MG tablet    Sig: Take 1 tablet by mouth three times a day    Dispense:  90 tablet    Refill:  0    Order Specific Question:   Supervising Provider    Answer:   Sallee Lange A [[id number]]  . Vitamin D, Ergocalciferol, (DRISDOL) 1.25 MG (50000 UNIT) CAPS capsule    Sig: Take 1 capsule (50,000 Units total) by mouth every 7 (seven) days.    Dispense:  12 capsule    Refill:  0    Order Specific Question:   Supervising Provider    Answer:   Sallee Lange A [[id number]]  . levothyroxine (SYNTHROID) 112 MCG tablet    Sig: Take 1 tablet (112 mcg total) by mouth daily.    Dispense:  90 tablet    Refill:  0    Order Specific Question:   Supervising Provider    Answer:   Sallee Lange A [[id number]]  . rosuvastatin (CRESTOR) 5 MG tablet    Sig: Take 1 tablet (5 mg total) by mouth daily.    Dispense:  90 tablet    Refill:  0    Order Specific Question:   Supervising Provider    Answer:   Sallee Lange A [[id number]]   90 day refills on her meds. Once vitamin D rx is complete, start OTC vitamin D. Recommend chewable MVI for women over 50. Start Crestor as directed. Reviewed potential adverse effects. DC med and contact office if any problems. Increase Levothyroxine dose with a TSH goal around 1-2. Repeat labs in 8 weeks.  Continue healthy habits and weight loss efforts.

## 2019-08-01 MED FILL — FLOVENT HFA 44 MCG INHALER: 44 | 90 days supply | Qty: 32 | Fill #0

## 2019-08-02 ENCOUNTER — Ambulatory Visit: Payer: 59

## 2019-08-07 DIAGNOSIS — Z85828 Personal history of other malignant neoplasm of skin: Secondary | ICD-10-CM | POA: Diagnosis not present

## 2019-08-07 DIAGNOSIS — C4441 Basal cell carcinoma of skin of scalp and neck: Secondary | ICD-10-CM | POA: Diagnosis not present

## 2019-08-07 DIAGNOSIS — Z08 Encounter for follow-up examination after completed treatment for malignant neoplasm: Secondary | ICD-10-CM | POA: Diagnosis not present

## 2019-09-03 DIAGNOSIS — Z85828 Personal history of other malignant neoplasm of skin: Secondary | ICD-10-CM | POA: Diagnosis not present

## 2019-09-03 DIAGNOSIS — Z08 Encounter for follow-up examination after completed treatment for malignant neoplasm: Secondary | ICD-10-CM | POA: Diagnosis not present

## 2019-10-03 ENCOUNTER — Encounter: Payer: Self-pay | Admitting: Nurse Practitioner

## 2019-10-06 ENCOUNTER — Encounter: Payer: Self-pay | Admitting: Nurse Practitioner

## 2019-10-06 LAB — LIPID PANEL
Chol/HDL Ratio: 2.5 ratio (ref 0.0–4.4)
Cholesterol, Total: 186 mg/dL (ref 100–199)
HDL: 73 mg/dL (ref >39)
LDL Chol Calc (NIH): 98 mg/dL (ref 0–99)
Triglycerides: 83 mg/dL (ref 0–149)
VLDL Cholesterol Cal: 15 mg/dL (ref 5–40)

## 2019-10-06 LAB — HEPATIC FUNCTION PANEL
ALT: 24 IU/L (ref 0–32)
AST: 23 IU/L (ref 0–40)
Albumin: 4.5 g/dL (ref 3.8–4.9)
Alkaline Phosphatase: 73 IU/L (ref 48–121)
Bilirubin Total: 0.4 mg/dL (ref 0.0–1.2)
Bilirubin, Direct: 0.11 mg/dL (ref 0.00–0.40)
Total Protein: 7 g/dL (ref 6.0–8.5)

## 2019-10-06 LAB — TSH: TSH: 1.78 u[IU]/mL (ref 0.450–4.500)

## 2019-10-10 ENCOUNTER — Encounter: Payer: Self-pay | Admitting: Nurse Practitioner

## 2019-10-10 ENCOUNTER — Telehealth: Payer: Self-pay | Admitting: *Deleted

## 2019-10-10 ENCOUNTER — Other Ambulatory Visit: Payer: Self-pay | Admitting: Nurse Practitioner

## 2019-10-10 MED ORDER — ROSUVASTATIN CALCIUM 5 MG PO TABS: 5.0000 mg | ORAL_TABLET | Freq: Every day | ORAL | 1 refills | Status: DC

## 2019-10-10 MED ORDER — LEVOTHYROXINE SODIUM 112 MCG PO TABS: 112.0000 ug | ORAL_TABLET | Freq: Every day | ORAL | 3 refills | Status: DC

## 2019-10-10 NOTE — Telephone Encounter (Signed)
Pt switching pharmacies and wanted new scripts sent to [facility] [place] for crestor 5mg  and levothyroxine 112 mcg one daily. Please send my chart message after refills sent. Asked for carolyn to get message and has enough for 2 weeks and ok with carolyn doing on Friday when she is in office and would like a 90 day supply.

## 2019-10-10 NOTE — Telephone Encounter (Signed)
Done

## 2020-02-20 ENCOUNTER — Encounter: Payer: Self-pay | Admitting: Nurse Practitioner

## 2020-02-22 ENCOUNTER — Other Ambulatory Visit: Payer: Self-pay | Admitting: Nurse Practitioner

## 2020-02-22 MED ORDER — FLOVENT HFA 44 MCG/ACT IN AERO: 2.0000 | INHALATION_SPRAY | Freq: Two times a day (BID) | RESPIRATORY_TRACT | 6 refills | Status: DC

## 2020-04-02 ENCOUNTER — Other Ambulatory Visit: Payer: Self-pay | Admitting: Nurse Practitioner

## 2020-04-15 ENCOUNTER — Other Ambulatory Visit: Payer: Self-pay | Admitting: Family Medicine

## 2020-04-16 MED ORDER — ONDANSETRON 4 MG PO TBDP: ORAL_TABLET | ORAL | 0 refills | Status: DC

## 2020-06-16 ENCOUNTER — Other Ambulatory Visit: Payer: Self-pay | Admitting: Nurse Practitioner

## 2020-08-13 ENCOUNTER — Other Ambulatory Visit (HOSPITAL_COMMUNITY): Payer: Self-pay | Admitting: *Deleted

## 2020-08-13 ENCOUNTER — Other Ambulatory Visit (HOSPITAL_COMMUNITY): Payer: Self-pay | Admitting: Family Medicine

## 2020-08-13 ENCOUNTER — Telehealth: Payer: Self-pay

## 2020-08-13 DIAGNOSIS — Z1231 Encounter for screening mammogram for malignant neoplasm of breast: Secondary | ICD-10-CM

## 2020-08-13 NOTE — Telephone Encounter (Signed)
Physical scheduled for 09/19/20 and patient would like orders for labs to be done at [facility].  Would like full panel and thyroid, vit D and A1c.

## 2020-08-13 NOTE — Telephone Encounter (Signed)
Last labs completed 10/05/19 Hepatic, Lipid, TSH. Please advise. Thank you

## 2020-08-14 ENCOUNTER — Encounter: Payer: Self-pay | Admitting: Nurse Practitioner

## 2020-08-14 ENCOUNTER — Other Ambulatory Visit: Payer: Self-pay | Admitting: Nurse Practitioner

## 2020-08-14 DIAGNOSIS — E063 Autoimmune thyroiditis: Secondary | ICD-10-CM

## 2020-08-14 DIAGNOSIS — R7303 Prediabetes: Secondary | ICD-10-CM

## 2020-08-14 DIAGNOSIS — E785 Hyperlipidemia, unspecified: Secondary | ICD-10-CM

## 2020-08-14 DIAGNOSIS — R5383 Other fatigue: Secondary | ICD-10-CM

## 2020-08-14 DIAGNOSIS — E559 Vitamin D deficiency, unspecified: Secondary | ICD-10-CM

## 2020-08-14 DIAGNOSIS — R7301 Impaired fasting glucose: Secondary | ICD-10-CM

## 2020-08-14 DIAGNOSIS — E038 Other specified hypothyroidism: Secondary | ICD-10-CM

## 2020-08-14 NOTE — Telephone Encounter (Signed)
Pt contacted. Pt would like Vit D added. Pt states she has had Vitamin D Deficiency in the past. Please advise. Thank you

## 2020-08-15 NOTE — Telephone Encounter (Signed)
Pt has been notified via MyChart.

## 2020-08-25 ENCOUNTER — Other Ambulatory Visit: Payer: Self-pay

## 2020-08-25 ENCOUNTER — Ambulatory Visit (HOSPITAL_COMMUNITY)
Admission: RE | Admit: 2020-08-25 | Discharge: 2020-08-25 | Disposition: A | Discharge status: Home / Self Care | Payer: 59 | Source: Ambulatory Visit | Attending: Family Medicine | Admitting: Family Medicine

## 2020-08-25 DIAGNOSIS — Z1231 Encounter for screening mammogram for malignant neoplasm of breast: Secondary | ICD-10-CM | POA: Insufficient documentation

## 2020-08-25 IMAGING — MG MM DIGITAL SCREENING BILAT W/ TOMO AND CAD
6 of 10 series · 6 of 30 positions shown · non-contrast
Comparison: Previous exam(s).

CLINICAL DATA: Screening.

EXAM:
DIGITAL SCREENING BILATERAL MAMMOGRAM WITH TOMOSYNTHESIS AND CAD
TECHNIQUE: Bilateral screening digital craniocaudal and mediolateral oblique
mammograms were obtained. Bilateral screening digital breast
tomosynthesis was performed. The images were evaluated with
computer-aided detection.

[R MLO synth-2D]
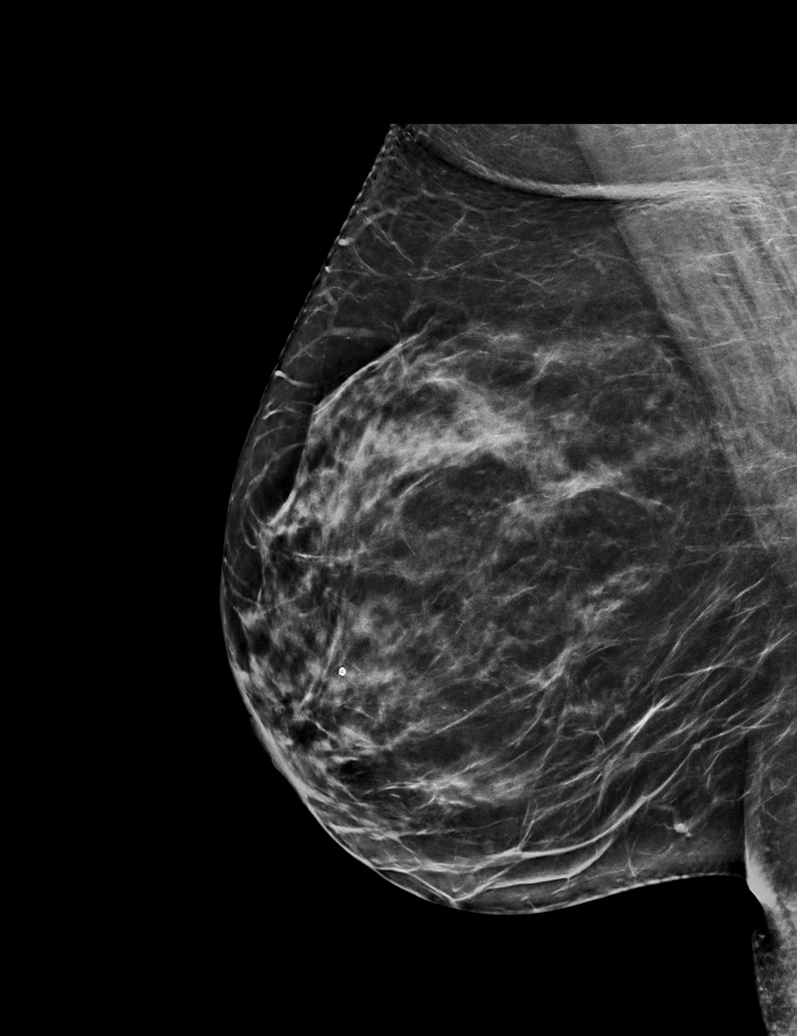

[L CC synth-2D]
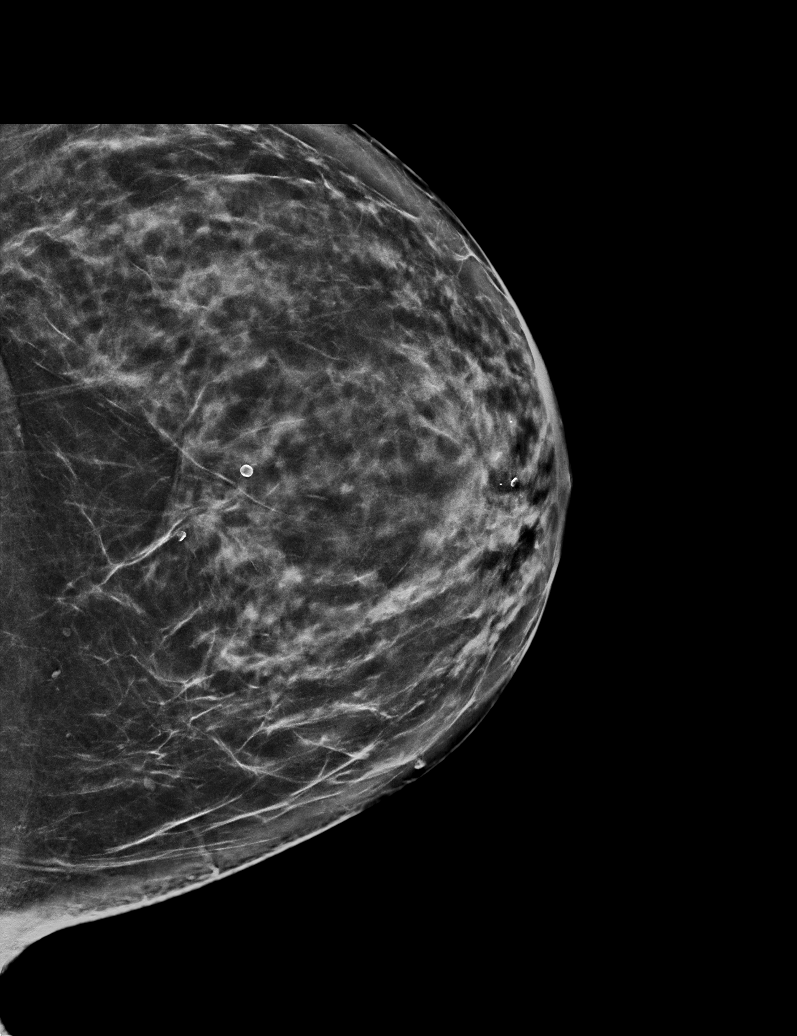

[R CC synth-2D (1 of 2)]
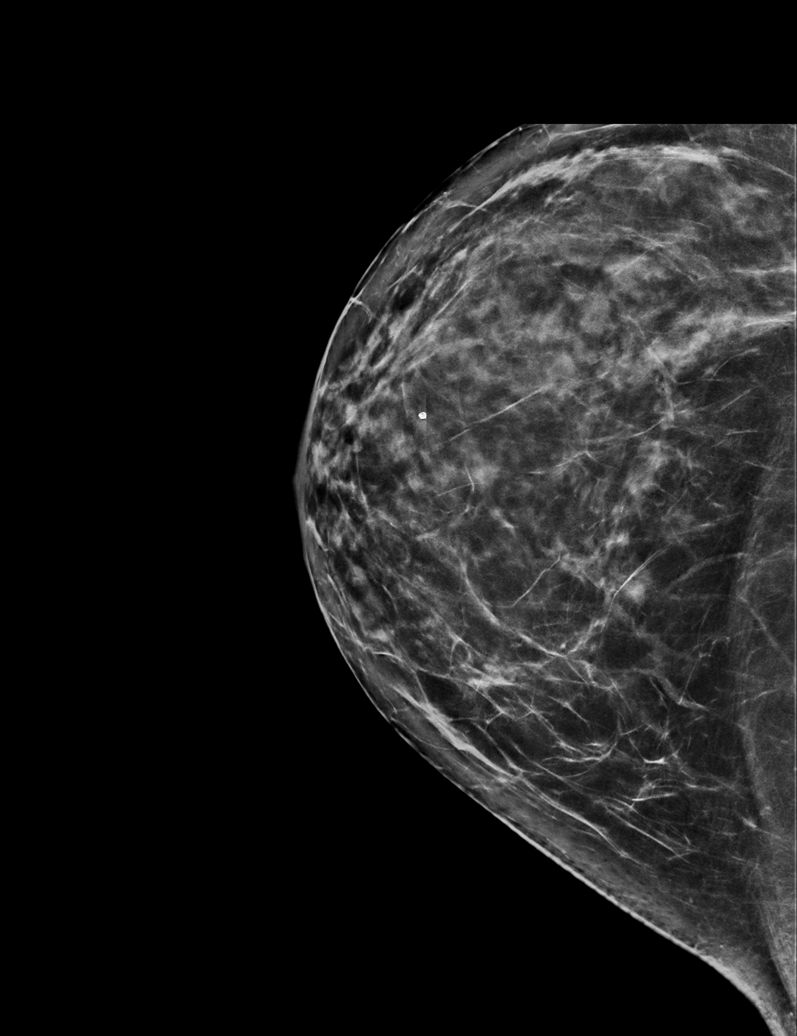

[L MLO synth-2D]
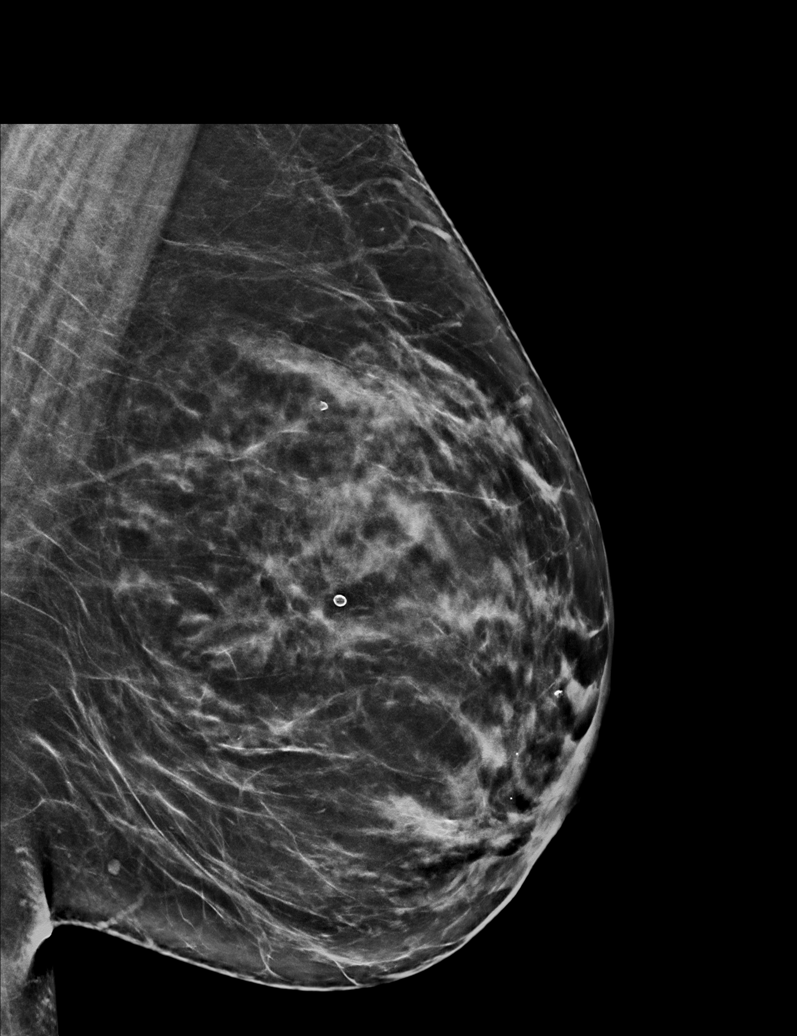

[R CC synth-2D (2 of 2)]
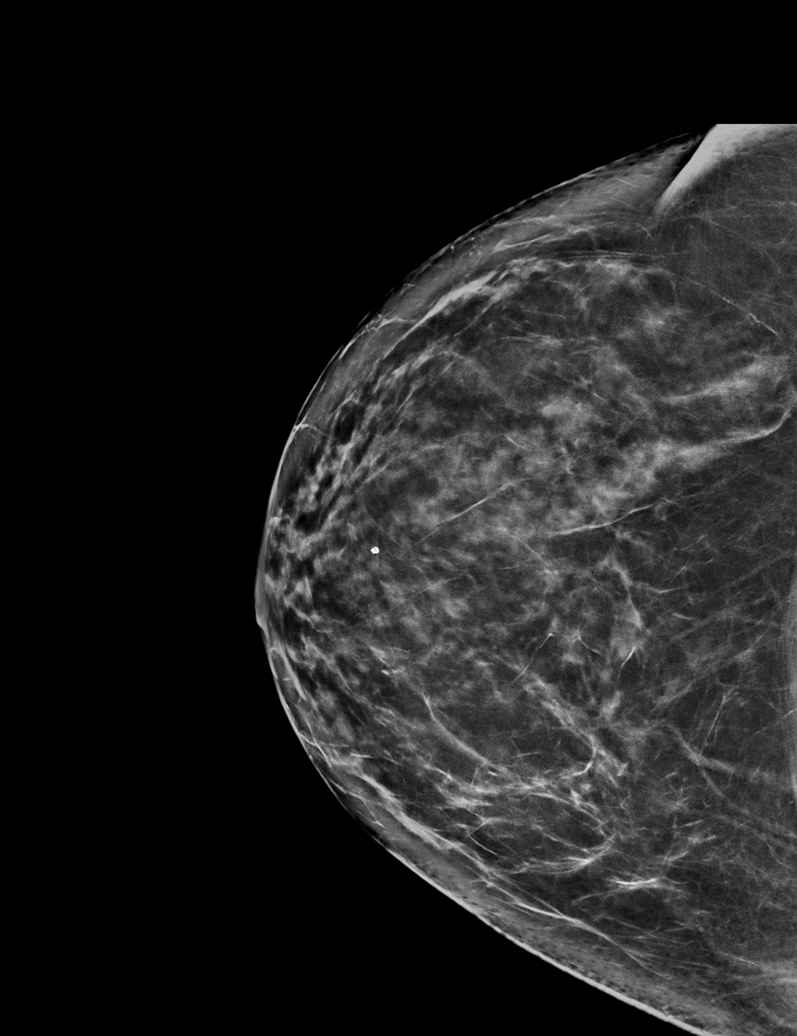

[R CC tomo · tomo slice 39/78.0]
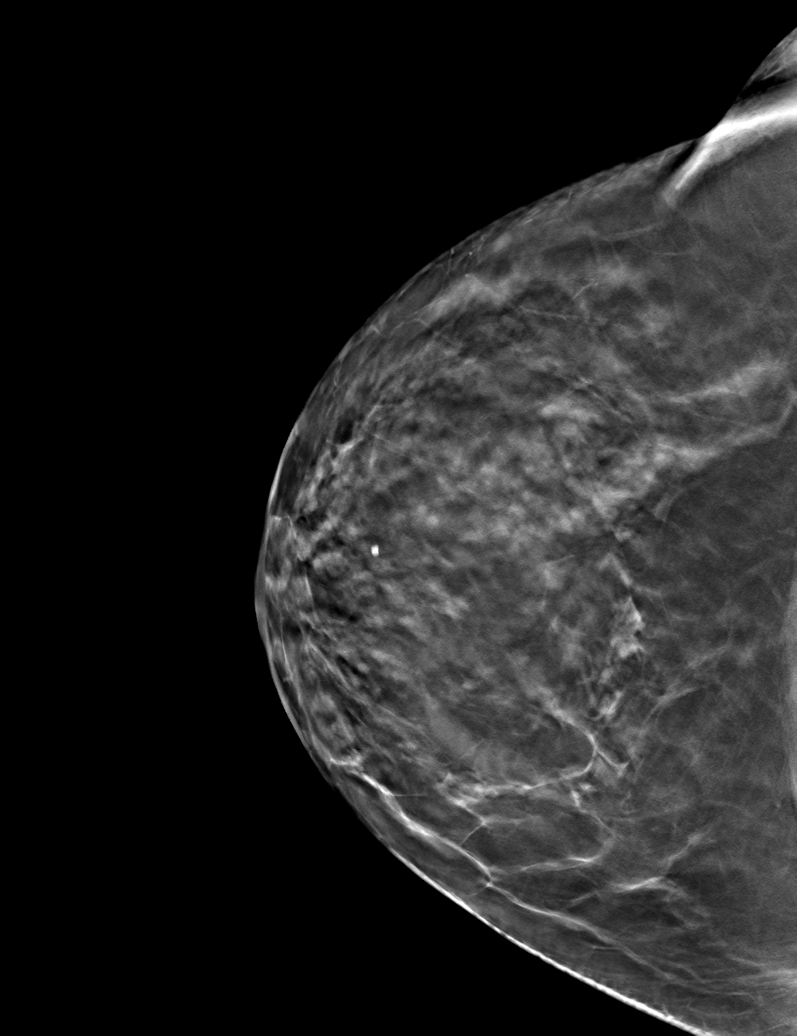

[6 of 30 positions shown; findings below may reference images not displayed]

ACR Breast Density Category c: The breast tissue is heterogeneously
dense, which may obscure small masses.
FINDINGS: There are no findings suspicious for malignancy.
IMPRESSION: No mammographic evidence of malignancy. A result letter of this
screening mammogram will be mailed directly to the patient.

RECOMMENDATION:
Screening mammogram in one year. (Code:[V2])

BI-RADS CATEGORY  1: Negative.

## 2020-09-10 LAB — LIPID PANEL
Chol/HDL Ratio: 3 ratio (ref 0.0–4.4)
Cholesterol, Total: 241 mg/dL — ABNORMAL HIGH (ref 100–199)
HDL: 81 mg/dL (ref >39)
LDL Chol Calc (NIH): 141 mg/dL — ABNORMAL HIGH (ref 0–99)
Triglycerides: 112 mg/dL (ref 0–149)
VLDL Cholesterol Cal: 19 mg/dL (ref 5–40)

## 2020-09-10 LAB — COMPREHENSIVE METABOLIC PANEL
ALT: 21 IU/L (ref 0–32)
AST: 25 IU/L (ref 0–40)
Albumin/Globulin Ratio: 2.1 (ref 1.2–2.2)
Albumin: 4.9 g/dL (ref 3.8–4.9)
Alkaline Phosphatase: 76 IU/L (ref 44–121)
BUN/Creatinine Ratio: 25 (ref 12–28)
BUN: 20 mg/dL (ref 8–27)
Bilirubin Total: 0.5 mg/dL (ref 0.0–1.2)
CO2: 24 mmol/L (ref 20–29)
Calcium: 9.4 mg/dL (ref 8.7–10.3)
Chloride: 98 mmol/L (ref 96–106)
Creatinine, Ser: 0.8 mg/dL (ref 0.57–1.00)
Globulin, Total: 2.3 g/dL (ref 1.5–4.5)
Glucose: 113 mg/dL — ABNORMAL HIGH (ref 65–99)
Potassium: 4.5 mmol/L (ref 3.5–5.2)
Sodium: 138 mmol/L (ref 134–144)
Total Protein: 7.2 g/dL (ref 6.0–8.5)
eGFR: 84 mL/min/{1.73_m2} (ref >59)

## 2020-09-10 LAB — CBC WITH DIFFERENTIAL/PLATELET
Basophils Absolute: 0.1 10*3/uL (ref 0.0–0.2)
Basos: 1 %
EOS (ABSOLUTE): 0.2 10*3/uL (ref 0.0–0.4)
Eos: 3 %
Hematocrit: 41.1 % (ref 34.0–46.6)
Hemoglobin: 13.8 g/dL (ref 11.1–15.9)
Immature Grans (Abs): 0 10*3/uL (ref 0.0–0.1)
Immature Granulocytes: 0 %
Lymphocytes Absolute: 1.8 10*3/uL (ref 0.7–3.1)
Lymphs: 29 %
MCH: 32.4 pg (ref 26.6–33.0)
MCHC: 33.6 g/dL (ref 31.5–35.7)
MCV: 97 fL (ref 79–97)
Monocytes Absolute: 0.6 10*3/uL (ref 0.1–0.9)
Monocytes: 10 %
Neutrophils Absolute: 3.5 10*3/uL (ref 1.4–7.0)
Neutrophils: 57 %
Platelets: 263 10*3/uL (ref 150–450)
RBC: 4.26 x10E6/uL (ref 3.77–5.28)
RDW: 11.9 % (ref 11.7–15.4)
WBC: 6.1 10*3/uL (ref 3.4–10.8)

## 2020-09-10 LAB — TSH: TSH: 3.39 u[IU]/mL (ref 0.450–4.500)

## 2020-09-10 LAB — HEMOGLOBIN A1C
Est. average glucose Bld gHb Est-mCnc: 123 mg/dL
Hgb A1c MFr Bld: 5.9 % — ABNORMAL HIGH (ref 4.8–5.6)

## 2020-09-10 LAB — VITAMIN D 25 HYDROXY (VIT D DEFICIENCY, FRACTURES): Vit D, 25-Hydroxy: 27 ng/mL — ABNORMAL LOW (ref 30.0–100.0)

## 2020-09-19 ENCOUNTER — Ambulatory Visit (INDEPENDENT_AMBULATORY_CARE_PROVIDER_SITE_OTHER): Payer: 59 | Admitting: Nurse Practitioner

## 2020-09-19 ENCOUNTER — Other Ambulatory Visit: Payer: Self-pay

## 2020-09-19 VITALS — BP 136/86 | Temp 97.3°F | Ht 64.0 in | Wt 181.8 lb

## 2020-09-19 DIAGNOSIS — E038 Other specified hypothyroidism: Secondary | ICD-10-CM

## 2020-09-19 DIAGNOSIS — R7301 Impaired fasting glucose: Secondary | ICD-10-CM

## 2020-09-19 DIAGNOSIS — E063 Autoimmune thyroiditis: Secondary | ICD-10-CM

## 2020-09-19 DIAGNOSIS — Z01411 Encounter for gynecological examination (general) (routine) with abnormal findings: Secondary | ICD-10-CM

## 2020-09-19 DIAGNOSIS — E785 Hyperlipidemia, unspecified: Secondary | ICD-10-CM

## 2020-09-19 DIAGNOSIS — E039 Hypothyroidism, unspecified: Secondary | ICD-10-CM

## 2020-09-19 DIAGNOSIS — E041 Nontoxic single thyroid nodule: Secondary | ICD-10-CM

## 2020-09-19 DIAGNOSIS — Z79899 Other long term (current) drug therapy: Secondary | ICD-10-CM

## 2020-09-19 DIAGNOSIS — Z01419 Encounter for gynecological examination (general) (routine) without abnormal findings: Secondary | ICD-10-CM

## 2020-09-19 NOTE — Progress Notes (Signed)
Subjective:    Patient ID: Amy Shelton, female    DOB: March 25, 1960, 60 y.o.   MRN: [id number]  HPI  The patient comes in today for a wellness visit.    A review of their health history was completed.  A review of medications was also completed.  Any needed refills; flovent; uses albuterol only before exercise, otherwise does not have to use it on a regular basis.  Eating habits: trying to eat heathy  Falls/  MVA accidents in past few months: no  Regular exercise: works in garden; walks her Paediatric nurse pt sees on regular basis: dermatology; recent biopsies; screening for skin cancer  Preventative health issues were discussed.   Additional concerns: discuss recent labs  Married, same sexual partner. No vaginal bleeding or pelvic pain.  Regular vision and dental exams. Mammogram up to date.   Review of Systems  Constitutional:  Negative for activity change and appetite change.  HENT:  Negative for sore throat and trouble swallowing.   Respiratory:  Negative for cough, chest tightness, shortness of breath and wheezing.   Cardiovascular:  Negative for chest pain.  Gastrointestinal:  Negative for abdominal distention, abdominal pain, blood in stool, constipation, diarrhea, nausea and vomiting.  Genitourinary:  Negative for difficulty urinating, dysuria, enuresis, frequency, genital sores, menstrual problem, pelvic pain, urgency, vaginal bleeding and vaginal discharge.  Depression screen PHQ 2/9 09/19/2020  Decreased Interest 0  Down, Depressed, Hopeless 0  PHQ - 2 Score 0  Some encounter information is confidential and restricted. Go to Review Flowsheets activity to see all data.   Has lost 2 close friends recently.Defers need for medication or counseling at this time.     Objective:   Physical Exam Constitutional:      General: She is not in acute distress.    Appearance: She is well-developed.  Neck:     Thyroid: No thyromegaly.     Trachea: No tracheal  deviation.     Comments: Thyroid non tender to palpation. No mass or goiter noted. Very small nodule noted near the isthmus.  Cardiovascular:     Rate and Rhythm: Normal rate and regular rhythm.     Heart sounds: Normal heart sounds. No murmur heard. Pulmonary:     Effort: Pulmonary effort is normal.     Breath sounds: Normal breath sounds.  Chest:  Breasts:    Right: No swelling, inverted nipple, mass, skin change, tenderness, axillary adenopathy or supraclavicular adenopathy.     Left: No swelling, inverted nipple, mass, skin change, tenderness, axillary adenopathy or supraclavicular adenopathy.  Abdominal:     General: There is no distension.     Palpations: Abdomen is soft.     Tenderness: There is no abdominal tenderness.  Genitourinary:    Vagina: Normal.     Comments: External GU: Pelvis signs of atrophy.  Vagina pale and slightly dry.  Cervix normal limit in appearance.  Bimanual exam no tenderness or obvious masses. Musculoskeletal:     Cervical back: Normal range of motion and neck supple.  Lymphadenopathy:     Cervical: No cervical adenopathy.     Upper Body:     Right upper body: No supraclavicular, axillary or pectoral adenopathy.     Left upper body: No supraclavicular, axillary or pectoral adenopathy.  Skin:    General: Skin is warm and dry.     Comments: Several small healing lesions from recent biopsy.    Neurological:     Mental Status: She is alert  and oriented to person, place, and time.  Psychiatric:        Mood and Affect: Mood normal.        Behavior: Behavior normal.        Thought Content: Thought content normal.        Judgment: Judgment normal.  Today's Vitals   09/19/20 1421 09/19/20 1526  BP: (!) 158/97 136/86  Temp: (!) 97.3 F (36.3 C)   TempSrc: Oral   Weight: 181 lb 12.8 oz (82.5 kg)   Height: '5\' 4"'  (1.626 m)    Body mass index is 31.21 kg/m. Results for orders placed or performed in visit on 08/14/20  CBC with Differential/Platelet   Result Value Ref Range   WBC 6.1 3.4 - 10.8 x10E3/uL   RBC 4.26 3.77 - 5.28 x10E6/uL   Hemoglobin 13.8 11.1 - 15.9 g/dL   Hematocrit 41.1 34.0 - 46.6 %   MCV 97 79 - 97 fL   MCH 32.4 26.6 - 33.0 pg   MCHC 33.6 31.5 - 35.7 g/dL   RDW 11.9 11.7 - 15.4 %   Platelets 263 150 - 450 x10E3/uL   Neutrophils 57 Not Estab. %   Lymphs 29 Not Estab. %   Monocytes 10 Not Estab. %   Eos 3 Not Estab. %   Basos 1 Not Estab. %   Neutrophils Absolute 3.5 1.4 - 7.0 x10E3/uL   Lymphocytes Absolute 1.8 0.7 - 3.1 x10E3/uL   Monocytes Absolute 0.6 0.1 - 0.9 x10E3/uL   EOS (ABSOLUTE) 0.2 0.0 - 0.4 x10E3/uL   Basophils Absolute 0.1 0.0 - 0.2 x10E3/uL   Immature Granulocytes 0 Not Estab. %   Immature Grans (Abs) 0.0 0.0 - 0.1 x10E3/uL  Comprehensive metabolic panel  Result Value Ref Range   Glucose 113 (H) 65 - 99 mg/dL   BUN 20 8 - 27 mg/dL   Creatinine, Ser 0.80 0.57 - 1.00 mg/dL   eGFR 84 >59 mL/min/1.73   BUN/Creatinine Ratio 25 12 - 28   Sodium 138 134 - 144 mmol/L   Potassium 4.5 3.5 - 5.2 mmol/L   Chloride 98 96 - 106 mmol/L   CO2 24 20 - 29 mmol/L   Calcium 9.4 8.7 - 10.3 mg/dL   Total Protein 7.2 6.0 - 8.5 g/dL   Albumin 4.9 3.8 - 4.9 g/dL   Globulin, Total 2.3 1.5 - 4.5 g/dL   Albumin/Globulin Ratio 2.1 1.2 - 2.2   Bilirubin Total 0.5 0.0 - 1.2 mg/dL   Alkaline Phosphatase 76 44 - 121 IU/L   AST 25 0 - 40 IU/L   ALT 21 0 - 32 IU/L  Hemoglobin A1c  Result Value Ref Range   Hgb A1c MFr Bld 5.9 (H) 4.8 - 5.6 %   Est. average glucose Bld gHb Est-mCnc 123 mg/dL  Lipid panel  Result Value Ref Range   Cholesterol, Total 241 (H) 100 - 199 mg/dL   Triglycerides 112 0 - 149 mg/dL   HDL 81 >39 mg/dL   VLDL Cholesterol Cal 19 5 - 40 mg/dL   LDL Chol Calc (NIH) 141 (H) 0 - 99 mg/dL   Chol/HDL Ratio 3.0 0.0 - 4.4 ratio  TSH  Result Value Ref Range   TSH 3.390 0.450 - 4.500 uIU/mL  VITAMIN D 25 Hydroxy (Vit-D Deficiency, Fractures)  Result Value Ref Range   Vit D, 25-Hydroxy 27.0 (L)  30.0 - 100.0 ng/mL   Reviewed labs with patient during visit.        Assessment &  Plan:   Problem List Items Addressed This Visit       Endocrine   Hypothyroidism   Relevant Orders   TSH   US THYROID   Impaired fasting glucose     Other   Hyperlipidemia   Relevant Orders   Lipid panel   Other Visit Diagnoses     Well woman exam    -  Primary   Thyroid nodule       Relevant Orders   US THYROID   High risk medication use       Relevant Orders   Hepatic function panel      Meds ordered this encounter  Medications   fluticasone (FLOVENT HFA) 44 MCG/ACT inhaler    Sig: Inhale 2 puffs into the lungs 2 (two) times daily.    Dispense:  1 each    Refill:  6    Order Specific Question:   Supervising Provider    Answer:   Kathyrn Drown [phone]  Reviewed recent labs with patient.  Discussed concerns.  Her LDL cholesterol went from 98-1 41.  Patient has been eating healthy overall.  Does have a strong family history of hyperlipidemia.  Increase rosuvastatin to 10 mg, take 2 of the 5 mg that she has at home just received a 90-day supply. Continue vitamin D supplement.  Blood sugar and A1c remain stable.  Continue weight loss efforts and activity. Continue follow-up with dermatology for skin cancer screening. Adherent to levothyroxine, TSH very slightly with lab testing.  Plan to repeat lipid, liver and TSH in 8 to 10 weeks. Return in about 6 months (around 03/22/2021).

## 2020-09-19 NOTE — Patient Instructions (Signed)
Increase Rosuvastatin to 10 mg and repeat labs in 8 weeks Consider getting Pneumonia vaccine  Consider shingles vaccine (2 doses)

## 2020-09-20 ENCOUNTER — Other Ambulatory Visit: Payer: Self-pay | Admitting: Nurse Practitioner

## 2020-09-20 ENCOUNTER — Encounter: Payer: Self-pay | Admitting: Nurse Practitioner

## 2020-09-20 MED ORDER — ROSUVASTATIN CALCIUM 10 MG PO TABS: 10.0000 mg | ORAL_TABLET | Freq: Every day | ORAL | 0 refills | Status: DC

## 2020-09-20 MED ORDER — FLUTICASONE PROPIONATE HFA 44 MCG/ACT IN AERO: 2.0000 | INHALATION_SPRAY | Freq: Two times a day (BID) | RESPIRATORY_TRACT | 6 refills | Status: DC

## 2020-09-30 ENCOUNTER — Ambulatory Visit (HOSPITAL_COMMUNITY)
Admission: RE | Admit: 2020-09-30 | Discharge: 2020-09-30 | Disposition: A | Discharge status: Home / Self Care | Payer: 59 | Source: Ambulatory Visit | Attending: Nurse Practitioner | Admitting: Nurse Practitioner

## 2020-09-30 ENCOUNTER — Other Ambulatory Visit: Payer: Self-pay

## 2020-09-30 DIAGNOSIS — E041 Nontoxic single thyroid nodule: Secondary | ICD-10-CM | POA: Insufficient documentation

## 2020-09-30 DIAGNOSIS — E063 Autoimmune thyroiditis: Secondary | ICD-10-CM | POA: Insufficient documentation

## 2020-09-30 DIAGNOSIS — E038 Other specified hypothyroidism: Secondary | ICD-10-CM | POA: Insufficient documentation

## 2020-09-30 IMAGING — US US THYROID
1 series · 14 of 25 positions shown · non-contrast
Comparison: None.

CLINICAL DATA: Hypothyroidism, remote Hashimoto's thyroiditis

EXAM:
THYROID ULTRASOUND
TECHNIQUE: Ultrasound examination of the thyroid gland and adjacent soft
tissues was performed.

[Series 1: us thyroid · 14 of 48 slices shown]
[im 1/48]
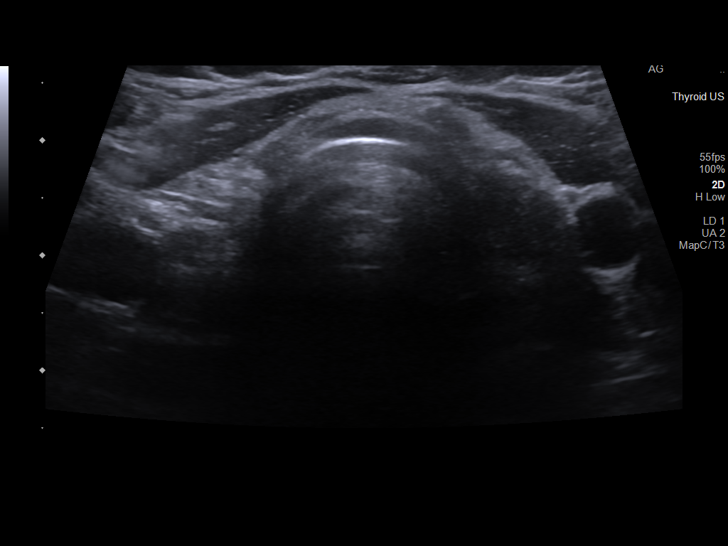
[im 4/48]
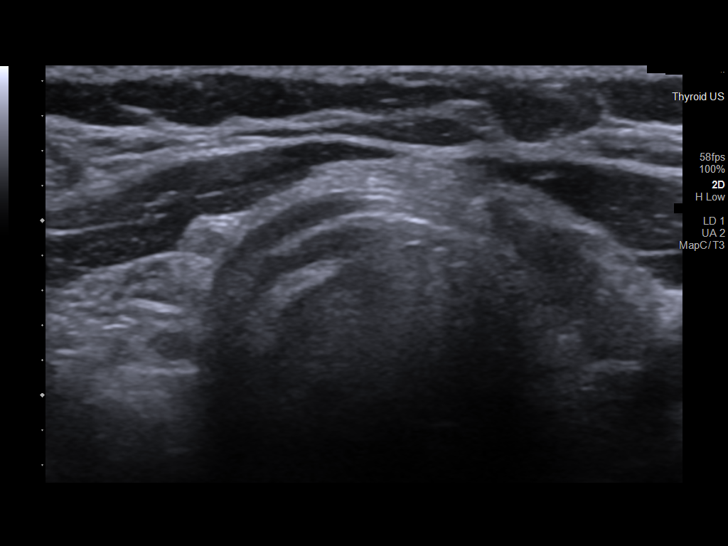
[im 8/48]
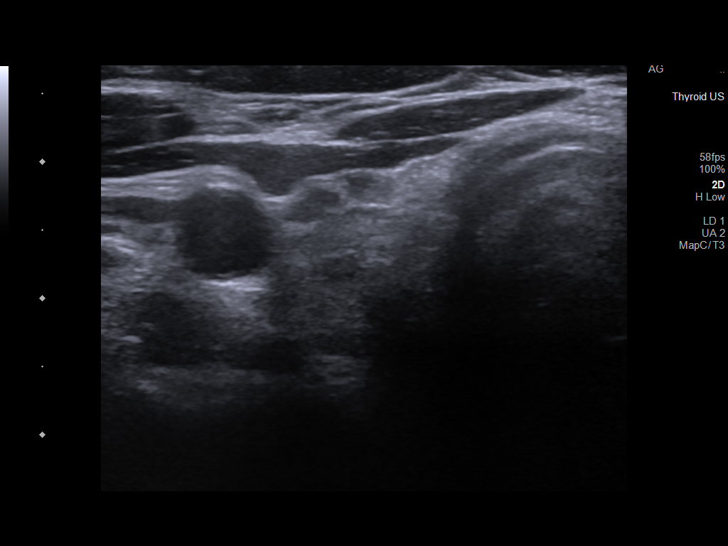
[im 12/48]
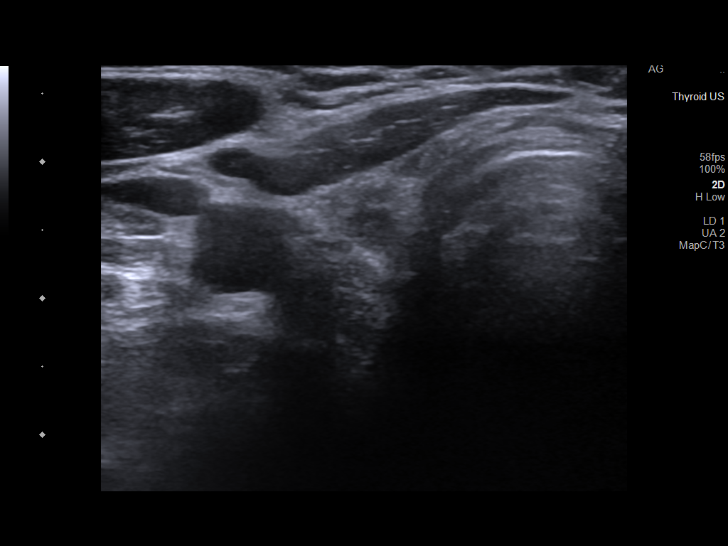
[im 16/48]
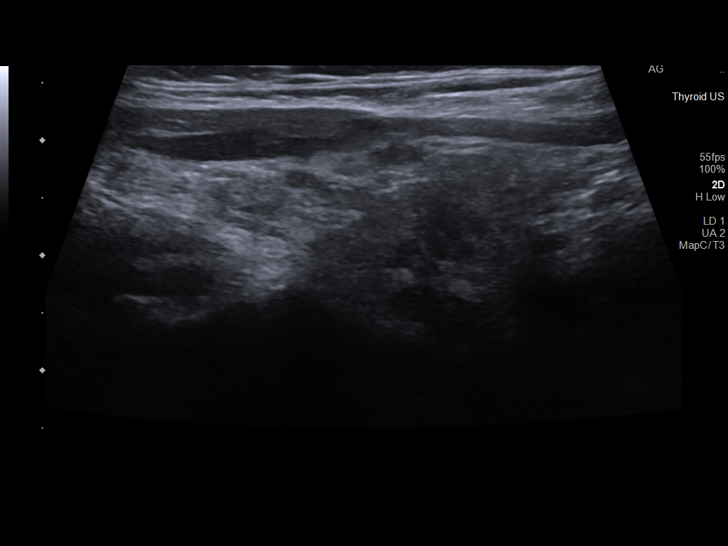
[im 18/48]
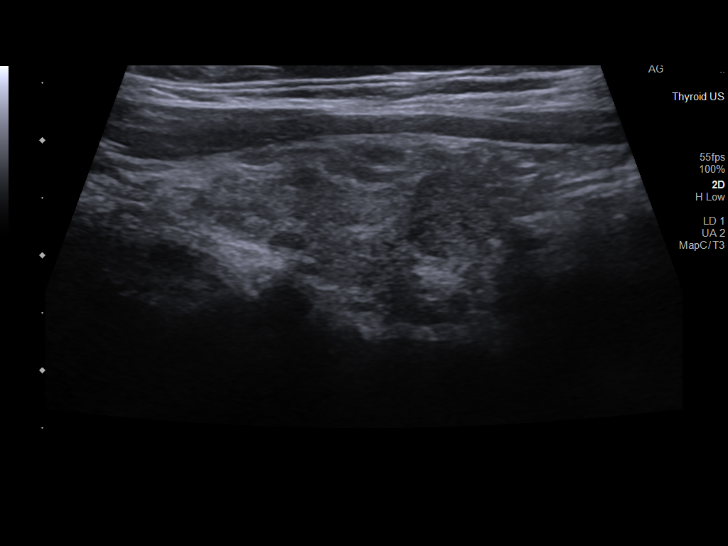
[im 22/48]
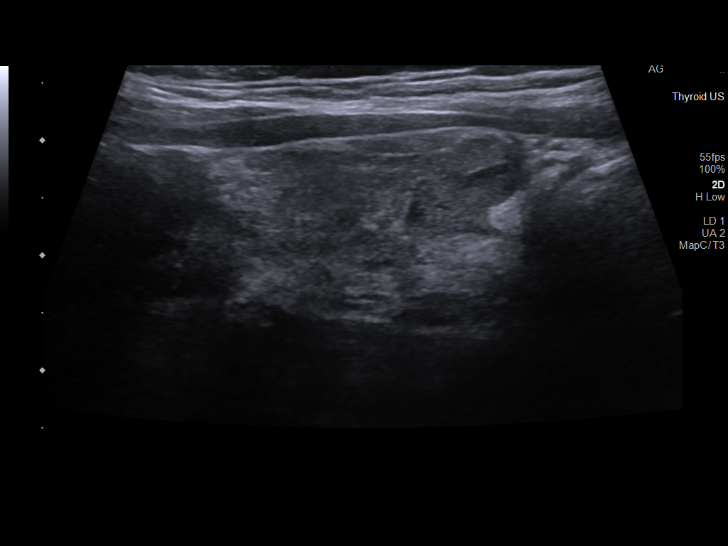
[im 26/48]
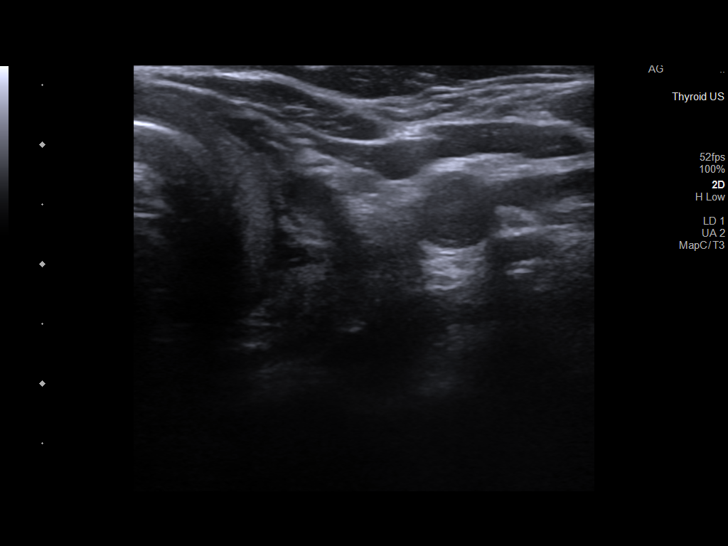
[im 30/48]
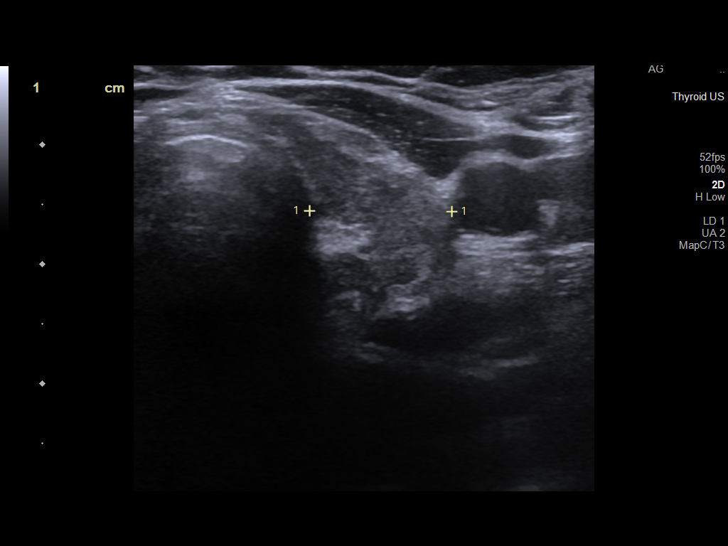
[im 32/48]
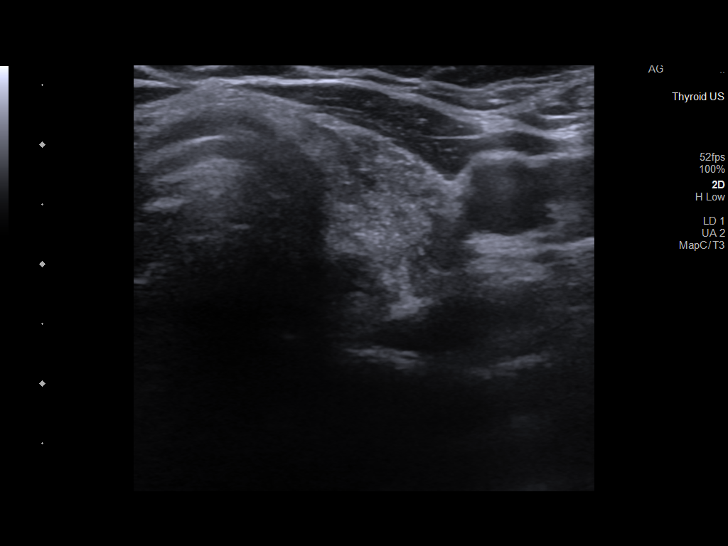
[im 36/48]
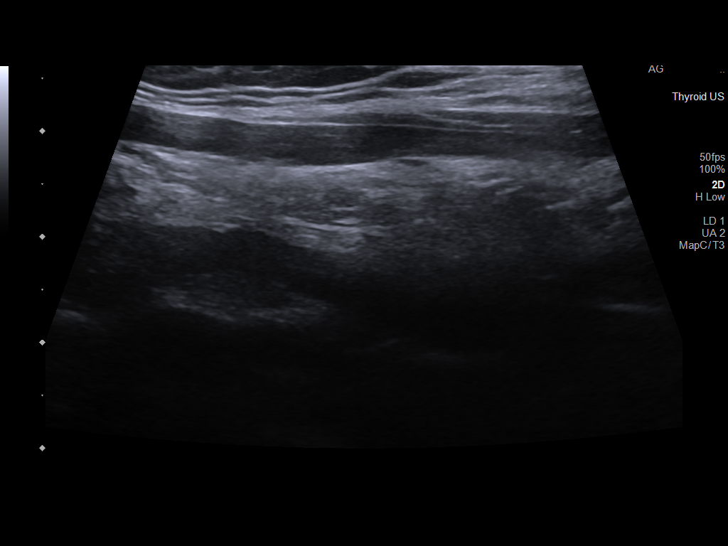
[im 40/48]
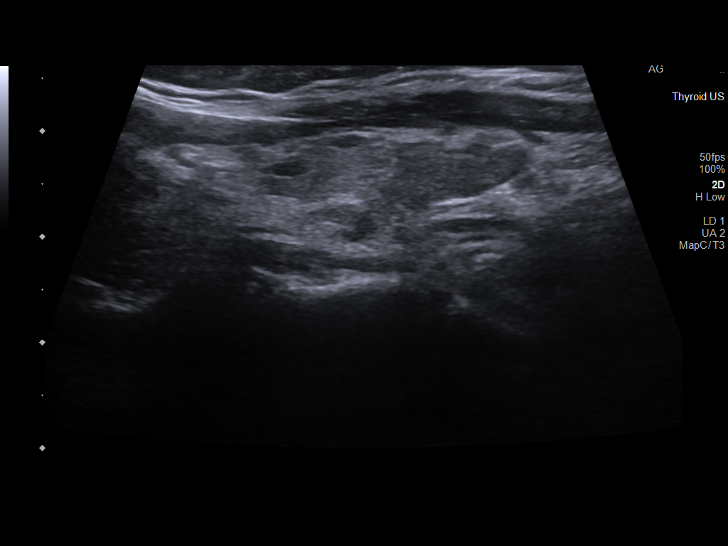
[im 44/48]
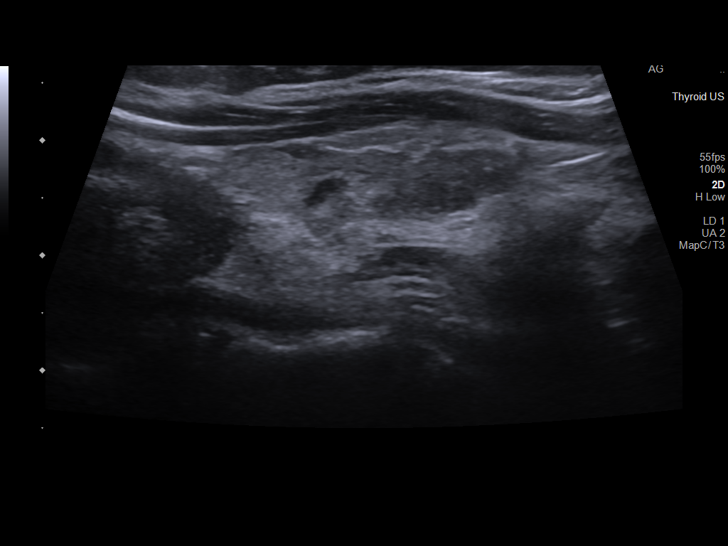
[im 48/48]
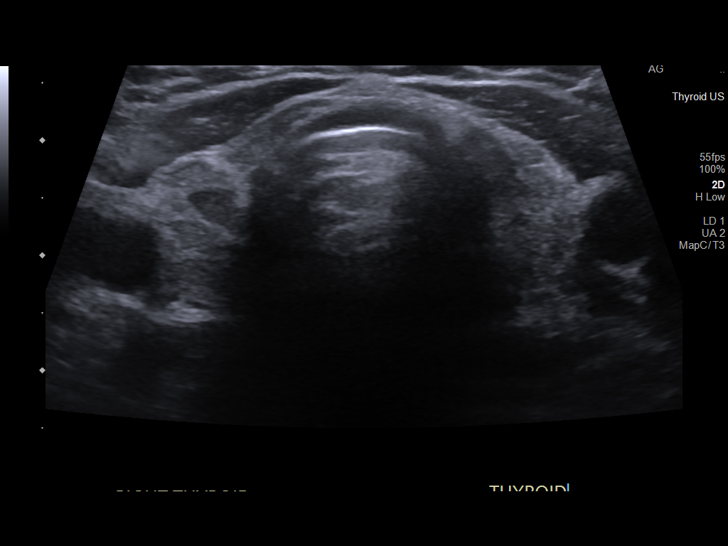

[14 of 25 positions shown; findings below may reference images not displayed]

FINDINGS: Parenchymal Echotexture: Markedly heterogenous

Isthmus: 2 mm

Right lobe: 3.8 x 1.5 x 1.1 cm

Left lobe: 2.9 x 1.2 x 1.2 cm

_________________________________________________________

Estimated total number of nodules >/= 1 cm: 0

Number of spongiform nodules >/=  2 cm not described below (TR1): 0

Number of mixed cystic and solid nodules >/= 1.5 cm not described
below (TR2): 0

_________________________________________________________

Atrophy and heterogeneity noted of the thyroid gland diffusely
compatible with sequelae from prior thyroiditis/chronic medical
thyroid disease. No hypervascularity. No discrete nodule or focal
abnormality. No regional adenopathy.
IMPRESSION: Atrophic and heterogeneous thyroid compatible with chronic medical
thyroid disease. Negative for nodule.

The above is in keeping with the ACR TI-RADS recommendations - [HOSPITAL] [2Q];[DATE].

## 2020-12-25 ENCOUNTER — Encounter: Payer: Self-pay | Admitting: *Deleted

## 2021-01-01 ENCOUNTER — Encounter: Payer: Self-pay | Admitting: *Deleted

## 2021-01-01 ENCOUNTER — Ambulatory Visit (INDEPENDENT_AMBULATORY_CARE_PROVIDER_SITE_OTHER): Payer: 59 | Admitting: *Deleted

## 2021-01-01 VITALS — Ht 64.0 in | Wt 165.0 lb

## 2021-01-01 DIAGNOSIS — Z1211 Encounter for screening for malignant neoplasm of colon: Secondary | ICD-10-CM

## 2021-01-01 NOTE — Progress Notes (Signed)
Appropriate. ASA 2.  

## 2021-01-01 NOTE — Progress Notes (Signed)
Gastroenterology Pre-Procedure Review  Request Date: 01/01/2021 Requesting Physician: 10 year recall, Last TCS done 02/05/2011 by Dr. Oneida Alar, internal hemorrhoids, family hx of colon cancer (aunt)  PATIENT REVIEW QUESTIONS: The patient responded to the following health history questions as indicated:    1. Diabetes Melitis: no 2. Joint replacements in the past 12 months: no 3. Major health problems in the past 3 months: no 4. Has an artificial valve or MVP: no 5. Has a defibrillator: no 6. Has been advised in past to take antibiotics in advance of a procedure like teeth cleaning: no 7. Family history of colon cancer: yes, aunt: age late 59's  8. Alcohol Use: yes, 1 glass of wine daily 9. Illicit drug Use: no 10. History of sleep apnea: no  11. History of coronary artery or other vascular stents placed within the last 12 months: no 12. History of any prior anesthesia complications: no 13. Body mass index is 28.32 kg/m.    MEDICATIONS & ALLERGIES:    Patient reports the following regarding taking any blood thinners:   Plavix? no Aspirin? no Coumadin? no Brilinta? no Xarelto? no Eliquis? no Pradaxa? no Savaysa? no Effient? no  Patient confirms/reports the following medications:  Current Outpatient Medications  Medication Sig Dispense Refill   albuterol (VENTOLIN HFA) 108 (90 Base) MCG/ACT inhaler INHALE 2 PUFFS INTO THE LUNGS EVERY 4 HOURS AS NEEDED FOR WHEEZING OR SHORTNESS OF BREATH. (Patient taking differently: 2 puffs as needed. INHALE 2 PUFFS INTO THE LUNGS EVERY 4 HOURS AS NEEDED FOR WHEEZING OR SHORTNESS OF BREATH.) 54 g 0   cholecalciferol (VITAMIN D) 1000 UNITS tablet Take 1,000 Units by mouth daily.       fluticasone (FLOVENT HFA) 44 MCG/ACT inhaler Inhale 2 puffs into the lungs 2 (two) times daily. 1 each 6   ketoconazole (NIZORAL) 2 % cream Apply 1 application topically 2 (two) times daily. (Patient taking differently: Apply 1 application topically as needed.) 15 g 5    levothyroxine (SYNTHROID) 112 MCG tablet Take 1 tablet by mouth once daily 90 tablet 0   loratadine (CLARITIN) 10 MG tablet Take 10 mg by mouth daily.     Multiple Vitamin (MULTIVITAMIN) tablet Take 1 tablet by mouth daily. WITH IRON AND VITAMIN D     ondansetron (ZOFRAN ODT) 4 MG disintegrating tablet Take one tablet by mouth every 6-8 hours as needed for nausea (Patient taking differently: as needed. Take one tablet by mouth every 6-8 hours as needed for nausea) 20 tablet 0   rosuvastatin (CRESTOR) 10 MG tablet Take 1 tablet (10 mg total) by mouth daily. 90 tablet 0   triamcinolone (NASACORT ALLERGY 24HR) 55 MCG/ACT AERO nasal inhaler Place 2 sprays into the nose daily.     valACYclovir (VALTREX) 1000 MG tablet Take 1 tablet by mouth three times a day (Patient taking differently: as needed. Takes as needed for fever blisters.) 90 tablet 0   vitamin C (ASCORBIC ACID) 500 MG tablet Take 500 mg by mouth daily.     No current facility-administered medications for this visit.    Patient confirms/reports the following allergies:  Allergies  Allergen Reactions   Latex    Betadine [Povidone Iodine] Rash    No orders of the defined types were placed in this encounter.   AUTHORIZATION INFORMATION Primary Insurance: [facility],  ID #: [id number],  Group #: [id number] Pre-Cert / Josem Kaufmann required: No, not required  SCHEDULE INFORMATION: Procedure has been scheduled as follows:  Date: 01/20/2021, Time: 2:00  Location:  [facility] with Dr. Abbey Chatters  This Gastroenterology Pre-Precedure Review Form is being routed to the following provider(s):  Roseanne Kaufman, NP

## 2021-01-14 ENCOUNTER — Other Ambulatory Visit: Payer: Self-pay | Admitting: Nurse Practitioner

## 2021-01-14 ENCOUNTER — Telehealth: Payer: Self-pay | Admitting: Internal Medicine

## 2021-01-14 ENCOUNTER — Telehealth: Payer: Self-pay | Admitting: *Deleted

## 2021-01-14 MED ORDER — NA SULFATE-K SULFATE-MG SULF 17.5-3.13-1.6 GM/177ML PO SOLN: 1.0000 | Freq: Once | ORAL | 0 refills | Status: DC

## 2021-01-14 NOTE — Telephone Encounter (Signed)
Spoke to pt and she is aware that I sent RX.  She is going to check with pharmacy.  She wanted to make sure that she could take her breathing meds morning of procedure.  Confirmed that she could.

## 2021-01-14 NOTE — Telephone Encounter (Signed)
Spoke to pt.  She wanted to know if the prep kits should have been covered at 100%.  She said Suprep was over $100.  Informed her that the prep kits aren't covered at 100% most of the time.  She informed me that she would pay it because she would like to keep the prep kit.

## 2021-01-14 NOTE — Addendum Note (Signed)
Addended by: Metro Kung on: 01/14/2021 09:18 AM   Modules accepted: Orders

## 2021-01-14 NOTE — Telephone Encounter (Signed)
Pt has questions about her procedure and also said that her pharmacy hasn't gotten her prep prescription yet. Please call [phone]

## 2021-01-14 NOTE — Telephone Encounter (Signed)
Amy Shelton

## 2021-01-19 ENCOUNTER — Telehealth: Payer: Self-pay | Admitting: *Deleted

## 2021-01-19 NOTE — Telephone Encounter (Signed)
Spoke to pt and offered to move her procedure time up due to cancellations.  Pt declined and informed me that she would like to keep her procedure time as scheduled.  Informed Kim in Endo.

## 2021-01-20 ENCOUNTER — Telehealth: Payer: Self-pay | Admitting: *Deleted

## 2021-01-20 ENCOUNTER — Ambulatory Visit (HOSPITAL_COMMUNITY): Admission: RE | Admit: 2021-01-20 | Payer: 59 | Source: Home / Self Care

## 2021-01-20 ENCOUNTER — Other Ambulatory Visit: Payer: Self-pay

## 2021-01-20 ENCOUNTER — Ambulatory Visit (INDEPENDENT_AMBULATORY_CARE_PROVIDER_SITE_OTHER): Payer: 59 | Admitting: Family Medicine

## 2021-01-20 ENCOUNTER — Encounter (HOSPITAL_COMMUNITY): Admission: RE | Payer: Self-pay | Source: Home / Self Care

## 2021-01-20 VITALS — BP 144/92 | HR 74 | Temp 97.2°F | Ht 64.0 in | Wt 174.0 lb

## 2021-01-20 DIAGNOSIS — R7303 Prediabetes: Secondary | ICD-10-CM

## 2021-01-20 DIAGNOSIS — E785 Hyperlipidemia, unspecified: Secondary | ICD-10-CM | POA: Diagnosis not present

## 2021-01-20 DIAGNOSIS — R002 Palpitations: Secondary | ICD-10-CM

## 2021-01-20 DIAGNOSIS — E038 Other specified hypothyroidism: Secondary | ICD-10-CM | POA: Diagnosis not present

## 2021-01-20 DIAGNOSIS — E063 Autoimmune thyroiditis: Secondary | ICD-10-CM

## 2021-01-20 SURGERY — COLONOSCOPY WITH PROPOFOL
Anesthesia: Monitor Anesthesia Care

## 2021-01-20 NOTE — Telephone Encounter (Signed)
Noted, thanks!

## 2021-01-20 NOTE — Progress Notes (Signed)
   Subjective:    Patient ID: Amy Shelton, female    DOB: [birth year], 60 y.o.   MRN: [id number]  HPI Patient had an episode of elevated HR, and BP elevation after taking Sureprep in preparing for a colonoscopy   She relates that she started having a very fast heart rate last evening that was irregular.  Her husband who is a physician stated that it seemed to be atrial fibrillation with rapid ventricular response.  Heart rate was approximately 170-180.  This lasted for almost 2 hours.  It resolved on its own while they were waiting to be seen in the emergency department at [facility]  She denies any chest pressure tightness shortness of breath sweating spells no history of swelling.  Does not use excessive caffeine.  Does not take any type of OTC supplements that would cause this.  Patient had an recently taken note bowel prep earlier that evening for a colonoscopy that was supposed to be done today  She does have underlying history of central obesity, hyperlipidemia, borderline blood pressure issues as well as prediabetes and thyroid.  Essentially has findings of metabolic syndrome  Review of Systems     Objective:   Physical Exam Lungs are clear respiratory rate is normal Blood pressure retaken several times just reading 144/92 Heart regular no murmurs no arrhythmia detected Extremities no edema skin warm dry  EKG does not show any acute ST segment changes.  Does show minimal QRS complex in V1 V2 The computer is over reading this as a possible previous anterior infarct       Assessment & Plan:  1. Palpitations It does sound like she had atrial fibrillation with rapid ventricular response which resolved on its own.  Currently we will not start her on any medications.  Does not need to be on blood thinner currently.  Will benefit from seeing cardiology for further evaluation including home monitoring/telemetry.  May or may not need echo given borderline blood pressure and metabolic  syndrome - EKG 33-LKTG - TSH + free T4 - Lipid panel - Basic metabolic panel - Magnesium - Hepatic function panel - Hemoglobin A1c - Ambulatory referral to Cardiology  2. Hypothyroidism due to Hashimoto's thyroiditis Will check lab work to see where her current thyroid function is may need adjustments depending on this - TSH + free T4 - Lipid panel - Basic metabolic panel - Magnesium - Hepatic function panel - Hemoglobin A1c  3. Hyperlipidemia, unspecified hyperlipidemia type Lab work ordered.  Continue medication - TSH + free T4 - Lipid panel - Basic metabolic panel - Magnesium - Hepatic function panel - Hemoglobin A1c  4. Prediabetes Portion control minimize starches stay physically active lab work ordered - TSH + free T4 - Lipid panel - Basic metabolic panel - Magnesium - Hepatic function panel - Hemoglobin A1c  She uses occasional alcohol gnot on a regular basis does not smoke  She will send [place] an update later this week on how her blood pressures are doing if she has recurrent spells of this she will notify us

## 2021-01-20 NOTE — Telephone Encounter (Signed)
Pt informed me that she needed to cancel procedure for today.  She said after she drank Suprep (1st bottle) last night, her heart rate went up to 180.  She said her husband thinks she might have went into Afib.  She went to ER last night. She said after an hour or so that her heart rate went back down.   Pt did not do second bottle this morning because of this. She is following up with her PCP today.  Informed Kim in Day Surgery.  Pt said that she may revisit having procedure again down the road maybe after the first of the year but wants to hold off for now.  She thinks she may have had a reaction to the Suprep.  I apologized to pt.  She said that we would not have had any way of knowing that this would have happened.

## 2021-01-20 NOTE — Progress Notes (Signed)
Cardiology Office Note:    Date:  01/21/2021   ID:  Amy Shelton, DOB March 11, 1960, MRN [id number]  PCP:  Kathyrn Drown, MD  Cardiologist:  None  Electrophysiologist:  None   Referring MD: Kathyrn Drown, MD   Chief Complaint/Reason for Referral: Palpitations  History of Present Illness:    Amy Shelton is a 60 y.o. female with the indicated history was seen recently in her primary care provider's office.  She is reporting palpitations around the time of a prep for a colonoscopy.  2 EKGs that I reviewed demonstrated normal sinus rhythm performed during that visit.  Her husband who is a retired physician took her pulse and found it to be irregularly irregular with a rate 170-180s.  The colonoscopy was canceled.  She tells me she has never had any symptoms of this before.  She denies any exertional angina, presyncope, syncope, paroxysmal nocturnal dyspnea, orthopnea.  They have noticed that her blood pressure has been elevated over the last few months and there are plans to perhaps start treating this.  Past Medical History:  Diagnosis Date   Anemia    related to heavy vaginal bleeding-has resolved   Hypothyroidism   Hyperlipidemia Prediabetes   Past Surgical History:  Procedure Laterality Date   BREAST CYST EXCISION Left 1991   fibroadenoma removed    COLONOSCOPY  02/05/2011   Procedure: COLONOSCOPY;  Surgeon: Dorothyann Peng, MD;  Location: [facility];  Service: Endoscopy;  Laterality: N/A;  9:30 AM    Current Medications: Current Meds  Medication Sig   albuterol (VENTOLIN HFA) 108 (90 Base) MCG/ACT inhaler INHALE 2 PUFFS INTO THE LUNGS EVERY 4 HOURS AS NEEDED FOR WHEEZING OR SHORTNESS OF BREATH. (Patient taking differently: Inhale 2 puffs into the lungs every 4 (four) hours as needed for wheezing or shortness of breath. INHALE 2 PUFFS INTO THE LUNGS EVERY 4 HOURS AS NEEDED FOR WHEEZING OR SHORTNESS OF BREATH.)   cholecalciferol (VITAMIN D) 1000 UNITS tablet Take 1,000  Units by mouth daily.     fluticasone (FLOVENT HFA) 44 MCG/ACT inhaler Inhale 2 puffs into the lungs 2 (two) times daily.   ketoconazole (NIZORAL) 2 % cream Apply 1 application topically 2 (two) times daily. (Patient taking differently: Apply 1 application topically daily as needed for irritation.)   levothyroxine (SYNTHROID) 112 MCG tablet Take 1 tablet by mouth once daily   loratadine (CLARITIN) 10 MG tablet Take 10 mg by mouth daily.   Multiple Vitamin (MULTIVITAMIN) tablet Take 1 tablet by mouth daily. WITH IRON AND VITAMIN D   ondansetron (ZOFRAN ODT) 4 MG disintegrating tablet Take one tablet by mouth every 6-8 hours as needed for nausea   rosuvastatin (CRESTOR) 10 MG tablet Take 1 tablet (10 mg total) by mouth daily.   triamcinolone (NASACORT) 55 MCG/ACT AERO nasal inhaler Place 2 sprays into the nose daily.   valACYclovir (VALTREX) 1000 MG tablet Take 1 tablet by mouth three times a day (Patient taking differently: Take 1,000 mg by mouth daily as needed (fever blisters). Takes as needed for fever blisters.)   vitamin C (ASCORBIC ACID) 500 MG tablet Take 500 mg by mouth daily.     Allergies:    Allergies  Allergen Reactions   Betadine [Povidone Iodine] Rash   Latex Rash    Social History   Tobacco Use   Smoking status: Never   Smokeless tobacco: Never  Vaping Use   Vaping Use: Never used  Substance Use Topics  Alcohol use: Yes    Alcohol/week: 7.0 standard drinks    Types: 7 Glasses of wine per week   Drug use: No     Family History: Family History  Problem Relation Age of Onset   Hyperlipidemia Mother    Breast cancer Mother 73   Hyperlipidemia Father    Hypertension Father    Kidney disease Father    Colon cancer Maternal Aunt      ROS:   Please see the history of present illness.    All other systems reviewed and are negative.  EKGs/Labs/Other Studies Reviewed:    The following studies were reviewed today:  EKG: Sinus rhythm  Imaging studies that I  have independently reviewed today: No cardiac imaging performed yet  Recent Labs: 09/09/2020: ALT 21; BUN 20; Creatinine, Ser 0.80; Hemoglobin 13.8; Platelets 263; Potassium 4.5; Sodium 138; TSH 3.390  Recent Lipid Panel    Component Value Date/Time   CHOL 241 (H) 09/09/2020 0751   TRIG 112 09/09/2020 0751   HDL 81 09/09/2020 0751   CHOLHDL 3.0 09/09/2020 0751   CHOLHDL 3.2 02/25/2014 0747   VLDL 15 02/25/2014 0747   LDLCALC 141 (H) 09/09/2020 0751    Physical Exam:    VS:  BP (!) 140/94   Pulse 63   Ht 5\' 4"  (1.626 m)   Wt 177 lb (80.3 kg)   LMP 01/05/2011   SpO2 98%   BMI 30.38 kg/m     Wt Readings from Last 5 Encounters:  01/21/21 177 lb (80.3 kg)  01/20/21 174 lb (78.9 kg)  01/01/21 165 lb (74.8 kg)  09/19/20 181 lb 12.8 oz (82.5 kg)  07/27/19 189 lb 3.2 oz (85.8 kg)    GENERAL:  No apparent distress, AOx3 HEENT:  No carotid bruits, +2 carotid impulses, no scleral icterus CAR: RRR no murmurs, gallops, rubs, or thrills RES:  Clear to auscultation bilaterally ABD:  Soft, nontender, nondistended, positive bowel sounds x 4 VASC:  +2 radial pulses, +2 carotid pulses, palpable pedal pulses NEURO:  CN 2-12 grossly intact; motor and sensory grossly intact PSYCH:  No active depression or anxiety EXT:  No edema, ecchymosis, or cyanosis  ASSESSMENT:    1. Palpitations   2. Hyperlipidemia, unspecified hyperlipidemia type   3. Hypertension, unspecified type    PLAN:    Palpitations I will obtain an echocardiogram and 2-week monitor.  The echocardiogram may shed light on any pro arrhythmogenic substrate such as atrial enlargement or left ventricular hypertrophy.  I did recommend that the patient obtain a watch with telemetric monitoring in case the 2-week monitor is negative.  I will see her back in 4 months or earlier if needed.  Hyperlipidemia, unspecified hyperlipidemia type This is being followed by her primary care provider  Hypertension Her blood pressure is  elevated today.  I would not react to this isolated number.  The next time she sees any of her providers if her blood pressure is still elevated it should be treated.    Shared Decision Making/Informed Consent:       Medication Adjustments/Labs and Tests Ordered: Current medicines are reviewed at length with the patient today.  Concerns regarding medicines are outlined above.   Orders Placed This Encounter  Procedures   LONG TERM MONITOR (3-14 DAYS)   ECHOCARDIOGRAM COMPLETE     No orders of the defined types were placed in this encounter.   Patient Instructions  Medication Instructions:  Your physician recommends that you continue on your current medications  as directed. Please refer to the Current Medication list given to you today.  *If you need a refill on your cardiac medications before your next appointment, please call your pharmacy*   Lab Work: NONE  If you have labs (blood work) drawn today and your tests are completely normal, you will receive your results only by: [place] (if you have MyChart) OR A paper copy in the mail If you have any lab test that is abnormal or we need to change your treatment, we will call you to review the results.   Testing/Procedures: Your physician has requested that you have an echocardiogram. Echocardiography is a painless test that uses sound waves to create images of your heart. It provides your doctor with information about the size and shape of your heart and how well your heart's chambers and valves are working. This procedure takes approximately one hour. There are no restrictions for this procedure.   ZIO XT- Long Term Monitor Instructions  Your physician has requested you wear a ZIO patch monitor for 14 days.  This is a single patch monitor. [employer] supplies one patch monitor per enrollment. Additional stickers are not available. Please do not apply patch if you will be having a Nuclear Stress Test,  Echocardiogram,  Cardiac CT, MRI, or Chest Xray during the period you would be wearing the  monitor. The patch cannot be worn during these tests. You cannot remove and re-apply the  ZIO XT patch monitor.  Your ZIO patch monitor will be mailed 3 day USPS to your address on file. It may take 3-5 days  to receive your monitor after you have been enrolled.  Once you have received your monitor, please review the enclosed instructions. Your monitor  has already been registered assigning a specific monitor serial # to you.  Billing and Patient Assistance Program Information  We have supplied [employer] with any of your insurance information on file for billing purposes. [employer] offers a sliding scale Patient Assistance Program for patients that do not have  insurance, or whose insurance does not completely cover the cost of the ZIO monitor.  You must apply for the Patient Assistance Program to qualify for this discounted rate.  To apply, please call [employer] at [phone], select option 4, select option 2, ask to apply for  Patient Assistance Program. Theodore Demark will ask your household income, and how many people  are in your household. They will quote your out-of-pocket cost based on that information.  [employer] will also be able to set up a 63-month, interest-free payment plan if needed.  Applying the monitor   Shave hair from upper left chest.  Hold abrader disc by orange tab. Rub abrader in 40 strokes over the upper left chest as  indicated in your monitor instructions.  Clean area with 4 enclosed alcohol pads. Let dry.  Apply patch as indicated in monitor instructions. Patch will be placed under collarbone on left  side of chest with arrow pointing upward.  Rub patch adhesive wings for 2 minutes. Remove white label marked "1". Remove the white  label marked "2". Rub patch adhesive wings for 2 additional minutes.  While looking in a mirror, press and release button in center of patch. A small green light will   flash 3-4 times. This will be your only indicator that the monitor has been turned on.  Do not shower for the first 24 hours. You may shower after the first 24 hours.  Press the button if you feel a  symptom. You will hear a small click. Record Date, Time and  Symptom in the Patient Logbook.  When you are ready to remove the patch, follow instructions on the last 2 pages of Patient  Logbook. Stick patch monitor onto the last page of Patient Logbook.  Place Patient Logbook in the blue and white box. Use locking tab on box and tape box closed  securely. The blue and white box has prepaid postage on it. Please place it in the mailbox as  soon as possible. Your physician should have your test results approximately 7 days after the  monitor has been mailed back to [facility].  Call [place] at [phone] if you have questions regarding  your ZIO XT patch monitor. Call them immediately if you see an orange light blinking on your  monitor.  If your monitor falls off in less than 4 days, contact our Monitor department at [phone].  If your monitor becomes loose or falls off after 4 days call [employer] at [phone] for  suggestions on securing your monitor   Follow-Up: At [facility], you and your health needs are our priority.  As part of our continuing mission to provide you with exceptional heart care, we have created designated Provider Care Teams.  These Care Teams include your primary Cardiologist (physician) and Advanced Practice Providers (APPs -  Physician Assistants and Nurse Practitioners) who all work together to provide you with the care you need, when you need it.  We recommend signing up for the patient portal called "MyChart".  Sign up information is provided on this After Visit Summary.  MyChart is used to connect with patients for Virtual Visits (Telemedicine).  Patients are able to view lab/test results, encounter notes, upcoming appointments, etc.   Non-urgent messages can be sent to your provider as well.   To learn more about what you can do with MyChart, go to [email or web address].    Your next appointment:   4 month(s)  The format for your next appointment:   In Person  Provider:   DR. Lenna Sciara    Other Instructions Echocardiogram An echocardiogram is a test that uses sound waves (ultrasound) to produce images of the heart. Images from an echocardiogram can provide important information about: Heart size and shape. The size and thickness and movement of your heart's walls. Heart muscle function and strength. Heart valve function or if you have stenosis. Stenosis is when the heart valves are too narrow. If blood is flowing backward through the heart valves (regurgitation). A tumor or infectious growth around the heart valves. Areas of heart muscle that are not working well because of poor blood flow or injury from a heart attack. Aneurysm detection. An aneurysm is a weak or damaged part of an artery wall. The wall bulges out from the normal force of blood pumping through the body. Tell a health care provider about: Any allergies you have. All medicines you are taking, including vitamins, herbs, eye drops, creams, and over-the-counter medicines. Any blood disorders you have. Any surgeries you have had. Any medical conditions you have. Whether you are pregnant or may be pregnant. What are the risks? Generally, this is a safe test. However, problems may occur, including an allergic reaction to dye (contrast) that may be used during the test. What happens before the test? No specific preparation is needed. You may eat and drink normally. What happens during the test?  You will take off your clothes from the waist up and put on  a hospital gown. Electrodes or electrocardiogram (ECG)patches may be placed on your chest. The electrodes or patches are then connected to a device that monitors your heart rate and  rhythm. You will lie down on a table for an ultrasound exam. A gel will be applied to your chest to help sound waves pass through your skin. A handheld device, called a transducer, will be pressed against your chest and moved over your heart. The transducer produces sound waves that travel to your heart and bounce back (or "echo" back) to the transducer. These sound waves will be captured in real-time and changed into images of your heart that can be viewed on a video monitor. The images will be recorded on a computer and reviewed by your health care provider. You may be asked to change positions or hold your breath for a short time. This makes it easier to get different views or better views of your heart. In some cases, you may receive contrast through an IV in one of your veins. This can improve the quality of the pictures from your heart. The procedure may vary among health care providers and hospitals. What can I expect after the test? You may return to your normal, everyday life, including diet, activities, and medicines, unless your health care provider tells you not to do that. Follow these instructions at home: It is up to you to get the results of your test. Ask your health care provider, or the department that is doing the test, when your results will be ready. Keep all follow-up visits. This is important. Summary An echocardiogram is a test that uses sound waves (ultrasound) to produce images of the heart. Images from an echocardiogram can provide important information about the size and shape of your heart, heart muscle function, heart valve function, and other possible heart problems. You do not need to do anything to prepare before this test. You may eat and drink normally. After the echocardiogram is completed, you may return to your normal, everyday life, unless your health care provider tells you not to do that. This information is not intended to replace advice given to you by your  health care provider. Make sure you discuss any questions you have with your health care provider. Document Revised: 11/05/2020 Document Reviewed: 10/16/2019 Elsevier Patient Education  2022 [employer].

## 2021-01-21 ENCOUNTER — Encounter: Payer: Self-pay | Admitting: Internal Medicine

## 2021-01-21 ENCOUNTER — Ambulatory Visit (INDEPENDENT_AMBULATORY_CARE_PROVIDER_SITE_OTHER): Payer: 59

## 2021-01-21 ENCOUNTER — Ambulatory Visit (INDEPENDENT_AMBULATORY_CARE_PROVIDER_SITE_OTHER): Payer: 59 | Admitting: Internal Medicine

## 2021-01-21 VITALS — BP 140/94 | HR 63 | Ht 64.0 in | Wt 177.0 lb

## 2021-01-21 DIAGNOSIS — I10 Essential (primary) hypertension: Secondary | ICD-10-CM

## 2021-01-21 DIAGNOSIS — R002 Palpitations: Secondary | ICD-10-CM

## 2021-01-21 DIAGNOSIS — E785 Hyperlipidemia, unspecified: Secondary | ICD-10-CM | POA: Diagnosis not present

## 2021-01-21 NOTE — Patient Instructions (Signed)
Medication Instructions:  Your physician recommends that you continue on your current medications as directed. Please refer to the Current Medication list given to you today.  *If you need a refill on your cardiac medications before your next appointment, please call your pharmacy*   Lab Work: NONE  If you have labs (blood work) drawn today and your tests are completely normal, you will receive your results only by: [facility] (if you have MyChart) OR A paper copy in the mail If you have any lab test that is abnormal or we need to change your treatment, we will call you to review the results.   Testing/Procedures: Your physician has requested that you have an echocardiogram. Echocardiography is a painless test that uses sound waves to create images of your heart. It provides your doctor with information about the size and shape of your heart and how well your heart's chambers and valves are working. This procedure takes approximately one hour. There are no restrictions for this procedure.   ZIO XT- Long Term Monitor Instructions  Your physician has requested you wear a ZIO patch monitor for 14 days.  This is a single patch monitor. [employer] supplies one patch monitor per enrollment. Additional stickers are not available. Please do not apply patch if you will be having a Nuclear Stress Test,  Echocardiogram, Cardiac CT, MRI, or Chest Xray during the period you would be wearing the  monitor. The patch cannot be worn during these tests. You cannot remove and re-apply the  ZIO XT patch monitor.  Your ZIO patch monitor will be mailed 3 day USPS to your address on file. It may take 3-5 days  to receive your monitor after you have been enrolled.  Once you have received your monitor, please review the enclosed instructions. Your monitor  has already been registered assigning a specific monitor serial # to you.  Billing and Patient Assistance Program Information  We have supplied [employer]  with any of your insurance information on file for billing purposes. [employer] offers a sliding scale Patient Assistance Program for patients that do not have  insurance, or whose insurance does not completely cover the cost of the ZIO monitor.  You must apply for the Patient Assistance Program to qualify for this discounted rate.  To apply, please call [employer] at [phone], select option 4, select option 2, ask to apply for  Patient Assistance Program. Theodore Demark will ask your household income, and how many people  are in your household. They will quote your out-of-pocket cost based on that information.  [employer] will also be able to set up a 32-month, interest-free payment plan if needed.  Applying the monitor   Shave hair from upper left chest.  Hold abrader disc by orange tab. Rub abrader in 40 strokes over the upper left chest as  indicated in your monitor instructions.  Clean area with 4 enclosed alcohol pads. Let dry.  Apply patch as indicated in monitor instructions. Patch will be placed under collarbone on left  side of chest with arrow pointing upward.  Rub patch adhesive wings for 2 minutes. Remove white label marked "1". Remove the white  label marked "2". Rub patch adhesive wings for 2 additional minutes.  While looking in a mirror, press and release button in center of patch. A small green light will  flash 3-4 times. This will be your only indicator that the monitor has been turned on.  Do not shower for the first 24 hours. You may shower after  the first 24 hours.  Press the button if you feel a symptom. You will hear a small click. Record Date, Time and  Symptom in the Patient Logbook.  When you are ready to remove the patch, follow instructions on the last 2 pages of Patient  Logbook. Stick patch monitor onto the last page of Patient Logbook.  Place Patient Logbook in the blue and white box. Use locking tab on box and tape box closed  securely. The blue and white box has  prepaid postage on it. Please place it in the mailbox as  soon as possible. Your physician should have your test results approximately 7 days after the  monitor has been mailed back to [facility].  Call [place] at [phone] if you have questions regarding  your ZIO XT patch monitor. Call them immediately if you see an orange light blinking on your  monitor.  If your monitor falls off in less than 4 days, contact our Monitor department at [phone].  If your monitor becomes loose or falls off after 4 days call [employer] at [phone] for  suggestions on securing your monitor   Follow-Up: At [facility], you and your health needs are our priority.  As part of our continuing mission to provide you with exceptional heart care, we have created designated Provider Care Teams.  These Care Teams include your primary Cardiologist (physician) and Advanced Practice Providers (APPs -  Physician Assistants and Nurse Practitioners) who all work together to provide you with the care you need, when you need it.  We recommend signing up for the patient portal called "MyChart".  Sign up information is provided on this After Visit Summary.  MyChart is used to connect with patients for Virtual Visits (Telemedicine).  Patients are able to view lab/test results, encounter notes, upcoming appointments, etc.  Non-urgent messages can be sent to your provider as well.   To learn more about what you can do with MyChart, go to [email or web address].    Your next appointment:   4 month(s)  The format for your next appointment:   In Person  Provider:   DR. Lenna Sciara    Other Instructions Echocardiogram An echocardiogram is a test that uses sound waves (ultrasound) to produce images of the heart. Images from an echocardiogram can provide important information about: Heart size and shape. The size and thickness and movement of your heart's walls. Heart muscle function and  strength. Heart valve function or if you have stenosis. Stenosis is when the heart valves are too narrow. If blood is flowing backward through the heart valves (regurgitation). A tumor or infectious growth around the heart valves. Areas of heart muscle that are not working well because of poor blood flow or injury from a heart attack. Aneurysm detection. An aneurysm is a weak or damaged part of an artery wall. The wall bulges out from the normal force of blood pumping through the body. Tell a health care provider about: Any allergies you have. All medicines you are taking, including vitamins, herbs, eye drops, creams, and over-the-counter medicines. Any blood disorders you have. Any surgeries you have had. Any medical conditions you have. Whether you are pregnant or may be pregnant. What are the risks? Generally, this is a safe test. However, problems may occur, including an allergic reaction to dye (contrast) that may be used during the test. What happens before the test? No specific preparation is needed. You may eat and drink normally. What happens during the test?  You will take off your clothes from the waist up and put on a [facility] gown. Electrodes or electrocardiogram (ECG)patches may be placed on your chest. The electrodes or patches are then connected to a device that monitors your heart rate and rhythm. You will lie down on a table for an ultrasound exam. A gel will be applied to your chest to help sound waves pass through your skin. A handheld device, called a transducer, will be pressed against your chest and moved over your heart. The transducer produces sound waves that travel to your heart and bounce back (or "echo" back) to the transducer. These sound waves will be captured in real-time and changed into images of your heart that can be viewed on a video monitor. The images will be recorded on a computer and reviewed by your health care provider. You may be asked to change  positions or hold your breath for a short time. This makes it easier to get different views or better views of your heart. In some cases, you may receive contrast through an IV in one of your veins. This can improve the quality of the pictures from your heart. The procedure may vary among health care providers and hospitals. What can I expect after the test? You may return to your normal, everyday life, including diet, activities, and medicines, unless your health care provider tells you not to do that. Follow these instructions at home: It is up to you to get the results of your test. Ask your health care provider, or the department that is doing the test, when your results will be ready. Keep all follow-up visits. This is important. Summary An echocardiogram is a test that uses sound waves (ultrasound) to produce images of the heart. Images from an echocardiogram can provide important information about the size and shape of your heart, heart muscle function, heart valve function, and other possible heart problems. You do not need to do anything to prepare before this test. You may eat and drink normally. After the echocardiogram is completed, you may return to your normal, everyday life, unless your health care provider tells you not to do that. This information is not intended to replace advice given to you by your health care provider. Make sure you discuss any questions you have with your health care provider. Document Revised: 11/05/2020 Document Reviewed: 10/16/2019 Elsevier Patient Education  2022 [employer].

## 2021-01-21 NOTE — Progress Notes (Unsigned)
Patient enrolled for [employer] to mail a 14 day ZIO XT monitor to her home. 

## 2021-01-24 LAB — HEPATIC FUNCTION PANEL
ALT: 19 IU/L (ref 0–32)
AST: 24 IU/L (ref 0–40)
Albumin: 4.7 g/dL (ref 3.8–4.9)
Alkaline Phosphatase: 63 IU/L (ref 44–121)
Bilirubin Total: 0.3 mg/dL (ref 0.0–1.2)
Bilirubin, Direct: 0.1 mg/dL (ref 0.00–0.40)
Total Protein: 6.9 g/dL (ref 6.0–8.5)

## 2021-01-24 LAB — TSH+FREE T4
Free T4: 2.13 ng/dL — ABNORMAL HIGH (ref 0.82–1.77)
TSH: 3.07 u[IU]/mL (ref 0.450–4.500)

## 2021-01-24 LAB — LIPID PANEL
Chol/HDL Ratio: 2.6 ratio (ref 0.0–4.4)
Cholesterol, Total: 182 mg/dL (ref 100–199)
HDL: 69 mg/dL (ref >39)
LDL Chol Calc (NIH): 101 mg/dL — ABNORMAL HIGH (ref 0–99)
Triglycerides: 62 mg/dL (ref 0–149)
VLDL Cholesterol Cal: 12 mg/dL (ref 5–40)

## 2021-01-24 LAB — HEMOGLOBIN A1C
Est. average glucose Bld gHb Est-mCnc: 114 mg/dL
Hgb A1c MFr Bld: 5.6 % (ref 4.8–5.6)

## 2021-01-24 LAB — BASIC METABOLIC PANEL
BUN/Creatinine Ratio: 26 (ref 12–28)
BUN: 19 mg/dL (ref 8–27)
CO2: 24 mmol/L (ref 20–29)
Calcium: 9.3 mg/dL (ref 8.7–10.3)
Chloride: 96 mmol/L (ref 96–106)
Creatinine, Ser: 0.74 mg/dL (ref 0.57–1.00)
Glucose: 97 mg/dL (ref 70–99)
Potassium: 3.9 mmol/L (ref 3.5–5.2)
Sodium: 134 mmol/L (ref 134–144)
eGFR: 93 mL/min/{1.73_m2} (ref >59)

## 2021-01-24 LAB — MAGNESIUM: Magnesium: 2.4 mg/dL — ABNORMAL HIGH (ref 1.6–2.3)

## 2021-01-25 ENCOUNTER — Encounter: Payer: Self-pay | Admitting: Family Medicine

## 2021-01-26 NOTE — Telephone Encounter (Signed)
Hi Miho I reviewed over your blood pressure readings.  A few of them are elevated but other ones are in good range.  Please continue to strive towards eating healthy as well as regular physical activity.  It would be fine to check your blood pressure periodically such as 2 or 3 times per week then it would be a good idea to follow-up with Hoyle Sauer in approximately 30 days.  If blood pressure readings can stay in a good zone no medications are indicated.  But if blood pressure readings are trending high then you would need to be on medications.  Certainly we will wait to see what your cardiac testing shows.  [facility]ala

## 2021-01-27 DIAGNOSIS — R002 Palpitations: Secondary | ICD-10-CM

## 2021-02-16 ENCOUNTER — Telehealth: Payer: Self-pay | Admitting: *Deleted

## 2021-02-16 MED ORDER — METOPROLOL SUCCINATE ER 25 MG PO TB24: 25.0000 mg | ORAL_TABLET | Freq: Every day | ORAL | 3 refills | Status: DC

## 2021-02-16 NOTE — Telephone Encounter (Signed)
-----   Message from Early Osmond, MD sent at 02/16/2021  9:20 AM EST ----- I spoke with patient this morning, to get echo tomorrow.  Please start Toprol XL 25mg  Qbedtime.  Thanks.

## 2021-02-16 NOTE — Telephone Encounter (Signed)
Toprol XL 25 mg prescription sent to pharmacy.

## 2021-02-17 ENCOUNTER — Ambulatory Visit (HOSPITAL_COMMUNITY): Payer: 59 | Attending: Internal Medicine

## 2021-02-17 ENCOUNTER — Other Ambulatory Visit: Payer: Self-pay

## 2021-02-17 DIAGNOSIS — I10 Essential (primary) hypertension: Secondary | ICD-10-CM

## 2021-02-17 DIAGNOSIS — R002 Palpitations: Secondary | ICD-10-CM | POA: Insufficient documentation

## 2021-02-17 LAB — ECHOCARDIOGRAM COMPLETE
Area-P 1/2: 3.66 cm2
S' Lateral: 3 cm

## 2021-02-18 ENCOUNTER — Other Ambulatory Visit: Payer: Self-pay | Admitting: Nurse Practitioner

## 2021-02-20 ENCOUNTER — Telehealth: Payer: Self-pay | Admitting: Internal Medicine

## 2021-02-20 MED ORDER — AMLODIPINE BESYLATE 5 MG PO TABS: 5.0000 mg | ORAL_TABLET | Freq: Every day | ORAL | 11 refills | Status: DC

## 2021-02-20 NOTE — Telephone Encounter (Signed)
Message from Dr. Ali Lowe:  I spoke with patient, please d/c Toprol and start amlodipine 5mg  qday.  Thanks.

## 2021-02-20 NOTE — Telephone Encounter (Signed)
Pt is calling to report her BP's and pulses on metoprolol succinate because her husband is a family doctor and was concerned about her low pulse rates.  Before Pt provided her readings she emphasized she is having no symptoms of dizziness, or shortness of breath.  She feels well and is happy with the lower blood pressures.  Wed- 139/85 pulse 48 Thurs- 121/72 pulse 46 Fri- 118/72 pulse 43   Advised would send to Dr. Ali Lowe for further advisement.

## 2021-02-20 NOTE — Telephone Encounter (Signed)
Medication list updated as ordered.

## 2021-02-20 NOTE — Telephone Encounter (Signed)
Attempted to call Pt.  No answer and no VM.

## 2021-02-20 NOTE — Telephone Encounter (Signed)
° °  Pt c/o medication issue:  1. Name of Medication: metoprolol succinate (TOPROL XL) 25 MG 24 hr tablet  2. How are you currently taking this medication (dosage and times per day)? Take 1 tablet (25 mg total) by mouth at bedtime.  3. Are you having a reaction (difficulty breathing--STAT)?   4. What is your medication issue? Pt requesting to speak with Dr. Dara Hoyer nurse regarding her metoprolol, she said it's regarding her BP and pulse changed

## 2021-02-20 NOTE — Telephone Encounter (Signed)
Patient returning call.

## 2021-03-02 ENCOUNTER — Other Ambulatory Visit: Payer: Self-pay | Admitting: Family Medicine

## 2021-03-02 MED ORDER — ONDANSETRON 4 MG PO TBDP: ORAL_TABLET | ORAL | 0 refills | Status: AC

## 2021-03-02 MED ORDER — NIRMATRELVIR/RITONAVIR (PAXLOVID)TABLET: 3.0000 | ORAL_TABLET | Freq: Two times a day (BID) | ORAL | 0 refills | Status: AC

## 2021-03-02 NOTE — Progress Notes (Signed)
Patient [facility] Patient with positive COVID test As on-call provider-spoke with patient patient has risk factors including weight, blood pressure After shared discussion Paxlovid was sent in.  To stop cholesterol medicine for 5 days.  Reduce amlodipine to half dose for 5 days Monitor blood pressure If any problems to notify us GFR normal Refill of Zofran was sent in Patient to follow-up if worse or clinically having health issues

## 2021-03-06 ENCOUNTER — Ambulatory Visit: Payer: 59 | Admitting: Nurse Practitioner

## 2021-03-13 ENCOUNTER — Ambulatory Visit: Payer: 59 | Admitting: Nurse Practitioner

## 2021-03-27 ENCOUNTER — Ambulatory Visit (INDEPENDENT_AMBULATORY_CARE_PROVIDER_SITE_OTHER): Payer: 59 | Admitting: Nurse Practitioner

## 2021-03-27 ENCOUNTER — Encounter: Payer: Self-pay | Admitting: Nurse Practitioner

## 2021-03-27 ENCOUNTER — Other Ambulatory Visit: Payer: Self-pay

## 2021-03-27 VITALS — BP 133/79 | HR 65 | Ht 64.0 in | Wt 177.2 lb

## 2021-03-27 DIAGNOSIS — R7303 Prediabetes: Secondary | ICD-10-CM | POA: Diagnosis not present

## 2021-03-27 DIAGNOSIS — E038 Other specified hypothyroidism: Secondary | ICD-10-CM

## 2021-03-27 DIAGNOSIS — J4520 Mild intermittent asthma, uncomplicated: Secondary | ICD-10-CM

## 2021-03-27 DIAGNOSIS — E785 Hyperlipidemia, unspecified: Secondary | ICD-10-CM | POA: Diagnosis not present

## 2021-03-27 DIAGNOSIS — R922 Inconclusive mammogram: Secondary | ICD-10-CM | POA: Diagnosis not present

## 2021-03-27 DIAGNOSIS — E063 Autoimmune thyroiditis: Secondary | ICD-10-CM | POA: Diagnosis not present

## 2021-03-27 DIAGNOSIS — R9230 Dense breasts, unspecified: Secondary | ICD-10-CM | POA: Insufficient documentation

## 2021-03-27 MED ORDER — LEVOTHYROXINE SODIUM 112 MCG PO TABS: 112.0000 ug | ORAL_TABLET | Freq: Every day | ORAL | 0 refills | Status: DC

## 2021-03-27 NOTE — Progress Notes (Signed)
° °  Subjective:    Patient ID: [facility], female    DOB: Feb 27, 1961, 61 y.o.   MRN: [id number]  Presents for follow-up on her chronic health issues.  Patient experienced severe hypokalemia after going through prep for colonoscopy which needs to be rescheduled.  Was having some heart issues regarding rhythm and blood pressure.  Has been followed by cardiology including home monitoring.  Her BP record in the office today shows overall much improved BP scoring outside of the office.  For activity she is walking some but not as much as she used to.  Also states she has not doing any aerobic activity.  Eating very healthy. Patient has had a mammogram on 08/25/2020.  Normal with breast density class C.  [facility] her mother had breast cancer in her early 79s.  Patient needs refill on thyroid meds GAD 7 : Generalized Anxiety Score 03/27/2021  Nervous, Anxious, on Edge 0  Control/stop worrying 0  Worry too much - different things 0  Trouble relaxing 0  Restless 0  Easily annoyed or irritable 0  Afraid - awful might happen 0  Total GAD 7 Score 0    Review of Systems  Constitutional:  Negative for activity change and appetite change.  HENT:  Negative for sore throat and trouble swallowing. Postnasal drip: I.  Respiratory:  Negative for cough, chest tightness and shortness of breath.   Cardiovascular:  Negative for chest pain, palpitations and leg swelling.  Neurological:  Negative for syncope.  Psychiatric/Behavioral:  The patient is not nervous/anxious.       Objective:   Physical Exam NAD.  Alert, oriented.  Speech clear.  Making good eye contact.  Dressed appropriately.  Thoughts logical coherent and relevant.  Cheerful affect.  Thyroid nontender to palpation, no mass or goiter noted.  Lungs clear.  Heart regular rate and rhythm.  No murmur or gallop noted.  Lower extremities no edema.  Today's Vitals   03/27/21 0903 03/27/21 0927  BP: (!) 159/99 133/79  Pulse:  65  SpO2: 98%   Weight: 177 lb  3.2 oz (80.4 kg)   Height: 5\' 4"  (1.626 m)    Body mass index is 30.42 kg/m. See recent labs 01/23/2021.  Tyrer-Cuzick lifetime breast cancer risk 20.5%.     Assessment & Plan:   Problem List Items Addressed This Visit       Respiratory   Mild intermittent asthma without complication     Endocrine   Hypothyroidism - Primary     Other   Breast density   Hyperlipidemia   Prediabetes   Meds ordered this encounter  Medications   levothyroxine (SYNTHROID) 112 MCG tablet    Sig: Take 1 tablet (112 mcg total) by mouth daily.    Dispense:  90 tablet    Refill:  0    Order Specific Question:   Supervising Provider    Answer:   Sallee Lange A [[id number]]   Continue healthy diet. Encouraged regular activity setting specific goals such as steps per day or minutes per day. Recommend follow-up with cardiology as planned for recheck of her blood pressure. Discussed breast cancer risk based on screening.  Patient will review her options as discussed during visit and let us know if she would like to proceed.  States she has a high deductible on her insurance at this time. Return for physical in July.  Also routine labs at that time. Call back sooner if needed.

## 2021-05-08 NOTE — Progress Notes (Signed)
Cardiology Office Note:    Date:  05/11/2021   ID:  A[facility], DOB 11-17-1960, MRN [id number]  PCP:  Kathyrn Drown, MD   [facility] Providers Cardiologist:  Lenna Sciara, MD Referring MD: Kathyrn Drown, MD   Chief Complaint/Reason for Referral:  F/U palpitations  ASSESSMENT:    Palpitations  Hyperlipidemia, unspecified hyperlipidemia type  Hypertension, unspecified type  NSVT (nonsustained ventricular tachycardia)  PLAN:    In order of problems listed above: 1.  Had episodes of supraventricular tachycardia and one run of VT (short duration).  She has a normal ejection fraction and no history of antecedent chest pain.  I reviewed her monitor with Dr. Caryl Comes of EP.  We will obtain cardiac MRI to evaluate further.  Will start Toprol XL 12.5mg  QPM and stop amlodipine.  F/U 3 months. 2.  Lipid panel recently was reasonable with an LDL of 100. 3.  Blood pressure on her home readings is well controlled however here it is 062 systolic.  We will adjust her medications as above due to her monitor readings. 4.  We will obtain cardiac MRI to evaluate further.  If there is scar I will refer her to EP for further evaluation.           Dispo:  No follow-ups on file.     Medication Adjustments/Labs and Tests Ordered: Current medicines are reviewed at length with the patient today.  Concerns regarding medicines are outlined above.   Tests Ordered: No orders of the defined types were placed in this encounter.   Medication Changes: No orders of the defined types were placed in this encounter.   History of Present Illness:    FOCUSED PROBLEM LIST:   1.  Palpitations with monitor December 2022 demonstrating predominantly supraventricular tachycardia 2.  Hypothyroidism 3.  Hyperlipidemia 4.  Hypertension  The patient is a 61 y.o. female with the indicated medical history here for follow-up.  I referred her for a monitor which showed SVT and potentially 1 episode of VT.   She was started on Toprol-XL 25 mg but her heart rate went down to the 40s to 50s so she was changed to Norvasc.  In the interim since I saw her she contracted COVID and was treated with a Paxlovid and her norvasc was decreased to 5mg .  Her blood pressures at home are well controlled.  She denies any chest pain, palpitations, paroxysmal nocturnal dyspnea, lightheadedness, or syncope.  She has required no emergency room visits or hospitalizations.  She generally feels well and is without significant complaints today.        Previous Medical History: Past Medical History:  Diagnosis Date   Anemia    related to heavy vaginal bleeding-has resolved   Hypothyroidism      Current Medications: Current Meds  Medication Sig   albuterol (VENTOLIN HFA) 108 (90 Base) MCG/ACT inhaler INHALE 2 PUFFS INTO THE LUNGS EVERY 4 HOURS AS NEEDED FOR WHEEZING OR SHORTNESS OF BREATH. (Patient taking differently: Inhale 2 puffs into the lungs every 4 (four) hours as needed for wheezing or shortness of breath. INHALE 2 PUFFS INTO THE LUNGS EVERY 4 HOURS AS NEEDED FOR WHEEZING OR SHORTNESS OF BREATH.)   amLODipine (NORVASC) 5 MG tablet Take 1 tablet (5 mg total) by mouth daily.   cholecalciferol (VITAMIN D) 1000 UNITS tablet Take 1,000 Units by mouth daily.     fluticasone (FLOVENT HFA) 44 MCG/ACT inhaler Inhale 2 puffs into the lungs 2 (two) times daily.  ketoconazole (NIZORAL) 2 % cream Apply 1 application topically 2 (two) times daily. (Patient taking differently: Apply 1 application. topically daily as needed for irritation.)   levothyroxine (SYNTHROID) 112 MCG tablet Take 1 tablet (112 mcg total) by mouth daily.   loratadine (CLARITIN) 10 MG tablet Take 10 mg by mouth daily.   Multiple Vitamin (MULTIVITAMIN) tablet Take 1 tablet by mouth daily. WITH IRON AND VITAMIN D   ondansetron (ZOFRAN ODT) 4 MG disintegrating tablet Take one tablet by mouth every 6-8 hours as needed for nausea   rosuvastatin (CRESTOR) 10 MG  tablet Take 1 tablet by mouth once daily   triamcinolone (NASACORT) 55 MCG/ACT AERO nasal inhaler Place 2 sprays into the nose daily.   valACYclovir (VALTREX) 1000 MG tablet Take 1 tablet by mouth three times a day (Patient taking differently: Take 1,000 mg by mouth daily as needed (fever blisters). Takes as needed for fever blisters.)   vitamin C (ASCORBIC ACID) 500 MG tablet Take 500 mg by mouth daily.     Allergies:    Betadine [povidone iodine] and Latex   Social History:   Social History   Tobacco Use   Smoking status: Never   Smokeless tobacco: Never  Vaping Use   Vaping Use: Never used  Substance Use Topics   Alcohol use: Yes    Alcohol/week: 7.0 standard drinks    Types: 7 Glasses of wine per week   Drug use: No     Family Hx: Family History  Problem Relation Age of Onset   Hyperlipidemia Mother    Breast cancer Mother 26   Hyperlipidemia Father    Hypertension Father    Kidney disease Father    Colon cancer Maternal Aunt      Review of Systems:   Please see the history of present illness.    All other systems reviewed and are negative.     EKGs/Labs/Other Test Reviewed:    EKG:  SR  Prior CV studies:  Monitor 12/22  3 Ventricular Tachycardia runs occurred, the run with the fastest interval lasting 3 beats with a max rate of 176 bpm, the longest  lasting 15 beats with an avg rate of 167 bpm.    236 Supraventricular Tachycardia runs occurred, the run with the fastest interval lasting 9.1 secs with a max rate of 218 bpm, the longest lasting 19.0 secs with an avg rate of 120 bpm. Some episodes of    Supraventricular Tachycardia conducted with possible aberrancy. Idioventricular Rhythm was present. Junctional Rhythm was present. Isolated SVEs were occasional (3.2%, [id number]), SVE Couplets were rare (<1.0%, 2254), and SVE Triplets were rare (<1.0%, 1013). Isolated VEs were rare (<1.0%, 1355), VE Couplets were rare (<1.0%, 46), and VE Triplets were rare (<1.0%,  15). Ventricular Bigeminy and Trigeminy were present.   No atrial fibrillation, sustained ventricular tachyarrhythmias, or bradyarrhythmias were detected.  TTE 12/22 1. Left ventricular ejection fraction, by estimation, is 60 to 65%. Left  ventricular ejection fraction by 3D volume is 65 %. The left ventricle has  normal function. The left ventricle has no regional wall motion  abnormalities. Left ventricular diastolic   parameters are consistent with Grade I diastolic dysfunction (impaired  relaxation).   2. Right ventricular systolic function is normal. The right ventricular  size is normal. There is normal pulmonary artery systolic pressure. The  estimated right ventricular systolic pressure is 34.1 mmHg.   3. The mitral valve is grossly normal. Trivial mitral valve  regurgitation.   4. The aortic  valve is tricuspid. Aortic valve regurgitation is not  visualized.   5. Aortic dilatation noted. There is borderline dilatation of the aortic  root, measuring 39 mm.   6. The inferior vena cava is normal in size with greater than 50%  respiratory variability, suggesting right atrial pressure of 3 mmHg.   Imaging studies that I have independently reviewed today: Echocardiogram  Recent Labs: 09/09/2020: Hemoglobin 13.8; Platelets 263 01/23/2021: ALT 19; BUN 19; Creatinine, Ser 0.74; Magnesium 2.4; Potassium 3.9; Sodium 134; TSH 3.070   Recent Lipid Panel Lab Results  Component Value Date/Time   CHOL 182 01/23/2021 07:49 AM   TRIG 62 01/23/2021 07:49 AM   HDL 69 01/23/2021 07:49 AM   LDLCALC 101 (H) 01/23/2021 07:49 AM    Risk Assessment/Calculations:          Physical Exam:    VS:  BP (!) 150/70    Pulse 74    Ht 5\' 4"  (1.626 m)    Wt 179 lb (81.2 kg)    LMP 01/05/2011    SpO2 97%    BMI 30.73 kg/m    Wt Readings from Last 3 Encounters:  05/11/21 179 lb (81.2 kg)  03/27/21 177 lb 3.2 oz (80.4 kg)  01/21/21 177 lb (80.3 kg)    GENERAL:  No apparent distress, AOx3 HEENT:   No carotid bruits, +2 carotid impulses, no scleral icterus CAR: RRR no murmurs, gallops, rubs, or thrills RES:  Clear to auscultation bilaterally ABD:  Soft, nontender, nondistended, positive bowel sounds x 4 VASC:  +2 radial pulses, +2 carotid pulses, palpable pedal pulses NEURO:  CN 2-12 grossly intact; motor and sensory grossly intact PSYCH:  No active depression or anxiety EXT:  No edema, ecchymosis, or cyanosis  Signed, Early Osmond, MD  05/11/2021 10:28 AM    [address] [place], [place],   [address] Phone: [phone]; Fax: [phone]   Note:  This document was prepared using Dragon voice recognition software and may include unintentional dictation errors.

## 2021-05-11 ENCOUNTER — Ambulatory Visit: Payer: 59 | Admitting: Internal Medicine

## 2021-05-11 ENCOUNTER — Encounter: Payer: Self-pay | Admitting: Internal Medicine

## 2021-05-11 ENCOUNTER — Other Ambulatory Visit: Payer: Self-pay

## 2021-05-11 VITALS — BP 150/70 | HR 74 | Ht 64.0 in | Wt 179.0 lb

## 2021-05-11 DIAGNOSIS — I4729 Other ventricular tachycardia: Secondary | ICD-10-CM | POA: Diagnosis not present

## 2021-05-11 DIAGNOSIS — I10 Essential (primary) hypertension: Secondary | ICD-10-CM

## 2021-05-11 DIAGNOSIS — E785 Hyperlipidemia, unspecified: Secondary | ICD-10-CM | POA: Diagnosis not present

## 2021-05-11 DIAGNOSIS — R002 Palpitations: Secondary | ICD-10-CM | POA: Diagnosis not present

## 2021-05-11 LAB — HEMOGLOBIN AND HEMATOCRIT, BLOOD
Hematocrit: 40.6 % (ref 34.0–46.6)
Hemoglobin: 14.1 g/dL (ref 11.1–15.9)

## 2021-05-11 MED ORDER — METOPROLOL SUCCINATE ER 25 MG PO TB24: 12.5000 mg | ORAL_TABLET | Freq: Every day | ORAL | 3 refills | Status: DC

## 2021-05-11 NOTE — Patient Instructions (Signed)
Medication Instructions:  ?Your physician has recommended you make the following change in your medication:  ?1.) stop amlodipine ?2.) begin metoprolol succinate (Toprol XL) 25 mg - TAKE HALF TABLET DAILY AT BEDTIME ? ?*If you need a refill on your cardiac medications before your next appointment, please call your pharmacy* ? ? ?Lab Work: ?Today: H&H ? ? ?Testing/Procedures: ?Your physician has requested that you have a cardiac MRI. Cardiac MRI uses a computer to create images of your heart as its beating, producing both still and moving pictures of your heart and major blood vessels. For further information please visit [email or web address] Please follow the instruction sheet given to you today for more information. ? ? ?Follow-Up: ?At [facility], you and your health needs are our priority.  As part of our continuing mission to provide you with exceptional heart care, we have created designated Provider Care Teams.  These Care Teams include your primary Cardiologist (physician) and Advanced Practice Providers (APPs -  Physician Assistants and Nurse Practitioners) who all work together to provide you with the care you need, when you need it. ? ? ?Your next appointment:   ?3 month(s) ? ?The format for your next appointment:   ?In Person ? ?Provider:   ?None   ? ?  ?

## 2021-05-21 ENCOUNTER — Telehealth: Payer: Self-pay | Admitting: Internal Medicine

## 2021-05-21 NOTE — Telephone Encounter (Signed)
Spoke with the patient.  She has decided that she would prefer a medication for anxiety for her cardiac MRI.  She has never taken anti-anxiety medication.  Adv would review with Dr. Ali Lowe and she will be called back when this is addressed.  Okay to use the [employer], [place]. ?

## 2021-05-21 NOTE — Telephone Encounter (Signed)
New Message: ? ? ? ? ?Patient said she would like to talk to Dr Dara Hoyer nurse. She said it is about getting some medicine to take before her MR Cardo Morphology Test. ?

## 2021-05-22 MED ORDER — LORAZEPAM 0.5 MG PO TABS: 0.5000 mg | ORAL_TABLET | Freq: Once | ORAL | 0 refills | Status: AC

## 2021-05-22 NOTE — Telephone Encounter (Signed)
Message from Dr. Ali Lowe: ?Ativan 0.'5mg'$  thanks ? ?Called to [employer] [facility] for Lorazepam 0.'5mg'$  - take 30-60 min before MRI per Dr. Ali Lowe.   I adv the patient that since she has never taken anxiolytic to take 1/2 of the tablet 60 min prior to see how she tolerates, and then if needed take the other half closer to appointment if she needs to.  Aware she will need a driver.  Pt appreciative for assistance. ?

## 2021-05-28 ENCOUNTER — Other Ambulatory Visit: Payer: Self-pay | Admitting: Family Medicine

## 2021-05-28 ENCOUNTER — Other Ambulatory Visit: Payer: Self-pay | Admitting: Nurse Practitioner

## 2021-05-29 ENCOUNTER — Telehealth: Payer: Self-pay

## 2021-05-29 MED ORDER — ROSUVASTATIN CALCIUM 10 MG PO TABS: 10.0000 mg | ORAL_TABLET | Freq: Every day | ORAL | 1 refills | Status: DC

## 2021-05-29 MED ORDER — LEVOTHYROXINE SODIUM 112 MCG PO TABS: 112.0000 ug | ORAL_TABLET | Freq: Every day | ORAL | 1 refills | Status: DC

## 2021-05-29 NOTE — Telephone Encounter (Signed)
Refills were sent into pharmacy per [place] NP. Patient notified. ?

## 2021-05-29 NOTE — Telephone Encounter (Signed)
Pt called in while she was at pharmacy trying to pick up meds levothyroxine (SYNTHROID) 112 MCG tablet  ?and rosuvastatin (CRESTOR) 10 MG tablet  ?Pt states that her meds have been denied. Pt states she doesn't understand why her meds have been denied for refills. Please advise. ? ?Cb#: [phone] ?

## 2021-06-01 ENCOUNTER — Telehealth: Payer: Self-pay | Admitting: Family Medicine

## 2021-06-01 ENCOUNTER — Encounter: Payer: Self-pay | Admitting: Nurse Practitioner

## 2021-06-01 ENCOUNTER — Other Ambulatory Visit: Payer: Self-pay | Admitting: Nurse Practitioner

## 2021-06-01 MED ORDER — QVAR REDIHALER 40 MCG/ACT IN AERB: 2.0000 | INHALATION_SPRAY | Freq: Two times a day (BID) | RESPIRATORY_TRACT | 6 refills | Status: DC

## 2021-06-01 NOTE — Telephone Encounter (Signed)
PA attempted for Flovent HFA 31mg. PA denied. Formulary alternatives are Arnuity Ellipta, Qvar Redihaler, Denial letter in provider office. Please advise. Thank you ?

## 2021-06-12 ENCOUNTER — Telehealth (HOSPITAL_COMMUNITY): Payer: Self-pay | Admitting: *Deleted

## 2021-06-12 NOTE — Telephone Encounter (Signed)
Reaching out to patient to offer assistance regarding upcoming cardiac imaging study; pt verbalizes understanding of appt date/time, parking situation and where to check in, medications ordered, and verified current allergies; name and call back number provided for further questions should they arise ? ?Gordy Clement RN Navigator Cardiac Imaging ?[facility] ?[phone] office ?[phone] cell ? ?Patient has a 0.'5mg'$  ativan for the test.  She denies metal or difficult IV start. ?

## 2021-06-15 ENCOUNTER — Ambulatory Visit (HOSPITAL_COMMUNITY)
Admission: RE | Admit: 2021-06-15 | Discharge: 2021-06-15 | Disposition: A | Discharge status: Home / Self Care | Payer: 59 | Source: Ambulatory Visit | Attending: Internal Medicine | Admitting: Internal Medicine

## 2021-06-15 DIAGNOSIS — E785 Hyperlipidemia, unspecified: Secondary | ICD-10-CM

## 2021-06-15 DIAGNOSIS — I10 Essential (primary) hypertension: Secondary | ICD-10-CM

## 2021-06-15 DIAGNOSIS — R002 Palpitations: Secondary | ICD-10-CM

## 2021-06-15 DIAGNOSIS — I4729 Other ventricular tachycardia: Secondary | ICD-10-CM

## 2021-06-15 IMAGING — MR MR CARD MORPHOLOGY WO/W CM
45 of 48 series · 45 of 48 positions shown · IV contrast (Gadavist)
Comparison: none

CLINICAL DATA: Supraventricular tachycardia (SVT), Nonsustained
ventricular tachycardia

EXAM:
CARDIAC MRI
TECHNIQUE: The patient was scanned on a 1.5 Tesla GE magnet. A dedicated
cardiac coil was used. Functional imaging was done using Fiesta
sequences. [DATE], and 4 chamber views were done to assess for RWMA's.
Modified MARIF rule using a short axis stack was used to
calculate an ejection fraction on a dedicated work station using
Circle software. The patient received 11mL GADAVIST GADOBUTROL 1
MMOL/ML IV SOLN. After 10 minutes inversion recovery sequences were
used to assess for infiltration and scar tissue.

[Series 4: t2_haste_db_tra_bh · axial · 8.0mm · 1.48mm/px · 1 of 16 slices shown]
[im 1/16]
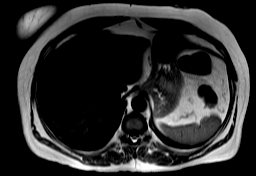

[Series 8: bSSFP · oblique · 8.0mm · 1.70mm/px · 1 of 25 slices shown (1 of 21)]
[im 1/25]
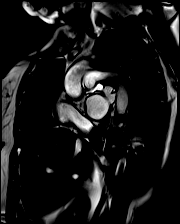

[Series 9: bSSFP · oblique · 8.0mm · 1.70mm/px · 1 of 25 slices shown (2 of 21)]
[im 1/25]
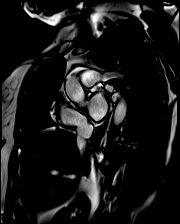

[Series 10: bSSFP · oblique · 8.0mm · 1.70mm/px · 1 of 25 slices shown (3 of 21)]
[im 1/25]
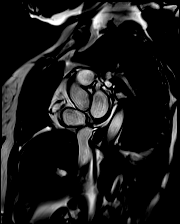

[Series 11: bSSFP · oblique · 8.0mm · 1.70mm/px · 1 of 25 slices shown (4 of 21)]
[im 1/25]
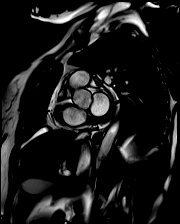

[Series 12: bSSFP · oblique · 8.0mm · 1.70mm/px · 1 of 25 slices shown (5 of 21)]
[im 1/25]
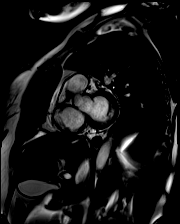

[Series 13: bSSFP · oblique · 8.0mm · 1.70mm/px · 1 of 25 slices shown (6 of 21)]
[im 1/25]
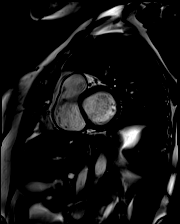

[Series 14: bSSFP · oblique · 8.0mm · 1.70mm/px · 1 of 25 slices shown (7 of 21)]
[im 1/25]
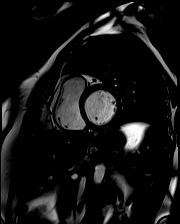

[Series 15: bSSFP · oblique · 8.0mm · 1.70mm/px · 1 of 25 slices shown (8 of 21)]
[im 1/25]
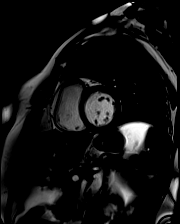

[Series 16: bSSFP · oblique · 8.0mm · 1.70mm/px · 1 of 25 slices shown (9 of 21)]
[im 1/25]
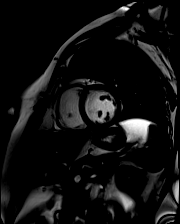

[Series 17: bSSFP · oblique · 8.0mm · 1.70mm/px · 1 of 25 slices shown (10 of 21)]
[im 1/25]
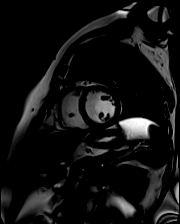

[Series 18: bSSFP · oblique · 8.0mm · 1.70mm/px · 1 of 25 slices shown (11 of 21)]
[im 1/25]
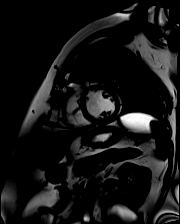

[Series 19: bSSFP · oblique · 8.0mm · 1.70mm/px · 1 of 25 slices shown (12 of 21)]
[im 1/25]
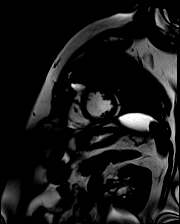

[Series 20: bSSFP · oblique · 8.0mm · 1.70mm/px · 1 of 25 slices shown (13 of 21)]
[im 1/25]
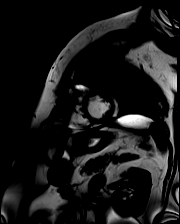

[Series 21: bSSFP · oblique · 8.0mm · 1.70mm/px · 1 of 25 slices shown (14 of 21)]
[im 1/25]
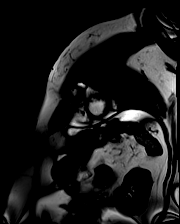

[Series 22: bSSFP · oblique · 8.0mm · 1.70mm/px · 1 of 25 slices shown (15 of 21)]
[im 1/25]
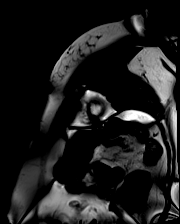

[Series 23: bSSFP · oblique · 8.0mm · 1.70mm/px · 1 of 25 slices shown (16 of 21)]
[im 1/25]
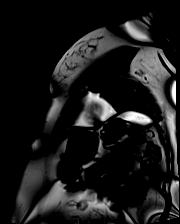

[Series 24: bSSFP · oblique · 8.0mm · 1.70mm/px · 1 of 25 slices shown (17 of 21)]
[im 1/25]
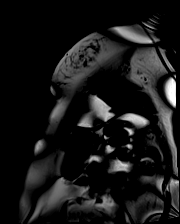

[Series 25: bSSFP · oblique · 8.0mm · 1.70mm/px · 1 of 25 slices shown (18 of 21)]
[im 1/25]
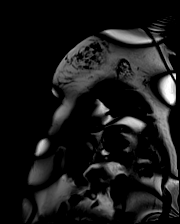

[Series 26: bSSFP · axial · 6.0mm · 1.41mm/px · 1 of 25 slices shown (19 of 21)]
[im 1/25]
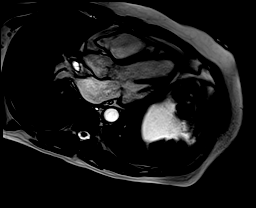

[Series 27: bSSFP · oblique · 6.0mm · 1.48mm/px · 1 of 25 slices shown (20 of 21)]
[im 1/25]
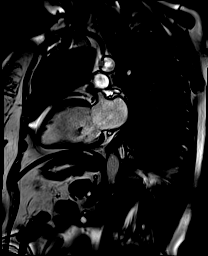

[Series 28: bSSFP · axial · 6.0mm · 1.48mm/px · 1 of 25 slices shown (21 of 21)]
[im 1/25]
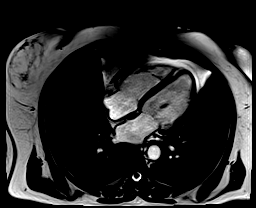

[Series 29: (id)_long_t1 · oblique · 8.0mm · 1.56mm/px · 1 of 24 slices shown]
[im 1/24]
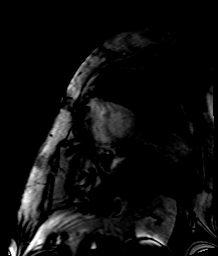

[Series 30: (id)_long_t1_moco · oblique · 8.0mm · 1.56mm/px · 1 of 24 slices shown]
[im 1/24]
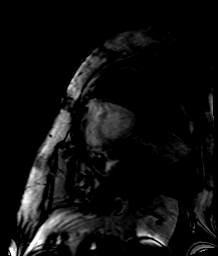

[Series 31: (id)_long_t1_moco_t1 · oblique · 8.0mm · 1.56mm/px · 1 of 3 slices shown (1 of 2)]
[im 1/3]
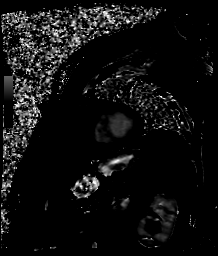

[Series 31: (id)_long_t1_moco_t1 · 1 of 3 slices shown (2 of 2)]
[im 1/3]
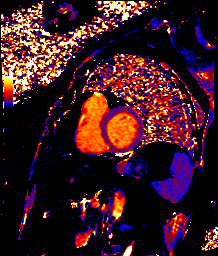

[Series 33: (id)_trufi · oblique · 8.0mm · 2.08mm/px · 1 of 9 slices shown]
[im 1/9]
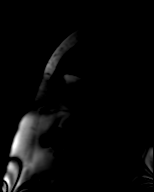

[Series 34: (id)_trufi_moco · oblique · 8.0mm · 2.08mm/px · 1 of 9 slices shown]
[im 1/9]
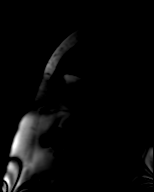

[Series 35: (id)_trufi_moco_t2 · oblique · 8.0mm · 2.08mm/px · 1 of 3 slices shown]
[im 1/3]
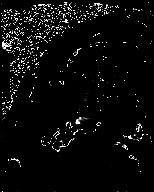

[Series 37: cine_trufi_cs_rt_short axis · oblique · 8.0mm · 1.83mm/px · 1 of 21 slices shown (1 of 16)]
[im 1/21]
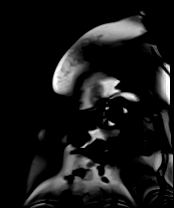

[Series 37: cine_trufi_cs_rt_short axis · oblique · 8.0mm · 1.83mm/px · 1 of 21 slices shown (2 of 16)]
[im 1/21]
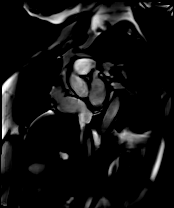

[Series 37: cine_trufi_cs_rt_short axis · oblique · 8.0mm · 1.83mm/px · 1 of 21 slices shown (3 of 16)]
[im 1/21]
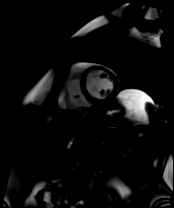

[Series 37: cine_trufi_cs_rt_short axis · oblique · 8.0mm · 1.83mm/px · 1 of 21 slices shown (4 of 16)]
[im 1/21]
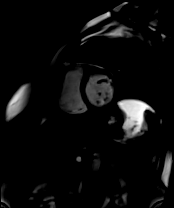

[Series 37: cine_trufi_cs_rt_short axis · oblique · 8.0mm · 1.83mm/px · 1 of 21 slices shown (5 of 16)]
[im 1/21]
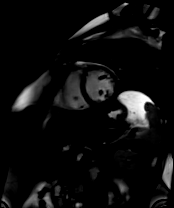

[Series 37: cine_trufi_cs_rt_short axis · oblique · 8.0mm · 1.83mm/px · 1 of 21 slices shown (6 of 16)]
[im 1/21]
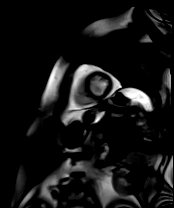

[Series 37: cine_trufi_cs_rt_short axis · oblique · 8.0mm · 1.83mm/px · 1 of 21 slices shown (7 of 16)]
[im 1/21]
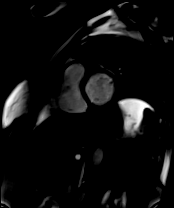

[Series 37: cine_trufi_cs_rt_short axis · oblique · 8.0mm · 1.83mm/px · 1 of 21 slices shown (8 of 16)]
[im 1/21]
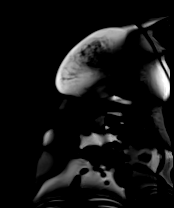

[Series 37: cine_trufi_cs_rt_short axis · oblique · 8.0mm · 1.83mm/px · 1 of 21 slices shown (9 of 16)]
[im 1/21]
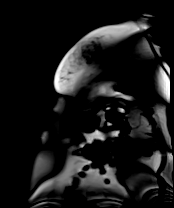

[Series 37: cine_trufi_cs_rt_short axis · oblique · 8.0mm · 1.83mm/px · 1 of 21 slices shown (10 of 16)]
[im 1/21]
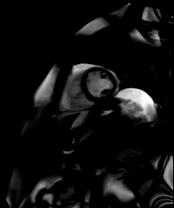

[Series 37: cine_trufi_cs_rt_short axis · oblique · 8.0mm · 1.83mm/px · 1 of 21 slices shown (11 of 16)]
[im 1/21]
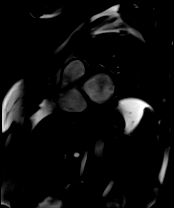

[Series 37: cine_trufi_cs_rt_short axis · oblique · 8.0mm · 1.83mm/px · 1 of 21 slices shown (12 of 16)]
[im 1/21]
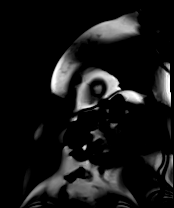

[Series 37: cine_trufi_cs_rt_short axis · oblique · 8.0mm · 1.83mm/px · 1 of 21 slices shown (13 of 16)]
[im 1/21]
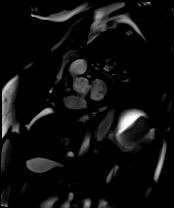

[Series 37: cine_trufi_cs_rt_short axis · oblique · 8.0mm · 1.83mm/px · 1 of 21 slices shown (14 of 16)]
[im 1/21]
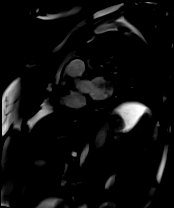

[Series 37: cine_trufi_cs_rt_short axis · oblique · 8.0mm · 1.83mm/px · 1 of 21 slices shown (15 of 16)]
[im 1/21]
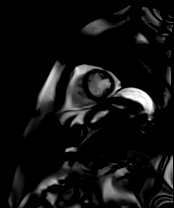

[Series 37: cine_trufi_cs_rt_short axis · oblique · 8.0mm · 1.83mm/px · 1 of 21 slices shown (16 of 16)]
[im 1/21]
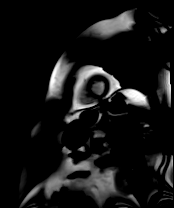

[45 of 48 positions shown; findings below may reference images not displayed]

This examination is tailored for evaluation cardiac anatomy and
function and provides very limited assessment of noncardiac
structures, which are accordingly not evaluated during
interpretation. If there is clinical concern for extracardiac
pathology, further evaluation with CT imaging should be considered.
FINDINGS: LEFT VENTRICLE:

Mildly enlarged left ventricular chamber size by indexed volume.

Normal left ventricular wall thickness.

Normal left ventricular systolic function.

LVEF = 64%

There are no regional wall motion abnormalities.

No myocardial edema, T2 = 53

Normal first pass perfusion.

There is post contrast delayed myocardial enhancement in the
inferior RV insertion point, which may indicate increased pulmonary
pressures.

Normal T1 myocardial nulling kinetics suggest against a diagnosis of
cardiac amyloidosis.

ECV = 29%, nonspecific elevation.

RIGHT VENTRICLE:

Mildly enlarged right ventricular chamber size by indexed volume.

Normal right ventricular wall thickness.

Normal right ventricular systolic function.

RVEF = 60%

There are no regional wall motion abnormalities.

No post contrast delayed myocardial enhancement.

ATRIA:

Normal left atrial size.

Normal right atrial size.

Lipomatous atrial septum.

VALVES:

No significant valvular abnormalities.

PERICARDIUM:

Normal pericardium.  Trivial pericardial effusion.

OTHER: No significant extracardiac findings.

MEASUREMENTS:
Left ventricle:

LV Female

LV EF: 64% (normal 56-78%)

Absolute volumes:

LV EDV: 162mL (Normal 52-141 mL)

LV ESV: 58mL (Normal 13-51 mL)

LV SV: 104mL (Normal 33-97 mL)

CO: 5.5L/min (Normal 2.7-6.0 L/min)

Indexed volumes:

LV EDV: 84mL/sq-m (normal 41-81 mL/sq-m)

LV ESV: 30mL/sq-m (Normal 12-21 mL/sq-m)

LV SV: 54mL/sq-m (Normal 26-56 mL/sq-m)

CI: 2.86L/min/sq-m (Normal 1.8-3.8 L/min/sq-m)

Right ventricle:

RV female

RV EF: 60% (normal 47-80%)

Absolute volumes:

RV EDV: 178 mL (Normal 58-154 mL)

RV ESV: 71 mL (Normal 12-68 mL)

RV SV: 107 mL (Normal 35-98 mL)

CO: 5.6 L/min (Normal 2.7-6 L/min)

Indexed volumes:

RV EDV: 93 ML/sq-m (Normal 48-87 mL/sq-m)

RV ESV: 37 mL/sq-m (Normal 11-28 mL/sq-m)

RV SV: 56 mL/sq-m (Normal 27-57 mL/sq-m)

CI: 2.9 L/min/sq-m (Normal 1.8-3.8 L/min/sq-m)
IMPRESSION: 1. Mild left ventricular enlargement by indexed volume with normal
left ventricular systolic function, LVEF 64%.

2. Mild right ventricular enlargement by indexed volume with normal
right ventricular systolic function, RVEF 60%.

3. There is post contrast delayed myocardial enhancement in the
inferior RV insertion point, which may indicate increased pulmonary
pressures. No other delayed myocardial enhancement seen. No
myocardial edema.

No evidence of scar, inflammation, infarction, or infiltrative
process in the myocardium.

## 2021-06-15 MED ORDER — GADOBUTROL 1 MMOL/ML IV SOLN
11.0000 mL | Freq: Once | INTRAVENOUS | Status: AC | PRN
  Administered 2021-06-15: 11 mL via INTRAVENOUS

## 2021-06-17 ENCOUNTER — Ambulatory Visit (INDEPENDENT_AMBULATORY_CARE_PROVIDER_SITE_OTHER): Payer: 59

## 2021-06-17 ENCOUNTER — Ambulatory Visit: Payer: 59 | Admitting: Orthopedic Surgery

## 2021-06-17 ENCOUNTER — Encounter: Payer: Self-pay | Admitting: Orthopedic Surgery

## 2021-06-17 DIAGNOSIS — M25562 Pain in left knee: Secondary | ICD-10-CM

## 2021-06-17 MED ORDER — LORAZEPAM 0.5 MG PO TABS: ORAL_TABLET | ORAL | 0 refills | Status: DC

## 2021-06-17 NOTE — Progress Notes (Signed)
? ?Office Visit Note ?  ?Patient: Amy Shelton           ?Date of Birth: Sep 19, 1960           ?MRN: [id number] ?Visit Date: 06/17/2021 ?Requested by: Kathyrn Drown, MD ?[address] ?[address] ?[place],  [place] [address] ?PCP: Kathyrn Drown, MD ? ?Subjective: ?Chief Complaint  ?Patient presents with  ? Left Knee - Pain  ? ? ?HPI: Amy Shelton is a 61 year old patient with left knee pain.  Initial onset of pain occurred in 2014 when she was on uneven ground in the garden and sustained a twisting injury.  The knee swelled at that time and was painful.  She hobbled around for several months but eventually did get better.  Radiographs normal at the time.  5 weeks ago she did a lot of walking at the beach which was uneven terrain.  This aggravated her knee again and she developed some swelling and it became very difficult for her to walk 2 to 3 weeks ago but that has also gradually improved.  She has been taking some Tylenol and ibuprofen with some relief.  No prior surgery on the knee.  She does do a lot of walking.  Her son runs a halibut boat in [place].  She and her husband do a lot of walking hiking and outdoor activity.  The pain did wake her from sleep at night initially but not as much lately.  She does report some swelling and occasional popping and most of her pain localizes to the medial aspect of the knee.  She has not had an MRI scan of the knee before. ?             ?ROS: All systems reviewed are negative as they relate to the chief complaint within the history of present illness.  Patient denies  fevers or chills. ? ? ?Assessment & Plan: ?Visit Diagnoses:  ?1. Left knee pain, unspecified chronicity   ? ? ?Plan: Impression is left knee pain with mild effusion and medial joint line tenderness in a patient whose radiographs look pretty reasonable.  I think she may have a degenerative meniscal tear versus occult nonradiographicly  apparent arthritis.  Going to [facility] May 2.  Plan is MRI scan left knee to evaluate  degenerative meniscal tear.  Patient has had symptoms now for 5 to 6 weeks recently and over 10 years chronically.  Swelling is present.  She has excellent quad strength and excellent range of motion.  Has failed a course of conservative treatment and thus MRI scan is indicated to define the pathology to guide treatment.  Follow-up after that study.  Anticipate possible injection prior to her [place] trip depending on the morphology of the meniscal tear or amount of chondral damage present. ? ?Follow-Up Instructions: Return for after MRI.  ? ?Orders:  ?Orders Placed This Encounter  ?Procedures  ? XR KNEE 3 VIEW LEFT  ? MR Knee Left w/o contrast  ? ?Meds ordered this encounter  ?Medications  ? LORazepam (ATIVAN) 0.5 MG tablet  ?  Sig: 1 po 1 hour prior to MRI procedure  ?  Dispense:  2 tablet  ?  Refill:  0  ? ? ? ? Procedures: ?No procedures performed ? ? ?Clinical Data: ?No additional findings. ? ?Objective: ?Vital Signs: LMP 01/05/2011  ? ?Physical Exam:  ? ?Constitutional: Patient appears well-developed ?HEENT:  ?Head: Normocephalic ?Eyes:EOM are normal ?Neck: Normal range of motion ?Cardiovascular: Normal rate ?Pulmonary/chest: Effort normal ?  Neurologic: Patient is alert ?Skin: Skin is warm ?Psychiatric: Patient has normal mood and affect ? ? ?Ortho Exam: Ortho exam demonstrates slightly antalgic gait to the left.  Pedal pulses palpable.  Ankle dorsiflexion intact bilaterally.  Range of motion excellent.  Mild effusion present on the left no effusion on the right.  Mild patellofemoral crepitus bilaterally.  Collateral crucial ligaments are stable.  There is medial greater than lateral joint line tenderness.  No groin pain with internal/external rotation of the left leg. ? ?Specialty Comments:  ?No specialty comments available. ? ?Imaging: ?XR KNEE 3 VIEW LEFT ? ?Result Date: 06/17/2021 ?AP lateral merchant radiographs left knee reviewed.  No significant spurring in the medial or lateral compartments.  There is  some narrowing around the patellofemoral joint but the patella is well centered in the trochlear groove.  No acute fracture.  Alignment intact.  Bone density looks good.  ? ? ?PMFS History: ?Patient Active Problem List  ? Diagnosis Date Noted  ? Breast density 03/27/2021  ? Vitamin D insufficiency 07/27/2019  ? Mild intermittent asthma without complication 81/77/1165  ? Hyperlipidemia 08/18/2018  ? Prediabetes 08/18/2018  ? Impaired fasting glucose 08/18/2018  ? Medial meniscus, posterior horn derangement 12/13/2012  ? OA (osteoarthritis) of knee 12/13/2012  ? Left knee pain 12/13/2012  ? Hypothyroidism 07/07/2012  ? ?Past Medical History:  ?Diagnosis Date  ? Anemia   ? related to heavy vaginal bleeding-has resolved  ? Hypothyroidism   ?  ?Family History  ?Problem Relation Age of Onset  ? Hyperlipidemia Mother   ? Breast cancer Mother 73  ? Hyperlipidemia Father   ? Hypertension Father   ? Kidney disease Father   ? Colon cancer Maternal Aunt   ?  ?Past Surgical History:  ?Procedure Laterality Date  ? BREAST CYST EXCISION Left 1991  ? fibroadenoma removed   ? COLONOSCOPY  02/05/2011  ? Procedure: COLONOSCOPY;  Surgeon: Dorothyann Peng, MD;  Location: [facility];  Service: Endoscopy;  Laterality: N/A;  9:30 AM  ? ?Social History  ? ?Occupational History  ? Not on file  ?Tobacco Use  ? Smoking status: Never  ? Smokeless tobacco: Never  ?Vaping Use  ? Vaping Use: Never used  ?Substance and Sexual Activity  ? Alcohol use: Yes  ?  Alcohol/week: 7.0 standard drinks  ?  Types: 7 Glasses of wine per week  ? Drug use: No  ? Sexual activity: Yes  ?  Birth control/protection: Surgical  ? ? ? ? ? ?

## 2021-06-20 ENCOUNTER — Ambulatory Visit (HOSPITAL_COMMUNITY)
Admission: RE | Admit: 2021-06-20 | Discharge: 2021-06-20 | Disposition: A | Discharge status: Home / Self Care | Payer: 59 | Source: Ambulatory Visit | Attending: Orthopedic Surgery | Admitting: Orthopedic Surgery

## 2021-06-20 DIAGNOSIS — M25562 Pain in left knee: Secondary | ICD-10-CM | POA: Diagnosis not present

## 2021-06-20 IMAGING — MR MR KNEE*L* W/O CM
4 of 6 series · 24 of 40 positions shown · non-contrast
Comparison: Left knee x-rays dated [DATE].

CLINICAL DATA: Left knee pain.

EXAM:
MRI OF THE LEFT KNEE WITHOUT CONTRAST
TECHNIQUE: Multiplanar, multisequence MR imaging of the knee was performed. No
intravenous contrast was administered.

[Series 2: PD fat-sat · sagittal · 3.0mm · 0.29mm/px · 7 of 36 slices shown (1 of 2)]
[im 1/36]
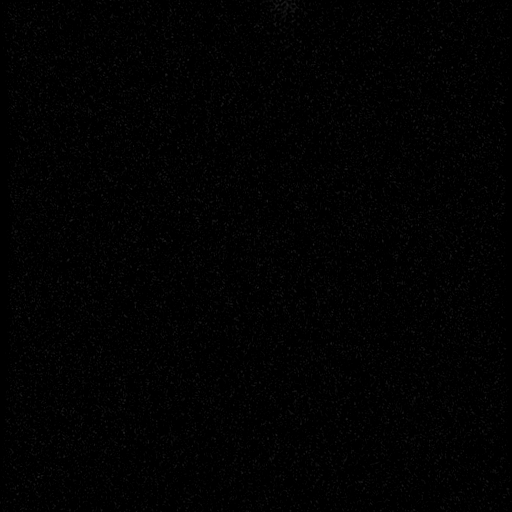
[im 6/36]
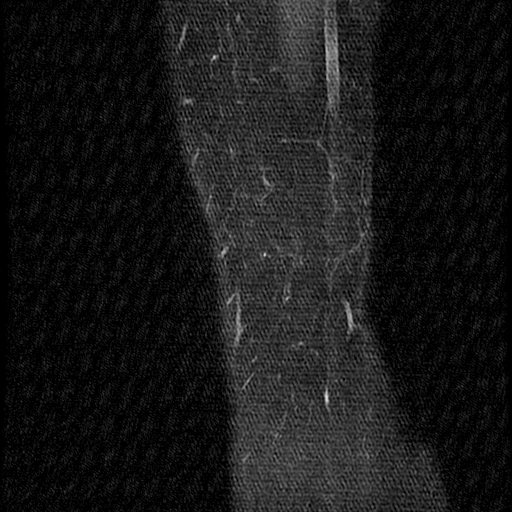
[im 12/36]
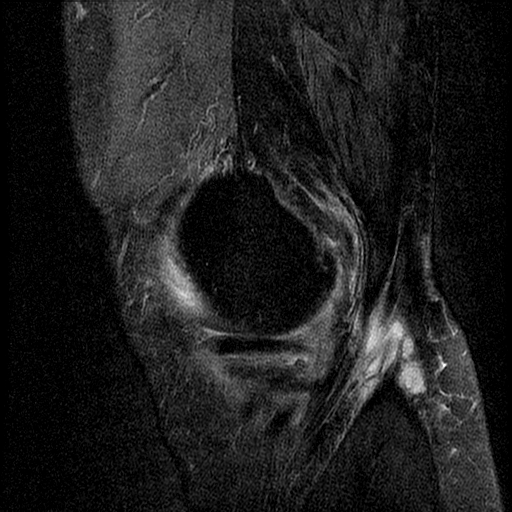
[im 18/36]
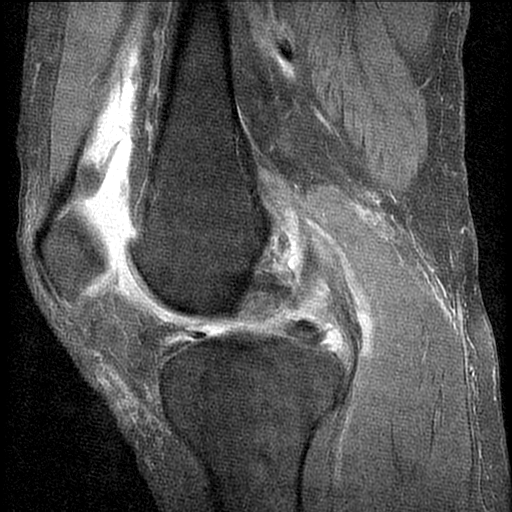
[im 24/36]
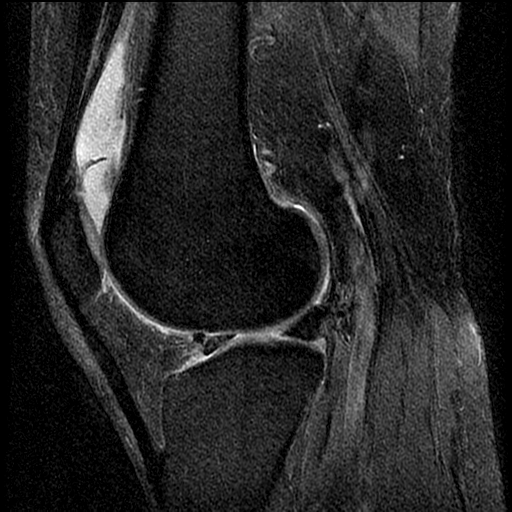
[im 30/36]
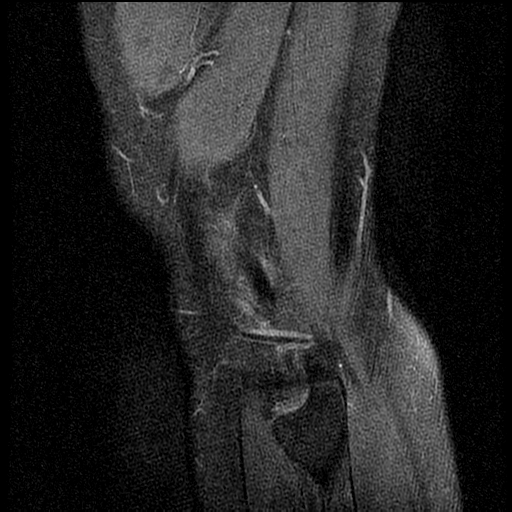
[im 36/36]
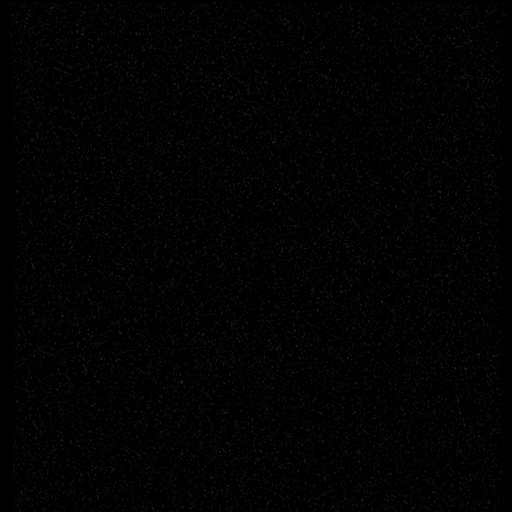

[Series 3: T2 fat-sat · sagittal · 3.0mm · 0.29mm/px · 6 of 36 slices shown (1 of 2)]
[im 1/36]
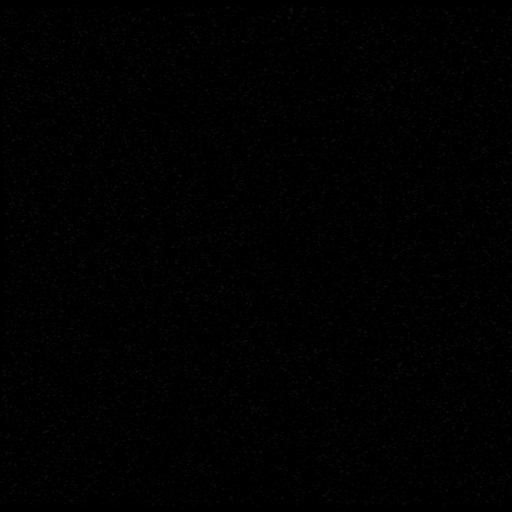
[im 6/36]
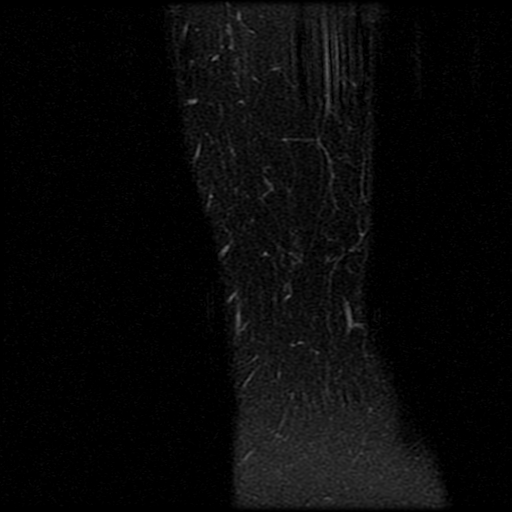
[im 11/36]
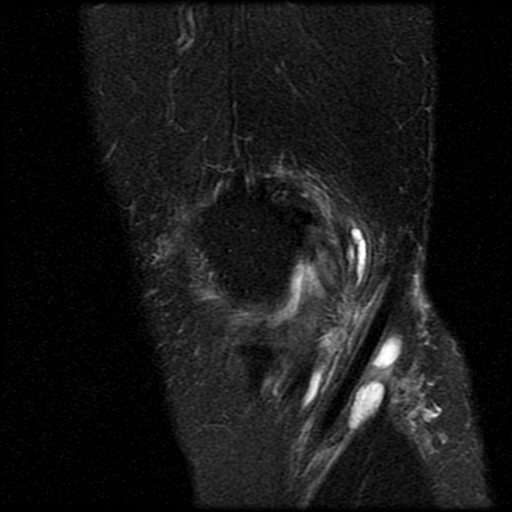
[im 16/36]
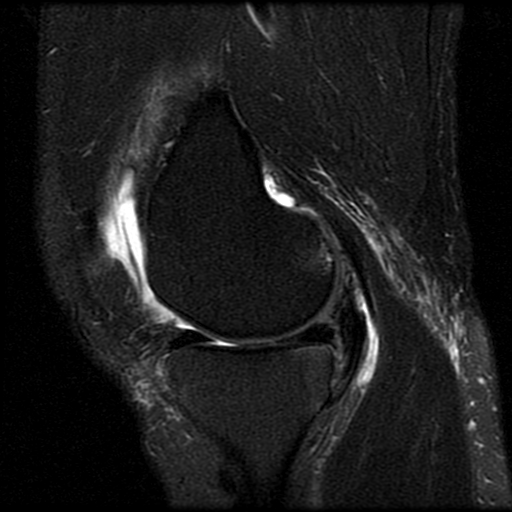
[im 21/36]
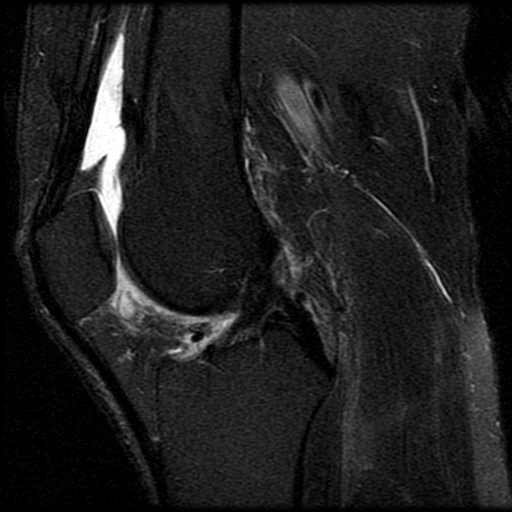
[im 31/36]
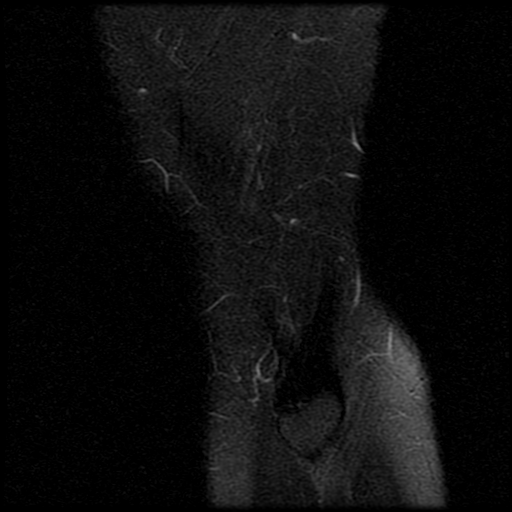

[Series 16: T2 fat-sat · axial · left · 4.0mm · 0.36mm/px · z∈[-94,+31]mm · 3 of 26 slices shown (2 of 2)]
[im 1/26]
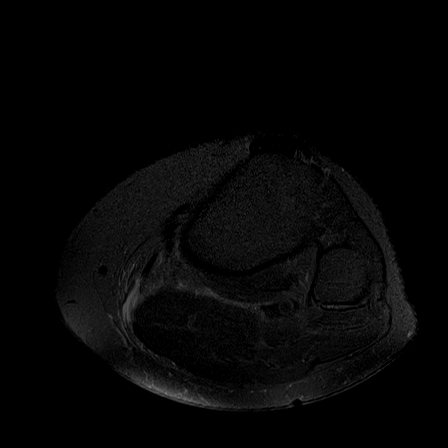
[im 13/26]
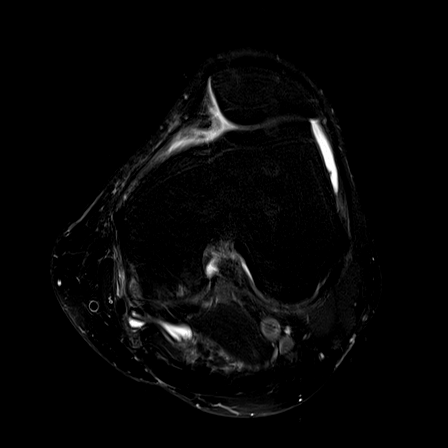
[im 26/26]
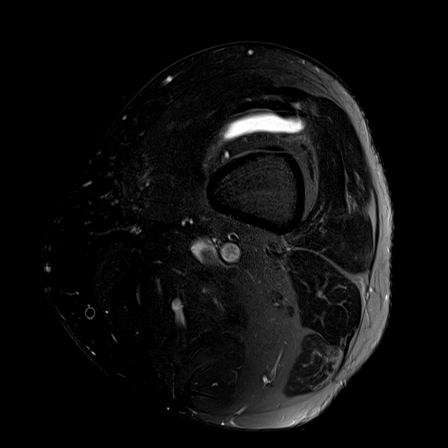

[Series 19: PD fat-sat · coronal · left · 3.0mm · 0.44mm/px · 8 of 36 slices shown (2 of 2)]
[im 1/36]
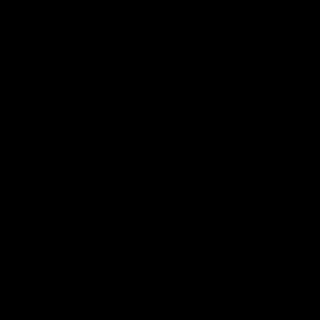
[im 6/36]
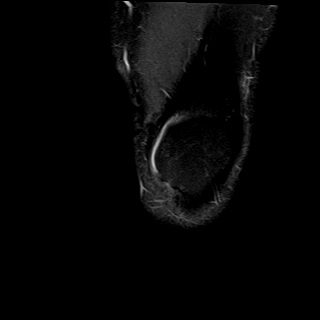
[im 11/36]
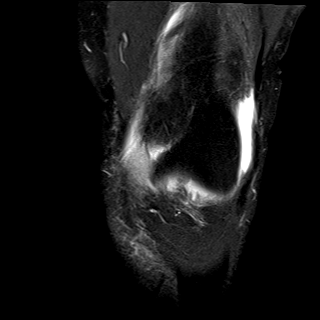
[im 16/36]
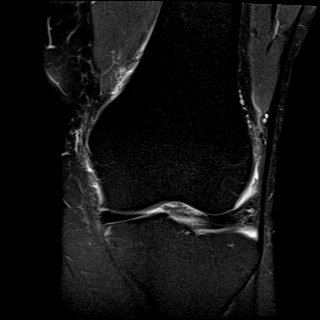
[im 21/36]
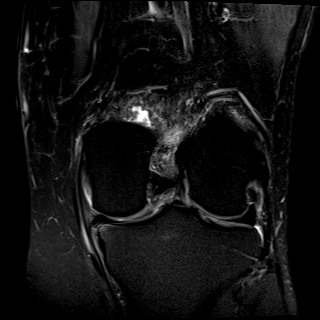
[im 26/36]
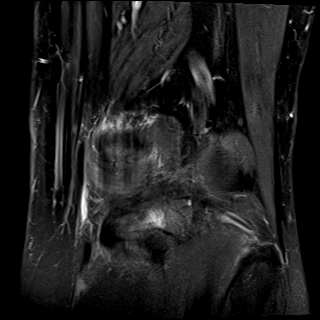
[im 31/36]
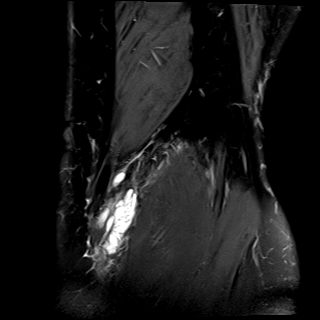
[im 36/36]
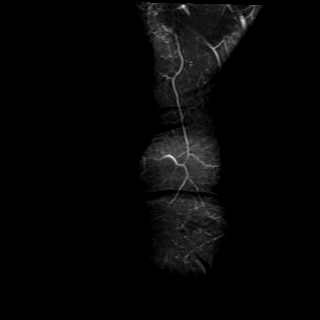

[24 of 40 positions shown; findings below may reference images not displayed]

FINDINGS: MENISCI

Medial meniscus: Degeneration of the posterior body and horn with
small complex tear at the body/posterior horn junction

Lateral meniscus:  Intact.

LIGAMENTS

Cruciates:  Intact ACL and PCL.

Collaterals: Medial collateral ligament is intact. Lateral
collateral ligament complex is intact.

CARTILAGE

Patellofemoral:  Normal.

Medial: Mild partial-thickness cartilage loss over the central
weight-bearing and posterior nonweightbearing medial femoral
condyle.

Lateral: Small focal partial-thickness cartilage defect over the
mesial aspect of the lateral tibial plateau.

Joint:  Small joint effusion.  Normal Hoffa's fat.

Popliteal Fossa: Small multiloculated Baker cyst. Intact popliteus
tendon.

Extensor Mechanism: Intact quadriceps tendon and patellar tendon.
Intact medial and lateral patellar retinaculum. Intact MPFL.

Bones: No focal marrow signal abnormality. No fracture or
dislocation.

Other: None.
IMPRESSION: 1. Degeneration of the medial meniscus posterior body and horn with
small complex tear at the body/posterior horn junction.
2. Mild medial and minimal lateral compartment osteoarthritis.
3. Small joint effusion. Small multiloculated Baker cyst.

## 2021-06-24 NOTE — Progress Notes (Signed)
I called lmom could come in on 5/1 with me for asp and inject or with luke  on 4/25 her choice pls calal thx

## 2021-06-30 ENCOUNTER — Ambulatory Visit (INDEPENDENT_AMBULATORY_CARE_PROVIDER_SITE_OTHER): Payer: 59 | Admitting: Surgical

## 2021-06-30 ENCOUNTER — Encounter: Payer: Self-pay | Admitting: Surgical

## 2021-06-30 DIAGNOSIS — S83207A Unspecified tear of unspecified meniscus, current injury, left knee, initial encounter: Secondary | ICD-10-CM

## 2021-06-30 MED ORDER — LIDOCAINE HCL 1 % IJ SOLN
5.0000 mL | INTRAMUSCULAR | Status: AC | PRN
  Administered 2021-06-30: 5 mL

## 2021-06-30 MED ORDER — BUPIVACAINE HCL 0.25 % IJ SOLN
4.0000 mL | INTRAMUSCULAR | Status: AC | PRN
  Administered 2021-06-30: 4 mL via INTRA_ARTICULAR

## 2021-06-30 MED ORDER — METHYLPREDNISOLONE ACETATE 40 MG/ML IJ SUSP
40.0000 mg | INTRAMUSCULAR | Status: AC | PRN
Start: 2021-06-30 — End: 2021-06-30
  Administered 2021-06-30: 40 mg via INTRA_ARTICULAR

## 2021-06-30 NOTE — Progress Notes (Signed)
? ?Office Visit Note ?  ?Patient: Amy Shelton           ?Date of Birth: [birth year]           ?MRN: [id number] ?Visit Date: 06/30/2021 ?Requested by: Kathyrn Drown, MD ?[address] ?[address] ?[place],  [place] [address] ?PCP: Kathyrn Drown, MD ? ?Subjective: ?Chief Complaint  ?Patient presents with  ? Left Knee - Follow-up  ? ? ?HPI: Amy Shelton is a 61 y.o. female who presents to the office complaining of left knee pain.  Patient is here to review MRI of the left knee but unfortunately the power is out so the MRI cannot be reviewed.  She states her pain is improving since she saw Dr. Marlou Sa.  She is walking up to 3 miles now.  Her main complaint is a fullness in the posterior aspect of the knee with increased pain with terminal extension and flexion.  She has pain that aches at night.  She is leaving for a trip to [place] in about a week to see her son for 2 weeks and go fishing.  She would like to try an injection..   ?             ?ROS: All systems reviewed are negative as they relate to the chief complaint within the history of present illness.  Patient denies fevers or chills. ? ?Assessment & Plan: ?Visit Diagnoses:  ?1. Tear of meniscus of left knee, unspecified meniscus, unspecified tear type, unspecified whether old or current tear   ? ? ?Plan: Patient is a 61 year old female who presents for review of MRI left knee.  Unfortunately the power is out in the clinic and the MRI was not able to be reviewed but did discuss the MRI findings.  Her symptoms are improving as she gets further out from her appointment with Dr. Marlou Sa but due to her upcoming trip to [place] next week, she would like to try an injection to see if this will help completely resolve or significantly minimize the symptoms she has and prevent her pain from getting worse when she is more active in [place] with visiting her son.  Left knee was aspirated of 15 cc of nonpurulent synovial fluid and injected with cortisone injection.  Plan for patient  to follow-up in 6 weeks for clinical recheck with Dr. Marlou Sa for further discussion on how much relief she got from the injection and what next steps are available based on her response.  If she is doing extremely well then anticipate follow-up as needed at that point but if she has had recurrence of her symptoms with fleeting relief from the injection, could discuss arthroscopy as an option with her lack of significant arthritis on MRI scan.  Patient understands and agrees with plan.  Follow-up in 6 weeks. ? ?Follow-Up Instructions: No follow-ups on file.  ? ?Orders:  ?No orders of the defined types were placed in this encounter. ? ?No orders of the defined types were placed in this encounter. ? ? ? ? Procedures: ?Large Joint Inj: L knee on 06/30/2021 12:25 PM ?Indications: diagnostic evaluation, joint swelling and pain ?Details: 18 G 1.5 in needle, superolateral approach ? ?Arthrogram: No ? ?Medications: 5 mL lidocaine 1 %; 40 mg methylPREDNISolone acetate 40 MG/ML; 4 mL bupivacaine 0.25 % ?Aspirate: 15 mL blood-tinged ?Outcome: tolerated well, no immediate complications ?Procedure, treatment alternatives, risks and benefits explained, specific risks discussed. Consent was given by the patient. Immediately prior to procedure a  time out was called to verify the correct patient, procedure, equipment, support staff and site/side marked as required. Patient was prepped and draped in the usual sterile fashion.  ? ? ? ? ?Clinical Data: ?No additional findings. ? ?Objective: ?Vital Signs: LMP 01/05/2011  ? ?Physical Exam:  ?Constitutional: Patient appears well-developed ?HEENT:  ?Head: Normocephalic ?Eyes:EOM are normal ?Neck: Normal range of motion ?Cardiovascular: Normal rate ?Pulmonary/chest: Effort normal ?Neurologic: Patient is alert ?Skin: Skin is warm ?Psychiatric: Patient has normal mood and affect ? ?Ortho Exam: Ortho exam demonstrates left knee with small effusion.  Tenderness over the posterior medial joint line,  no tenderness over the lateral joint line.  Able to perform straight leg raise without extensor lag.  No pain with hip range of motion.  No tenderness in the calf.  Negative Homans' sign.  No cellulitis or skin changes noted.  0 degrees extension and flexes to 115 degrees of knee flexion. ? ?Specialty Comments:  ?No specialty comments available. ? ?Imaging: ?No results found. ? ? ?PMFS History: ?Patient Active Problem List  ? Diagnosis Date Noted  ? Breast density 03/27/2021  ? Vitamin D insufficiency 07/27/2019  ? Mild intermittent asthma without complication 30/09/6224  ? Hyperlipidemia 08/18/2018  ? Prediabetes 08/18/2018  ? Impaired fasting glucose 08/18/2018  ? Medial meniscus, posterior horn derangement 12/13/2012  ? OA (osteoarthritis) of knee 12/13/2012  ? Left knee pain 12/13/2012  ? Hypothyroidism 07/07/2012  ? ?Past Medical History:  ?Diagnosis Date  ? Anemia   ? related to heavy vaginal bleeding-has resolved  ? Hypothyroidism   ?  ?Family History  ?Problem Relation Age of Onset  ? Hyperlipidemia Mother   ? Breast cancer Mother 80  ? Hyperlipidemia Father   ? Hypertension Father   ? Kidney disease Father   ? Colon cancer Maternal Aunt   ?  ?Past Surgical History:  ?Procedure Laterality Date  ? BREAST CYST EXCISION Left 1991  ? fibroadenoma removed   ? COLONOSCOPY  02/05/2011  ? Procedure: COLONOSCOPY;  Surgeon: Dorothyann Peng, MD;  [facility];  Service: Endoscopy;  Laterality: N/A;  9:30 AM  ? ?Social History  ? ?Occupational History  ? Not on file  ?Tobacco Use  ? Smoking status: Never  ? Smokeless tobacco: Never  ?Vaping Use  ? Vaping Use: Never used  ?Substance and Sexual Activity  ? Alcohol use: Yes  ?  Alcohol/week: 7.0 standard drinks  ?  Types: 7 Glasses of wine per week  ? Drug use: No  ? Sexual activity: Yes  ?  Birth control/protection: Surgical  ? ? ? ? ?  ?

## 2021-07-10 DIAGNOSIS — S61212A Laceration without foreign body of right middle finger without damage to nail, initial encounter: Secondary | ICD-10-CM | POA: Diagnosis not present

## 2021-07-10 DIAGNOSIS — Z23 Encounter for immunization: Secondary | ICD-10-CM | POA: Diagnosis not present

## 2021-07-10 DIAGNOSIS — W268XXA Contact with other sharp object(s), not elsewhere classified, initial encounter: Secondary | ICD-10-CM | POA: Diagnosis not present

## 2021-08-12 ENCOUNTER — Ambulatory Visit: Payer: 59 | Admitting: Orthopedic Surgery

## 2021-08-12 ENCOUNTER — Encounter: Payer: Self-pay | Admitting: Orthopedic Surgery

## 2021-08-12 DIAGNOSIS — S83207A Unspecified tear of unspecified meniscus, current injury, left knee, initial encounter: Secondary | ICD-10-CM

## 2021-08-12 NOTE — Progress Notes (Signed)
Office Visit Note   Patient: Amy Shelton           Date of Birth: 1960-03-16           MRN: [id number] Visit Date: 08/12/2021 Requested by: Kathyrn Drown, MD [address],  [place] [address] PCP: Kathyrn Drown, MD  Subjective: Chief Complaint  Patient presents with   Left Knee - Follow-up    HPI: Monterrosa is a 61 year old patient with left knee pain and known degenerative medial meniscal tear.  She had an injection in the left knee 06/30/2021 before traveling to [place].  The trip went well and she still maintains about 75 to 85% relief from the injection.  Reports some occasional popping but not much in the way of other mechanical symptoms.  She walks daily about 2 miles a day.  Also using a stationary bike for fitness.  She wants to manage this without surgery if possible.  She is very careful and mindful of the way that she moves in terms of avoiding rotational stress on the knee.  She does have occasional sharp pain in the medial aspect of the knee.  She does have a beach trip coming up.              ROS: All systems reviewed are negative as they relate to the chief complaint within the history of present illness.  Patient denies  fevers or chills.   Assessment & Plan: Visit Diagnoses:  1. Tear of meniscus of left knee, unspecified meniscus, unspecified tear type, unspecified whether old or current tear     Plan: Impression is left knee degenerative medial meniscal tear with trace to mild effusion in the left knee.  She has good range of motion and excellent quad and hamstring strength.  At this time I think the meniscal tear is more degenerative and not necessarily unstable.  Nonetheless there is the potential for the asymmetric meniscal tissue to create an abrasive effect on the joint surface.  That would manifest itself in persistent joint effusions.  As long she does not really go outside of her envelope of function in the knee I think she may be able to maintain  this without surgery.  However should she have a more constant and chronic effusion along with mechanical symptoms of more meniscal instability then arthroscopy and debridement would be indicated to prevent that meniscal tissue which is torn from further damaging the chondral surface particularly on the femoral side.  She is going to see how she does and come back if she has persistent effusion in the knee or development or worsening of mechanical symptoms.  Follow-Up Instructions: Return if symptoms worsen or fail to improve.   Orders:  No orders of the defined types were placed in this encounter.  No orders of the defined types were placed in this encounter.     Procedures: No procedures performed   Clinical Data: No additional findings.  Objective: Vital Signs: LMP 01/05/2011   Physical Exam:   Constitutional: Patient appears well-developed HEENT:  Head: Normocephalic Eyes:EOM are normal Neck: Normal range of motion Cardiovascular: Normal rate Pulmonary/chest: Effort normal Neurologic: Patient is alert Skin: Skin is warm Psychiatric: Patient has normal mood and affect   Ortho Exam: Ortho exam demonstrates trace to mild effusion left knee full range of motion and excellent quad strength.  Mild medial joint line tenderness.  Extensor mechanism intact.  Gait is normal.  Specialty Comments:  No specialty comments  available.  Imaging: No results found.   PMFS History: Patient Active Problem List   Diagnosis Date Noted   Breast density 03/27/2021   Vitamin D insufficiency 07/27/2019   Mild intermittent asthma without complication 16/94/5038   Hyperlipidemia 08/18/2018   Prediabetes 08/18/2018   Impaired fasting glucose 08/18/2018   Medial meniscus, posterior horn derangement 12/13/2012   OA (osteoarthritis) of knee 12/13/2012   Left knee pain 12/13/2012   Hypothyroidism 07/07/2012   Past Medical History:  Diagnosis Date   Anemia    related to heavy vaginal  bleeding-has resolved   Hypothyroidism     Family History  Problem Relation Age of Onset   Hyperlipidemia Mother    Breast cancer Mother 38   Hyperlipidemia Father    Hypertension Father    Kidney disease Father    Colon cancer Maternal Aunt     Past Surgical History:  Procedure Laterality Date   BREAST CYST EXCISION Left 1991   fibroadenoma removed    COLONOSCOPY  02/05/2011   Procedure: COLONOSCOPY;  Surgeon: Dorothyann Peng, MD;  Location[facility];  Service: Endoscopy;  Laterality: N/A;  9:30 AM   Social History   Occupational History   Not on file  Tobacco Use   Smoking status: Never   Smokeless tobacco: Never  Vaping Use   Vaping Use: Never used  Substance and Sexual Activity   Alcohol use: Yes    Alcohol/week: 7.0 standard drinks    Types: 7 Glasses of wine per week   Drug use: No   Sexual activity: Yes    Birth control/protection: Surgical

## 2021-08-31 ENCOUNTER — Encounter: Payer: Self-pay | Admitting: Internal Medicine

## 2021-08-31 ENCOUNTER — Ambulatory Visit: Payer: 59 | Admitting: Internal Medicine

## 2021-08-31 VITALS — BP 142/72 | HR 64 | Ht 64.0 in | Wt 170.0 lb

## 2021-08-31 DIAGNOSIS — I10 Essential (primary) hypertension: Secondary | ICD-10-CM | POA: Diagnosis not present

## 2021-08-31 DIAGNOSIS — E785 Hyperlipidemia, unspecified: Secondary | ICD-10-CM

## 2021-08-31 DIAGNOSIS — R002 Palpitations: Secondary | ICD-10-CM | POA: Diagnosis not present

## 2021-08-31 DIAGNOSIS — I4729 Other ventricular tachycardia: Secondary | ICD-10-CM | POA: Diagnosis not present

## 2021-09-03 ENCOUNTER — Ambulatory Visit: Payer: 59 | Admitting: Internal Medicine

## 2021-09-28 ENCOUNTER — Other Ambulatory Visit (HOSPITAL_COMMUNITY): Payer: Self-pay | Admitting: Nurse Practitioner

## 2021-09-28 DIAGNOSIS — Z1231 Encounter for screening mammogram for malignant neoplasm of breast: Secondary | ICD-10-CM

## 2021-09-30 ENCOUNTER — Ambulatory Visit (HOSPITAL_COMMUNITY): Payer: 59

## 2021-10-08 ENCOUNTER — Ambulatory Visit (HOSPITAL_COMMUNITY)
Admission: RE | Admit: 2021-10-08 | Discharge: 2021-10-08 | Disposition: A | Discharge status: Home / Self Care | Payer: 59 | Source: Ambulatory Visit | Attending: Nurse Practitioner | Admitting: Nurse Practitioner

## 2021-10-08 ENCOUNTER — Telehealth: Payer: Self-pay | Admitting: Family Medicine

## 2021-10-08 DIAGNOSIS — Z1231 Encounter for screening mammogram for malignant neoplasm of breast: Secondary | ICD-10-CM | POA: Insufficient documentation

## 2021-10-08 DIAGNOSIS — Z1211 Encounter for screening for malignant neoplasm of colon: Secondary | ICD-10-CM

## 2021-10-08 NOTE — Telephone Encounter (Signed)
Patient is wanting to know if she needs labs done for physical and also she needs referral put in for Dr, Abbey Chatters for colonoscopy. She states had to cancel last year.

## 2021-10-09 NOTE — Telephone Encounter (Signed)
Amy Simmer, NP     Her yearly labs were done in November so we can wait until then unless she is having issues. Please put in referral for colonoscopy as requested. Thanks.

## 2021-10-09 NOTE — Telephone Encounter (Signed)
Patient notified of provider's recommendations and is currently not having any issues. Referral ordered in Epic. Patient notified

## 2021-10-13 ENCOUNTER — Encounter: Payer: Self-pay | Admitting: *Deleted

## 2021-10-23 ENCOUNTER — Telehealth: Payer: Self-pay

## 2021-10-23 MED ORDER — ALBUTEROL SULFATE HFA 108 (90 BASE) MCG/ACT IN AERS: INHALATION_SPRAY | RESPIRATORY_TRACT | 0 refills | Status: DC

## 2021-10-23 NOTE — Telephone Encounter (Signed)
Encourage patient to contact the pharmacy for refills or they can request refills through [facility]  (Please schedule appointment if patient has not been seen in over a year)    WHAT PHARMACY WOULD THEY LIKE THIS SENT TO: [facility] NAME & DOSE:albuterol (VENTOLIN HFA) 108 (90 Base) MCG/ACT inhaler   NOTES/COMMENTS FROM PATIENT:RX expired needs new refill sent to [facility] office please notify patient: It takes 48-72 hours to process rx refill requests Ask patient to call pharmacy to ensure rx is ready before heading there.

## 2021-11-20 ENCOUNTER — Ambulatory Visit (INDEPENDENT_AMBULATORY_CARE_PROVIDER_SITE_OTHER): Payer: 59 | Admitting: Nurse Practitioner

## 2021-11-20 ENCOUNTER — Encounter: Payer: Self-pay | Admitting: Nurse Practitioner

## 2021-11-20 VITALS — BP 136/84 | Ht 64.0 in | Wt 172.4 lb

## 2021-11-20 DIAGNOSIS — Z124 Encounter for screening for malignant neoplasm of cervix: Secondary | ICD-10-CM | POA: Diagnosis not present

## 2021-11-20 DIAGNOSIS — Z1151 Encounter for screening for human papillomavirus (HPV): Secondary | ICD-10-CM | POA: Diagnosis not present

## 2021-11-20 DIAGNOSIS — E038 Other specified hypothyroidism: Secondary | ICD-10-CM | POA: Diagnosis not present

## 2021-11-20 DIAGNOSIS — Z01419 Encounter for gynecological examination (general) (routine) without abnormal findings: Secondary | ICD-10-CM | POA: Diagnosis not present

## 2021-11-20 DIAGNOSIS — Z23 Encounter for immunization: Secondary | ICD-10-CM | POA: Diagnosis not present

## 2021-11-20 DIAGNOSIS — E063 Autoimmune thyroiditis: Secondary | ICD-10-CM | POA: Diagnosis not present

## 2021-11-20 DIAGNOSIS — R922 Inconclusive mammogram: Secondary | ICD-10-CM | POA: Diagnosis not present

## 2021-11-20 DIAGNOSIS — R69 Illness, unspecified: Secondary | ICD-10-CM | POA: Diagnosis not present

## 2021-11-20 MED ORDER — LEVOTHYROXINE SODIUM 112 MCG PO TABS: 112.0000 ug | ORAL_TABLET | Freq: Every day | ORAL | 0 refills | Status: DC

## 2021-11-20 NOTE — Progress Notes (Deleted)
   Acute Office Visit  Subjective:     Patient ID: QUETZALLI CLOS, female    DOB: 1960/10/02, 61 y.o.   MRN: [id number]  Chief Complaint  Patient presents with   Annual Exam    HPI Patient is in today for Annual well woman exam. Wearing N-95 mask for respiratory precaution. Denies SOB or changes in breathing. Takes daily inhaler. All medication taken as directed. Recent MRI of L knee and heart in July. Heart fine, but small tear in L knee. Fluid removed. No limping. Able to walk 2 miles this morning. Colonoscopy rescheduled and upcoming due to cardiac reaction to prep. Exercise and dietary stable with no changes. Requesting flu vac today and synthroid refill. Plans to get RSV vac in October when insurance changes.      ROS      Objective:    BP 136/84   Ht '5\' 4"'$  (1.626 m)   Wt 78.2 kg   LMP 01/05/2011   BMI 29.59 kg/m  {Vitals History (Optional):23777}  Physical Exam  No results found for any visits on 11/20/21.      Assessment & Plan:   Problem List Items Addressed This Visit   None   No orders of the defined types were placed in this encounter.   No follow-ups on file.  Edd Fabian, RN

## 2021-11-20 NOTE — Progress Notes (Unsigned)
z  Subjective:    Patient ID: Amy Shelton, female    DOB: 12/27/1960, 61 y.o.   MRN: [id number]  HPI  The patient comes in today for a wellness visit.   A review of their health history was completed.  A review of medications was also completed.  Any needed refills; Levothyroxine  Eating habits: trying to eat healthy  Falls/  MVA accidents in past few months: no  Regular exercise: yes  Specialist pt sees on regular basis: cardiology, orthopedics  Preventative health issues were discussed.   Additional concerns:  would like flu vaccine today and discuss RSV vaccine  HPI: Seen today for annual well-woman exam. Denies any physical concerns. Started new beta blocker by cardiology.  Denies wheezing. Taking inhalers daily as prescribed with PRN albuterol. No recent respiratory episodes reported. No changes in marital status, sexual partners, eating habits. Same female partner.Denies vaginal bleeding or discomfort. Colonoscopy scheduled for next week. Last Td received in May '23 but would like Tdap due to expecting grandchild in February. Defers RSV vaccine until October and plans to get Flu shot today.  Regular vision and dental exams.   Review of Systems  Constitutional:  Negative for activity change, appetite change and fatigue.  HENT:  Negative for sore throat and trouble swallowing.   Respiratory:  Negative for cough, chest tightness, shortness of breath and wheezing.   Cardiovascular:  Negative for chest pain.  Gastrointestinal:  Negative for abdominal distention, abdominal pain, constipation, diarrhea, nausea and vomiting.  Genitourinary:  Negative for difficulty urinating, dysuria, enuresis, frequency, genital sores, menstrual problem, pelvic pain, urgency and vaginal discharge.  Neurological:  Negative for dizziness, weakness, numbness and headaches.  Psychiatric/Behavioral:  Negative for sleep disturbance. The patient is not nervous/anxious.       11/20/2021   10:04 AM   Depression screen PHQ 2/9  Decreased Interest 0  Down, Depressed, Hopeless 0  PHQ - 2 Score 0       Objective:   Vitals:   11/20/21 0936  BP: 136/84  Height: '5\' 4"'$  (1.626 m)  Weight: 78.2 kg  BMI (Calculated): 29.58      Physical Exam Vitals and nursing note reviewed. Exam conducted with a chaperone present.  Constitutional:      General: She is not in acute distress.    Appearance: She is well-developed.  Neck:     Thyroid: No thyromegaly.     Trachea: No tracheal deviation.     Comments: Thyroid non tender to palpation. No mass or goiter noted.  Cardiovascular:     Rate and Rhythm: Normal rate and regular rhythm.     Pulses: Normal pulses.     Heart sounds: Normal heart sounds. No murmur heard. Pulmonary:     Effort: Pulmonary effort is normal.     Breath sounds: Normal breath sounds.  Chest:  Breasts:    Right: No swelling, inverted nipple, mass, skin change or tenderness.     Left: No swelling, inverted nipple, mass, skin change or tenderness.  Abdominal:     General: There is no distension.     Palpations: Abdomen is soft.     Tenderness: There is no abdominal tenderness.  Genitourinary:    General: Normal vulva.     Vagina: No vaginal discharge.     Comments: External GU: no rashes or lesions. Vagina: pale, no discharge. Cervix normal in appearance, no CMT. Bimanual exam: no tenderness or obvious masses.  Musculoskeletal:     Cervical back: Normal  range of motion and neck supple.     Right lower leg: No edema.     Left lower leg: No edema.  Lymphadenopathy:     Cervical: No cervical adenopathy.     Upper Body:     Right upper body: No supraclavicular, axillary or pectoral adenopathy.     Left upper body: No supraclavicular, axillary or pectoral adenopathy.  Skin:    General: Skin is warm and dry.     Comments: Sun damaged skin noted. Gets yearly skin cancer screening with dermatology.   Neurological:     Mental Status: She is alert and oriented to  person, place, and time.  Psychiatric:        Mood and Affect: Mood normal.        Behavior: Behavior normal.        Thought Content: Thought content normal.        Judgment: Judgment normal.       Assessment & Plan:   Problem List Items Addressed This Visit       Endocrine   Hypothyroidism   Relevant Medications   levothyroxine (SYNTHROID) 112 MCG tablet     Other   Breast density   Other Visit Diagnoses     Well woman exam    -  Primary   Relevant Orders   IGP, Aptima HPV   Need for vaccination       Relevant Orders   Flu Vaccine QUAD 21moIM (Fluarix, Fluzone & Alfiuria Quad PF) (Completed)   Screening for cervical cancer       Relevant Orders   IGP, Aptima HPV   Screening for HPV (human papillomavirus)       Relevant Orders   IGP, Aptima HPV      Meds ordered this encounter  Medications   levothyroxine (SYNTHROID) 112 MCG tablet    Sig: Take 1 tablet (112 mcg total) by mouth daily.    Dispense:  90 tablet    Refill:  0    Order Specific Question:   Supervising Provider    Answer:   LKathyrn Drown[[id number]]    Plan: Routine labs due in November. Flu shot received today. Discussed RSV vaccine and patient decided to defer until October. Plans to get Tdap in December.  Continue healthy lifestyle habits. Patient to consider referral to genetic clinic due to dense breasts and family history. Will contact our office if she wants this done.   Return in about 1 year (around 11/21/2022) for physical.

## 2021-11-28 LAB — IGP, APTIMA HPV: HPV Aptima: NEGATIVE

## 2021-12-02 DIAGNOSIS — Z23 Encounter for immunization: Secondary | ICD-10-CM | POA: Diagnosis not present

## 2022-01-25 ENCOUNTER — Other Ambulatory Visit: Payer: Self-pay | Admitting: *Deleted

## 2022-01-25 MED ORDER — LEVOTHYROXINE SODIUM 112 MCG PO TABS: 112.0000 ug | ORAL_TABLET | Freq: Every day | ORAL | 1 refills | Status: DC

## 2022-01-25 MED ORDER — ROSUVASTATIN CALCIUM 10 MG PO TABS: 10.0000 mg | ORAL_TABLET | Freq: Every day | ORAL | 1 refills | Status: DC

## 2022-01-25 MED ORDER — PULMICORT FLEXHALER 90 MCG/ACT IN AEPB: 2.0000 | INHALATION_SPRAY | Freq: Two times a day (BID) | RESPIRATORY_TRACT | 1 refills | Status: DC

## 2022-02-24 ENCOUNTER — Telehealth: Payer: Self-pay | Admitting: *Deleted

## 2022-02-24 DIAGNOSIS — E559 Vitamin D deficiency, unspecified: Secondary | ICD-10-CM

## 2022-02-24 DIAGNOSIS — E785 Hyperlipidemia, unspecified: Secondary | ICD-10-CM

## 2022-02-24 DIAGNOSIS — E039 Hypothyroidism, unspecified: Secondary | ICD-10-CM

## 2022-02-24 DIAGNOSIS — R7303 Prediabetes: Secondary | ICD-10-CM

## 2022-02-24 DIAGNOSIS — Z79899 Other long term (current) drug therapy: Secondary | ICD-10-CM

## 2022-02-24 NOTE — Telephone Encounter (Signed)
Patient called and would like to have her blood work done on Friday. Patient would like to to order full labs- repeat all the labs she had in November 2022

## 2022-02-26 NOTE — Telephone Encounter (Signed)
Blood work ordered in EPIC. Patient notified. 

## 2022-03-12 LAB — BASIC METABOLIC PANEL
BUN/Creatinine Ratio: 27 (ref 12–28)
BUN: 23 mg/dL (ref 8–27)
CO2: 22 mmol/L (ref 20–29)
Calcium: 9.6 mg/dL (ref 8.7–10.3)
Chloride: 97 mmol/L (ref 96–106)
Creatinine, Ser: 0.85 mg/dL (ref 0.57–1.00)
Glucose: 98 mg/dL (ref 70–99)
Potassium: 4.6 mmol/L (ref 3.5–5.2)
Sodium: 136 mmol/L (ref 134–144)
eGFR: 78 mL/min/{1.73_m2} (ref >59)

## 2022-03-12 LAB — LIPID PANEL
Chol/HDL Ratio: 2.8 ratio (ref 0.0–4.4)
Cholesterol, Total: 203 mg/dL — ABNORMAL HIGH (ref 100–199)
HDL: 73 mg/dL (ref >39)
LDL Chol Calc (NIH): 115 mg/dL — ABNORMAL HIGH (ref 0–99)
Triglycerides: 84 mg/dL (ref 0–149)
VLDL Cholesterol Cal: 15 mg/dL (ref 5–40)

## 2022-03-12 LAB — TSH: TSH: 3.16 u[IU]/mL (ref 0.450–4.500)

## 2022-03-12 LAB — HEPATIC FUNCTION PANEL
ALT: 21 IU/L (ref 0–32)
AST: 25 IU/L (ref 0–40)
Albumin: 4.9 g/dL (ref 3.9–4.9)
Alkaline Phosphatase: 78 IU/L (ref 44–121)
Bilirubin Total: 0.5 mg/dL (ref 0.0–1.2)
Bilirubin, Direct: 0.13 mg/dL (ref 0.00–0.40)
Total Protein: 7.4 g/dL (ref 6.0–8.5)

## 2022-03-12 LAB — HEMOGLOBIN A1C
Est. average glucose Bld gHb Est-mCnc: 123 mg/dL
Hgb A1c MFr Bld: 5.9 % — ABNORMAL HIGH (ref 4.8–5.6)

## 2022-03-12 LAB — MAGNESIUM: Magnesium: 2.4 mg/dL — ABNORMAL HIGH (ref 1.6–2.3)

## 2022-03-12 LAB — T4, FREE: Free T4: 2.03 ng/dL — ABNORMAL HIGH (ref 0.82–1.77)

## 2022-03-12 LAB — VITAMIN D 25 HYDROXY (VIT D DEFICIENCY, FRACTURES): Vit D, 25-Hydroxy: 31.3 ng/mL (ref 30.0–100.0)

## 2022-04-19 ENCOUNTER — Telehealth: Payer: Self-pay | Admitting: *Deleted

## 2022-04-19 MED ORDER — NIRMATRELVIR/RITONAVIR (PAXLOVID)TABLET: 3.0000 | ORAL_TABLET | Freq: Two times a day (BID) | ORAL | 0 refills | Status: AC

## 2022-04-19 NOTE — Telephone Encounter (Signed)
Patient tested positive for Covid this morning.   Consult with Dr Nicki Reaper:  Lovena Neighbours in Paxlovid standard dosing (GFR 78) to [facility] in [place] Put on note to pharmacy to hold Crestor and Metoprolol while on medication (patient aware)   Prescription sent electronically to pharmacy.  Patient aware.

## 2022-05-19 ENCOUNTER — Other Ambulatory Visit: Payer: Self-pay | Admitting: Nurse Practitioner

## 2022-06-24 ENCOUNTER — Telehealth: Payer: Self-pay

## 2022-06-24 MED ORDER — PULMICORT FLEXHALER 90 MCG/ACT IN AEPB: INHALATION_SPRAY | RESPIRATORY_TRACT | 0 refills | Status: DC

## 2022-06-24 NOTE — Telephone Encounter (Signed)
Prescription Request  06/24/2022  LOV: Visit date not found  What is the name of the medication or equipment? PULMICORT FLEXHALER 90 MCG/ACT inhaler   Have you contacted your pharmacy to request a refill? Yes   Which pharmacy would you like this sent to? [facility]    Patient notified that their request is being sent to the clinical staff for review and that they should receive a response within 2 business days.   Please advise at Mobile [phone] (mobile)

## 2022-06-24 NOTE — Telephone Encounter (Signed)
Refill sent to pharmacy.   

## 2022-07-29 ENCOUNTER — Telehealth: Payer: Self-pay | Admitting: Internal Medicine

## 2022-07-29 MED ORDER — METOPROLOL SUCCINATE ER 25 MG PO TB24: 12.5000 mg | ORAL_TABLET | Freq: Every day | ORAL | 0 refills | Status: DC

## 2022-07-29 NOTE — Telephone Encounter (Signed)
*  STAT* If patient is at the pharmacy, call can be transferred to refill team.   1. Which medications need to be refilled? (please list name of each medication and dose if known)   metoprolol succinate (TOPROL XL) 25 MG 24 hr tablet   2. Which pharmacy/location (including street and city if local pharmacy) is medication to be sent to?  [id number] - [place], [place] - [address]   3. Do they need a 30 day or 90 day supply?  30 day  Patient stated she has 20 tablets left.  Patient has appointment on 6/4.

## 2022-07-29 NOTE — Telephone Encounter (Signed)
Pt's medication was sent to pt's pharmacy as requested. Confirmation received.  °

## 2022-07-30 ENCOUNTER — Other Ambulatory Visit: Payer: Self-pay | Admitting: Family Medicine

## 2022-07-30 ENCOUNTER — Telehealth: Payer: Self-pay | Admitting: Family Medicine

## 2022-07-30 MED ORDER — PULMICORT FLEXHALER 90 MCG/ACT IN AEPB: INHALATION_SPRAY | RESPIRATORY_TRACT | 3 refills | Status: DC

## 2022-07-30 NOTE — Telephone Encounter (Signed)
Refill made per request.

## 2022-07-30 NOTE — Telephone Encounter (Signed)
Refill on Pulmicort 90 mcg flx inh sent to [facility]

## 2022-08-04 NOTE — Progress Notes (Signed)
Cardiology Office Note:    Date:  08/10/2022   ID:  Amy Shelton, DOB 02/19/1961, MRN [id number]  PCP:  Tommie Sams, DO  [facility] Providers Cardiologist:  Orbie Pyo, MD     Referring MD: Babs Sciara, MD   Chief Complaint:  Follow-up     History of Present Illness:   Amy Shelton is a 62 y.o. female wife of Dr. Lubertha South, with history of HTN, HLD, palpitations with SVT and NSVT after colon prep with drop in potassium. Had both  on zio., NSVT with reassuring MRI and echo.   Patient last saw Dr. Lynnette Caffey 08/2021 and was stable.  Patient prefers to keep f/u with PCP and no longer needs to come here. Doing great. She walks a couple miles daily and watching her diet. LDL 114 03/2022 on crestor. Palpitations improved on toprol. Limits caffeine.      Past Medical History:  Diagnosis Date   Anemia    related to heavy vaginal bleeding-has resolved   Hypothyroidism    Current Medications: Current Meds  Medication Sig   albuterol (VENTOLIN HFA) 108 (90 Base) MCG/ACT inhaler INHALE 2 PUFFS INTO THE LUNGS EVERY 4 HOURS AS NEEDED FOR WHEEZING OR SHORTNESS OF BREATH.   Budesonide (PULMICORT FLEXHALER) 90 MCG/ACT inhaler INHALE 2 PUFFS BY MOUTH TWICE DAILY RINSE MOUTH AFTER USE   cholecalciferol (VITAMIN D) 1000 UNITS tablet Take 1,000 Units by mouth daily.     ketoconazole (NIZORAL) 2 % cream Apply 1 application topically 2 (two) times daily. (Patient taking differently: Apply 1 application  topically daily as needed for irritation.)   levothyroxine (SYNTHROID) 112 MCG tablet Take 1 tablet (112 mcg total) by mouth daily.   loratadine (CLARITIN) 10 MG tablet Take 10 mg by mouth daily.   metoprolol succinate (TOPROL XL) 25 MG 24 hr tablet Take 0.5 tablets (12.5 mg total) by mouth daily.   Multiple Vitamin (MULTIVITAMIN) tablet Take 1 tablet by mouth daily. WITH IRON AND VITAMIN D   ondansetron (ZOFRAN ODT) 4 MG disintegrating tablet Take one tablet by mouth every 6-8  hours as needed for nausea   rosuvastatin (CRESTOR) 10 MG tablet Take 1 tablet (10 mg total) by mouth daily.   triamcinolone (NASACORT) 55 MCG/ACT AERO nasal inhaler Place 2 sprays into the nose daily.   valACYclovir (VALTREX) 1000 MG tablet Take 1 tablet by mouth three times a day (Patient taking differently: Take 1,000 mg by mouth daily as needed (fever blisters). Takes as needed for fever blisters.)   vitamin C (ASCORBIC ACID) 500 MG tablet Take 500 mg by mouth daily.    Allergies:   Betadine [povidone iodine] and Latex   Social History   Tobacco Use   Smoking status: Never   Smokeless tobacco: Never  Vaping Use   Vaping Use: Never used  Substance Use Topics   Alcohol use: Yes    Alcohol/week: 7.0 standard drinks of alcohol    Types: 7 Glasses of wine per week   Drug use: No    Family Hx: The patient's family history includes Breast cancer (age of onset: 75) in her mother; Colon cancer in her maternal aunt; Hyperlipidemia in her father and mother; Hypertension in her father; Kidney disease in her father.  ROS     Physical Exam:    VS:  BP 116/84   Pulse 64   Ht 5\' 4"  (1.626 m)   Wt 182 lb (82.6 kg)   LMP 01/05/2011  SpO2 98%   BMI 31.24 kg/m     Wt Readings from Last 3 Encounters:  08/10/22 182 lb (82.6 kg)  11/20/21 172 lb 6.4 oz (78.2 kg)  08/31/21 170 lb (77.1 kg)    Physical Exam  GEN: Well nourished, well developed, in no acute distress  Neck: no JVD, carotid bruits, or masses Cardiac:RRR; some skipping, no murmurs, rubs, or gallops  Respiratory:  clear to auscultation bilaterally, normal work of breathing GI: soft, nontender, nondistended, + BS Ext: without cyanosis, clubbing, or edema, Good distal pulses bilaterally Neuro:  Alert and Oriented x 3,  Psych: euthymic mood, full affect        EKGs/Labs/Other Test Reviewed:    EKG:  EKG is not ordered today.   She can have it done at Dr. Fletcher Anon office.  Recent Labs: 03/11/2022: ALT 21; BUN 23;  Creatinine, Ser 0.85; Magnesium 2.4; Potassium 4.6; Sodium 136; TSH 3.160   Recent Lipid Panel Recent Labs    03/11/22 0747  CHOL 203*  TRIG 84  HDL 73  LDLCALC 115*     Prior CV Studies:     Cardiac MRI 2023 with ejection fraction of 64% and no late gadolinium enhancement    Monitor 12/22  3 Ventricular Tachycardia runs occurred, the run with the fastest interval lasting 3 beats with a max rate of 176 bpm, the longest  lasting 15 beats with an avg rate of 167 bpm.    236 Supraventricular Tachycardia runs occurred, the run with the fastest interval lasting 9.1 secs with a max rate of 218 bpm, the longest lasting 19.0 secs with an avg rate of 120 bpm. Some episodes of    Supraventricular Tachycardia conducted with possible aberrancy. Idioventricular Rhythm was present. Junctional Rhythm was present. Isolated SVEs were occasional (3.2%, [id number]), SVE Couplets were rare (<1.0%, 2254), and SVE Triplets were rare (<1.0%, 1013). Isolated VEs were rare (<1.0%, 1355), VE Couplets were rare (<1.0%, 46), and VE Triplets were rare (<1.0%, 15). Ventricular Bigeminy and Trigeminy were present.   No atrial fibrillation, sustained ventricular tachyarrhythmias, or bradyarrhythmias were detected.   TTE 12/22 1. Left ventricular ejection fraction, by estimation, is 60 to 65%. Left  ventricular ejection fraction by 3D volume is 65 %. The left ventricle has  normal function. The left ventricle has no regional wall motion  abnormalities. Left ventricular diastolic   parameters are consistent with Grade I diastolic dysfunction (impaired  relaxation).   2. Right ventricular systolic function is normal. The right ventricular  size is normal. There is normal pulmonary artery systolic pressure. The  estimated right ventricular systolic pressure is 24.3 mmHg.   3. The mitral valve is grossly normal. Trivial mitral valve  regurgitation.   4. The aortic valve is tricuspid. Aortic valve regurgitation is not   visualized.   5. Aortic dilatation noted. There is borderline dilatation of the aortic  root, measuring 39 mm.   6. The inferior vena cava is normal in size with greater than 50%  respiratory variability, suggesting right atrial pressure of 3 mmHg.     Risk Assessment/Calculations/Metrics:              ASSESSMENT & PLAN:   No problem-specific Assessment & Plan notes found for this encounter.    Palpitations: Can feel on occasion but much better on Toprol .  She would like to Follow-up  prn    Nonsustained ventricular tachycardia: Evaluation including echocardiogram and cardiac MRI has been reassuring.   Hyperlipidemia: This is  being managed by the patient's primary care provider; she does not seem to have any cardiovascular risk equivalents. LDL 115 03/2022 on crestor 10 mg daily. She anats to speak with PCP before increasing.   Hypertension: Her blood pressure well controlled on Toprol XL.               Dispo:  No follow-ups on file.   Medication Adjustments/Labs and Tests Ordered: Current medicines are reviewed at length with the patient today.  Concerns regarding medicines are outlined above.  Tests Ordered: No orders of the defined types were placed in this encounter.  Medication Changes: No orders of the defined types were placed in this encounter.  Signed, Jacolyn Reedy, PA-C  08/10/2022 12:14 PM    [facility] [address] [place], [place], [place]  [address] Phone: [phone]; Fax: [phone]

## 2022-08-10 ENCOUNTER — Ambulatory Visit: Payer: BC Managed Care – PPO | Attending: Physician Assistant | Admitting: Physician Assistant

## 2022-08-10 ENCOUNTER — Encounter: Payer: Self-pay | Admitting: Physician Assistant

## 2022-08-10 VITALS — BP 116/84 | HR 64 | Ht 64.0 in | Wt 182.0 lb

## 2022-08-10 DIAGNOSIS — R002 Palpitations: Secondary | ICD-10-CM | POA: Diagnosis not present

## 2022-08-10 DIAGNOSIS — E785 Hyperlipidemia, unspecified: Secondary | ICD-10-CM | POA: Diagnosis not present

## 2022-08-10 DIAGNOSIS — I4729 Other ventricular tachycardia: Secondary | ICD-10-CM

## 2022-08-10 DIAGNOSIS — I10 Essential (primary) hypertension: Secondary | ICD-10-CM | POA: Diagnosis not present

## 2022-08-10 NOTE — Patient Instructions (Signed)
Medication Instructions:  Your physician recommends that you continue on your current medications as directed. Please refer to the Current Medication list given to you today.  *If you need a refill on your cardiac medications before your next appointment, please call your pharmacy*   Lab Work: None ordered.  If you have labs (blood work) drawn today and your tests are completely normal, you will receive your results only by: MyChart Message (if you have MyChart) OR A paper copy in the mail If you have any lab test that is abnormal or we need to change your treatment, we will call you to review the results.   Testing/Procedures: None ordered.    Follow-Up: At [facility], you and your health needs are our priority.  As part of our continuing mission to provide you with exceptional heart care, we have created designated Provider Care Teams.  These Care Teams include your primary Cardiologist (physician) and Advanced Practice Providers (APPs -  Physician Assistants and Nurse Practitioners) who all work together to provide you with the care you need, when you need it.  We recommend signing up for the patient portal called "MyChart".  Sign up information is provided on this After Visit Summary.  MyChart is used to connect with patients for Virtual Visits (Telemedicine).  Patients are able to view lab/test results, encounter notes, upcoming appointments, etc.  Non-urgent messages can be sent to your provider as well.   To learn more about what you can do with MyChart, go to [email or web address].    Your next appointment:   Follow up with [facility] as needed.

## 2022-08-17 ENCOUNTER — Other Ambulatory Visit: Payer: Self-pay | Admitting: Nurse Practitioner

## 2022-08-20 ENCOUNTER — Other Ambulatory Visit: Payer: Self-pay | Admitting: Nurse Practitioner

## 2022-08-23 ENCOUNTER — Other Ambulatory Visit: Payer: Self-pay

## 2022-08-23 MED ORDER — VALACYCLOVIR HCL 1 G PO TABS
ORAL_TABLET | ORAL | 0 refills | Status: AC
  Filled 2022-08-23: qty 90, fill #0

## 2022-10-23 ENCOUNTER — Other Ambulatory Visit: Payer: Self-pay | Admitting: Internal Medicine

## 2022-11-04 ENCOUNTER — Other Ambulatory Visit (HOSPITAL_COMMUNITY): Payer: Self-pay | Admitting: Nurse Practitioner

## 2022-11-04 DIAGNOSIS — Z1231 Encounter for screening mammogram for malignant neoplasm of breast: Secondary | ICD-10-CM

## 2022-11-05 ENCOUNTER — Encounter (HOSPITAL_COMMUNITY): Payer: Self-pay

## 2022-11-05 ENCOUNTER — Ambulatory Visit (HOSPITAL_COMMUNITY)
Admission: RE | Admit: 2022-11-05 | Discharge: 2022-11-05 | Disposition: A | Discharge status: Home / Self Care | Payer: BC Managed Care – PPO | Source: Ambulatory Visit | Attending: Nurse Practitioner | Admitting: Nurse Practitioner

## 2022-11-05 DIAGNOSIS — Z1231 Encounter for screening mammogram for malignant neoplasm of breast: Secondary | ICD-10-CM

## 2022-11-17 ENCOUNTER — Telehealth: Payer: Self-pay | Admitting: Nurse Practitioner

## 2022-11-17 NOTE — Telephone Encounter (Signed)
Patient has physical on 10/14 and requesting labs, also needing a referral sent to Dr. Elyn Peers office again she has to cancel last appointment.

## 2022-11-18 ENCOUNTER — Encounter: Payer: Self-pay | Admitting: Nurse Practitioner

## 2022-11-18 ENCOUNTER — Other Ambulatory Visit: Payer: Self-pay | Admitting: Nurse Practitioner

## 2022-11-18 DIAGNOSIS — E785 Hyperlipidemia, unspecified: Secondary | ICD-10-CM

## 2022-11-18 DIAGNOSIS — E038 Other specified hypothyroidism: Secondary | ICD-10-CM

## 2022-11-18 DIAGNOSIS — R7301 Impaired fasting glucose: Secondary | ICD-10-CM

## 2022-11-18 DIAGNOSIS — Z130 Encounter for screening for diseases of the blood and blood-forming organs and certain disorders involving the immune mechanism: Secondary | ICD-10-CM

## 2022-11-18 DIAGNOSIS — R7303 Prediabetes: Secondary | ICD-10-CM

## 2022-11-18 DIAGNOSIS — Z1211 Encounter for screening for malignant neoplasm of colon: Secondary | ICD-10-CM

## 2022-11-24 ENCOUNTER — Encounter: Payer: Self-pay | Admitting: *Deleted

## 2022-12-16 LAB — COMPREHENSIVE METABOLIC PANEL
ALT: 25 [IU]/L (ref 0–32)
AST: 29 [IU]/L (ref 0–40)
Albumin: 4.6 g/dL (ref 3.9–4.9)
Alkaline Phosphatase: 71 [IU]/L (ref 44–121)
BUN/Creatinine Ratio: 19 (ref 12–28)
BUN: 15 mg/dL (ref 8–27)
Bilirubin Total: 0.4 mg/dL (ref 0.0–1.2)
CO2: 21 mmol/L (ref 20–29)
Calcium: 8.9 mg/dL (ref 8.7–10.3)
Chloride: 102 mmol/L (ref 96–106)
Creatinine, Ser: 0.77 mg/dL (ref 0.57–1.00)
Globulin, Total: 2.2 g/dL (ref 1.5–4.5)
Glucose: 96 mg/dL (ref 70–99)
Potassium: 4.9 mmol/L (ref 3.5–5.2)
Sodium: 138 mmol/L (ref 134–144)
Total Protein: 6.8 g/dL (ref 6.0–8.5)
eGFR: 87 mL/min/{1.73_m2} (ref >59)

## 2022-12-16 LAB — CBC WITH DIFFERENTIAL/PLATELET
Basophils Absolute: 0 10*3/uL (ref 0.0–0.2)
Basos: 1 %
EOS (ABSOLUTE): 0.1 10*3/uL (ref 0.0–0.4)
Eos: 2 %
Hematocrit: 40.6 % (ref 34.0–46.6)
Hemoglobin: 13.7 g/dL (ref 11.1–15.9)
Immature Grans (Abs): 0 10*3/uL (ref 0.0–0.1)
Immature Granulocytes: 0 %
Lymphocytes Absolute: 1.5 10*3/uL (ref 0.7–3.1)
Lymphs: 31 %
MCH: 33.6 pg — ABNORMAL HIGH (ref 26.6–33.0)
MCHC: 33.7 g/dL (ref 31.5–35.7)
MCV: 100 fL — ABNORMAL HIGH (ref 79–97)
Monocytes Absolute: 0.5 10*3/uL (ref 0.1–0.9)
Monocytes: 10 %
Neutrophils Absolute: 2.7 10*3/uL (ref 1.4–7.0)
Neutrophils: 56 %
Platelets: 282 10*3/uL (ref 150–450)
RBC: 4.08 x10E6/uL (ref 3.77–5.28)
RDW: 11.5 % — ABNORMAL LOW (ref 11.7–15.4)
WBC: 4.9 10*3/uL (ref 3.4–10.8)

## 2022-12-16 LAB — LIPID PANEL
Chol/HDL Ratio: 2.8 {ratio} (ref 0.0–4.4)
Cholesterol, Total: 179 mg/dL (ref 100–199)
HDL: 63 mg/dL (ref >39)
LDL Chol Calc (NIH): 99 mg/dL (ref 0–99)
Triglycerides: 94 mg/dL (ref 0–149)
VLDL Cholesterol Cal: 17 mg/dL (ref 5–40)

## 2022-12-16 LAB — HEMOGLOBIN A1C
Est. average glucose Bld gHb Est-mCnc: 123 mg/dL
Hgb A1c MFr Bld: 5.9 % — ABNORMAL HIGH (ref 4.8–5.6)

## 2022-12-16 LAB — TSH: TSH: 1.65 u[IU]/mL (ref 0.450–4.500)

## 2022-12-20 ENCOUNTER — Ambulatory Visit: Payer: BC Managed Care – PPO | Admitting: Nurse Practitioner

## 2022-12-20 VITALS — BP 118/68 | HR 67 | Temp 97.3°F | Ht 64.0 in | Wt 186.0 lb

## 2022-12-20 DIAGNOSIS — Z01411 Encounter for gynecological examination (general) (routine) with abnormal findings: Secondary | ICD-10-CM

## 2022-12-20 DIAGNOSIS — E785 Hyperlipidemia, unspecified: Secondary | ICD-10-CM

## 2022-12-20 DIAGNOSIS — R7301 Impaired fasting glucose: Secondary | ICD-10-CM | POA: Diagnosis not present

## 2022-12-20 DIAGNOSIS — Z01419 Encounter for gynecological examination (general) (routine) without abnormal findings: Secondary | ICD-10-CM

## 2022-12-20 DIAGNOSIS — E063 Autoimmune thyroiditis: Secondary | ICD-10-CM

## 2022-12-20 MED ORDER — LEVOTHYROXINE SODIUM 112 MCG PO TABS: 112.0000 ug | ORAL_TABLET | Freq: Every day | ORAL | 1 refills | Status: DC

## 2022-12-20 MED ORDER — KETOCONAZOLE 2 % EX CREA: 1.0000 | TOPICAL_CREAM | Freq: Two times a day (BID) | CUTANEOUS | 5 refills | Status: AC

## 2022-12-20 MED ORDER — ROSUVASTATIN CALCIUM 10 MG PO TABS: 10.0000 mg | ORAL_TABLET | Freq: Every day | ORAL | 1 refills | Status: DC

## 2022-12-20 NOTE — Progress Notes (Unsigned)
Subjective:    Patient ID: Amy Shelton, female    DOB: 01-Nov-1960, 62 y.o.   MRN: [id number]  HPI The patient comes in today for a wellness visit.    A review of their health history was completed.  A review of medications was also completed.  Any needed refills; thyroid, cholesterol, ketoconazole cream   Eating habits: She strives to maintain a well-balanced diet, eating vegetables and whole grains, and decreasing her sugar intake.  Falls/  MVA accidents in past few months: no  Regular exercise: walks her dog daily. Has taken some classes at the senior center and wants to increase the amount she is going.  Specialist pt sees on regular basis: dermatology- yearly, cardiology released  Preventative health issues were discussed. She is overdue for her colonoscopy, she is aware and plans to schedule her appointment soon. Mammogram and pap smear up to date. She gets regular dental and vision exams. Her vaccines, including influenza and Covid-19, are up to date.   Additional concerns: none   Review of Systems  Constitutional:  Negative for activity change, appetite change, fatigue and fever.  HENT:  Negative for sore throat and trouble swallowing.   Respiratory:  Negative for cough, chest tightness, shortness of breath and wheezing.   Cardiovascular:  Negative for chest pain.  Gastrointestinal:  Negative for abdominal distention, abdominal pain, blood in stool, constipation, diarrhea, nausea and vomiting.  Genitourinary:  Negative for difficulty urinating, dysuria, enuresis, frequency, genital sores, hematuria, menstrual problem, pelvic pain, urgency, vaginal bleeding and vaginal discharge.  Psychiatric/Behavioral:  Negative for sleep disturbance.    Past Medical History:  Diagnosis Date   Anemia    related to heavy vaginal bleeding-has resolved   Hypothyroidism    Past Surgical History:  Procedure Laterality Date   BREAST CYST EXCISION Left 1991   fibroadenoma removed     COLONOSCOPY  02/05/2011   Procedure: COLONOSCOPY;  Surgeon: Arlyce Harman, MD;  Location: [facility];  Service: Endoscopy;  Laterality: N/A;  9:30 AM    Current Outpatient Medications:    albuterol (VENTOLIN HFA) 108 (90 Base) MCG/ACT inhaler, INHALE 2 PUFFS INTO THE LUNGS EVERY 4 HOURS AS NEEDED FOR WHEEZING OR SHORTNESS OF BREATH., Disp: 54 g, Rfl: 0   Budesonide (PULMICORT FLEXHALER) 90 MCG/ACT inhaler, INHALE 2 PUFFS BY MOUTH TWICE DAILY RINSE MOUTH AFTER USE, Disp: 3 each, Rfl: 3   cholecalciferol (VITAMIN D) 1000 UNITS tablet, Take 1,000 Units by mouth daily.  , Disp: , Rfl:    ketoconazole (NIZORAL) 2 % cream, Apply 1 Application topically 2 (two) times daily., Disp: 15 g, Rfl: 5   levothyroxine (SYNTHROID) 112 MCG tablet, Take 1 tablet (112 mcg total) by mouth daily., Disp: 90 tablet, Rfl: 1   loratadine (CLARITIN) 10 MG tablet, Take 10 mg by mouth daily., Disp: , Rfl:    metoprolol succinate (TOPROL-XL) 25 MG 24 hr tablet, Take 1/2 (one-half) tablet by mouth once daily, Disp: 45 tablet, Rfl: 3   Multiple Vitamin (MULTIVITAMIN) tablet, Take 1 tablet by mouth daily. WITH IRON AND VITAMIN D, Disp: , Rfl:    ondansetron (ZOFRAN ODT) 4 MG disintegrating tablet, Take one tablet by mouth every 6-8 hours as needed for nausea, Disp: 20 tablet, Rfl: 0   rosuvastatin (CRESTOR) 10 MG tablet, Take 1 tablet (10 mg total) by mouth daily., Disp: 90 tablet, Rfl: 1   triamcinolone (NASACORT) 55 MCG/ACT AERO nasal inhaler, Place 2 sprays into the nose daily., Disp: , Rfl:  valACYclovir (VALTREX) 1000 MG tablet, Take 1 tablet by mouth three times a day, Disp: 90 tablet, Rfl: 0   vitamin C (ASCORBIC ACID) 500 MG tablet, Take 500 mg by mouth daily., Disp: , Rfl:      Objective:   Physical Exam Vitals and nursing note reviewed.  Constitutional:      General: She is not in acute distress.    Appearance: Normal appearance. She is well-developed. She is not ill-appearing.  Neck:     Thyroid: No  thyromegaly.     Trachea: No tracheal deviation.     Comments: Thyroid non tender to palpation. No mass or goiter noted.  Cardiovascular:     Rate and Rhythm: Normal rate and regular rhythm.     Heart sounds: Normal heart sounds. No murmur heard. Pulmonary:     Effort: Pulmonary effort is normal.     Breath sounds: Normal breath sounds.  Chest:  Breasts:    Right: No swelling, inverted nipple, mass, skin change or tenderness.     Left: No swelling, inverted nipple, mass, skin change or tenderness.  Abdominal:     General: There is no distension.     Palpations: Abdomen is soft. There is no mass.     Tenderness: There is no abdominal tenderness.  Musculoskeletal:     Cervical back: Normal range of motion and neck supple.  Lymphadenopathy:     Cervical: No cervical adenopathy.     Upper Body:     Right upper body: No supraclavicular, axillary or pectoral adenopathy.     Left upper body: No supraclavicular, axillary or pectoral adenopathy.  Skin:    General: Skin is warm and dry.  Neurological:     Mental Status: She is alert and oriented to person, place, and time.  Psychiatric:        Mood and Affect: Mood normal.        Behavior: Behavior normal.        Thought Content: Thought content normal.        Judgment: Judgment normal.    Vitals:   12/20/22 0820  BP: 118/68  Pulse: 67  Temp: (!) 97.3 F (36.3 C)  SpO2: 99%   Flowsheet Row Office Visit from 12/20/2022 in [facility]  PHQ-9 Total Score 0         12/20/2022    8:33 AM 03/27/2021   12:53 PM  GAD 7 : Generalized Anxiety Score  Nervous, Anxious, on Edge 0 0  Control/stop worrying 0 0  Worry too much - different things 0 0  Trouble relaxing 0 0  Restless 0 0  Easily annoyed or irritable 0 0  Afraid - awful might happen 0 0  Total GAD 7 Score 0 0  Anxiety Difficulty Not difficult at all    Results for orders placed or performed in visit on 11/18/22  CBC with  Differential/Platelet  Result Value Ref Range   WBC 4.9 3.4 - 10.8 x10E3/uL   RBC 4.08 3.77 - 5.28 x10E6/uL   Hemoglobin 13.7 11.1 - 15.9 g/dL   Hematocrit 16.1 09.6 - 46.6 %   MCV 100 (H) 79 - 97 fL   MCH 33.6 (H) 26.6 - 33.0 pg   MCHC 33.7 31.5 - 35.7 g/dL   RDW 04.5 (L) 40.9 - 81.1 %   Platelets 282 150 - 450 x10E3/uL   Neutrophils 56 Not Estab. %   Lymphs 31 Not Estab. %   Monocytes 10 Not Estab. %  Eos 2 Not Estab. %   Basos 1 Not Estab. %   Neutrophils Absolute 2.7 1.4 - 7.0 x10E3/uL   Lymphocytes Absolute 1.5 0.7 - 3.1 x10E3/uL   Monocytes Absolute 0.5 0.1 - 0.9 x10E3/uL   EOS (ABSOLUTE) 0.1 0.0 - 0.4 x10E3/uL   Basophils Absolute 0.0 0.0 - 0.2 x10E3/uL   Immature Granulocytes 0 Not Estab. %   Immature Grans (Abs) 0.0 0.0 - 0.1 x10E3/uL  Comprehensive metabolic panel  Result Value Ref Range   Glucose 96 70 - 99 mg/dL   BUN 15 8 - 27 mg/dL   Creatinine, Ser 8.29 0.57 - 1.00 mg/dL   eGFR 87 >56 OZ/HYQ/6.57   BUN/Creatinine Ratio 19 12 - 28   Sodium 138 134 - 144 mmol/L   Potassium 4.9 3.5 - 5.2 mmol/L   Chloride 102 96 - 106 mmol/L   CO2 21 20 - 29 mmol/L   Calcium 8.9 8.7 - 10.3 mg/dL   Total Protein 6.8 6.0 - 8.5 g/dL   Albumin 4.6 3.9 - 4.9 g/dL   Globulin, Total 2.2 1.5 - 4.5 g/dL   Bilirubin Total 0.4 0.0 - 1.2 mg/dL   Alkaline Phosphatase 71 44 - 121 IU/L   AST 29 0 - 40 IU/L   ALT 25 0 - 32 IU/L  Hemoglobin A1c  Result Value Ref Range   Hgb A1c MFr Bld 5.9 (H) 4.8 - 5.6 %   Est. average glucose Bld gHb Est-mCnc 123 mg/dL  Lipid panel  Result Value Ref Range   Cholesterol, Total 179 100 - 199 mg/dL   Triglycerides 94 0 - 149 mg/dL   HDL 63 >84 mg/dL   VLDL Cholesterol Cal 17 5 - 40 mg/dL   LDL Chol Calc (NIH) 99 0 - 99 mg/dL   Chol/HDL Ratio 2.8 0.0 - 4.4 ratio  TSH  Result Value Ref Range   TSH 1.650 0.450 - 4.500 uIU/mL         Assessment & Plan:   Adult wellness-complete.wellness physical was conducted today. Importance of diet and  exercise were discussed in detail.  Importance of stress reduction and healthy living were discussed.  In addition to this a discussion regarding safety was also covered.  We also reviewed over immunizations and gave recommendations regarding current immunization needed for age.   Preventative health exams needed: Colonoscopy- Recommended scheduling this soon as soon as possible.   Will consider genetic testing for breast cancer after colonoscopy is completed   Patient was advised yearly wellness exam.   Meds ordered this encounter  Medications   rosuvastatin (CRESTOR) 10 MG tablet    Sig: Take 1 tablet (10 mg total) by mouth daily.    Dispense:  90 tablet    Refill:  1   levothyroxine (SYNTHROID) 112 MCG tablet    Sig: Take 1 tablet (112 mcg total) by mouth daily.    Dispense:  90 tablet    Refill:  1   ketoconazole (NIZORAL) 2 % cream    Sig: Apply 1 Application topically 2 (two) times daily.    Dispense:  15 g    Refill:  5

## 2022-12-23 ENCOUNTER — Encounter: Payer: Self-pay | Admitting: Nurse Practitioner

## 2023-02-02 ENCOUNTER — Other Ambulatory Visit: Payer: Self-pay | Admitting: Family Medicine

## 2023-02-07 ENCOUNTER — Encounter: Payer: Self-pay | Admitting: Nurse Practitioner

## 2023-02-07 ENCOUNTER — Other Ambulatory Visit: Payer: Self-pay | Admitting: Family Medicine

## 2023-02-08 MED ORDER — ALBUTEROL SULFATE HFA 108 (90 BASE) MCG/ACT IN AERS: INHALATION_SPRAY | RESPIRATORY_TRACT | 0 refills | Status: DC

## 2023-02-09 ENCOUNTER — Other Ambulatory Visit: Payer: Self-pay | Admitting: Nurse Practitioner

## 2023-02-09 ENCOUNTER — Encounter: Payer: Self-pay | Admitting: Nurse Practitioner

## 2023-03-07 ENCOUNTER — Other Ambulatory Visit: Payer: Self-pay | Admitting: Nurse Practitioner

## 2023-04-04 ENCOUNTER — Other Ambulatory Visit: Payer: Self-pay | Admitting: Nurse Practitioner

## 2023-05-02 ENCOUNTER — Telehealth: Payer: Self-pay | Admitting: *Deleted

## 2023-05-02 ENCOUNTER — Other Ambulatory Visit: Payer: Self-pay | Admitting: Nurse Practitioner

## 2023-05-02 NOTE — Telephone Encounter (Signed)
 Procedure: COLONOSCOPY  Height: 5'4 Weight: 170lbs        Have you had a colonoscopy before?  01/2011 and tried in 2022 but had reaction to suprep that caused AFIB and had to cancel  Do you have family history of colon cancer?  Yes Maternal Aunt  Do you have a family history of polyps? no  Previous colonoscopy with polyps removed? no  Do you have a history colorectal cancer?   no  Are you diabetic?  no  Do you have a prosthetic or mechanical heart valve? no  Do you have a pacemaker/defibrillator?   no  Have you had endocarditis/atrial fibrillation?  Yes afib  Do you use supplemental oxygen/CPAP?  no  Have you had joint replacement within the last 12 months?  no  Do you tend to be constipated or have to use laxatives?  no   Do you have history of alcohol use? If yes, how much and how often.  Yes 1 glass wine  Do you have history or are you using drugs? If yes, what do are you  using?  NO  Have you ever had a stroke/heart attack?  NO  Have you ever had a heart or other vascular stent placed,?NO  Do you take weight loss medication? NO  female patients,: have you had a hysterectomy? NO                              are you post menopausal?  YES                              do you still have your menstrual cycle? NO    Date of last menstrual period? AGE 63  Do you take any blood-thinning medications such as: (Plavix, aspirin, Coumadin, Aggrenox, Brilinta, Xarelto, Eliquis, Pradaxa, Savaysa or Effient)? NO  If yes we need the name, milligram, dosage and who is prescribing doctor:               Current Outpatient Medications  Medication Sig Dispense Refill   albuterol (VENTOLIN HFA) 108 (90 Base) MCG/ACT inhaler INHALE 2 PUFFS INTO THE LUNGS EVERY 4 HOURS AS NEEDED FOR WHEEZING OR SHORTNESS OF BREATH. 54 g 0   cholecalciferol (VITAMIN D) 1000 UNITS tablet Take 1,000 Units by mouth daily.       ketoconazole (NIZORAL) 2 % cream Apply 1 Application topically 2 (two) times  daily. 15 g 5   levothyroxine (SYNTHROID) 112 MCG tablet Take 1 tablet (112 mcg total) by mouth daily. 90 tablet 1   loratadine (CLARITIN) 10 MG tablet Take 10 mg by mouth daily.     metoprolol succinate (TOPROL-XL) 25 MG 24 hr tablet Take 1/2 (one-half) tablet by mouth once daily 45 tablet 3   Multiple Vitamin (MULTIVITAMIN) tablet Take 1 tablet by mouth daily. WITH IRON AND VITAMIN D     ondansetron (ZOFRAN ODT) 4 MG disintegrating tablet Take one tablet by mouth every 6-8 hours as needed for nausea 20 tablet 0   PULMICORT FLEXHALER 90 MCG/ACT inhaler INHALE 2 PUFFS BY MOUTH TWICE DAILY RINSE MOUTH AFTER USE 3 each 0   rosuvastatin (CRESTOR) 10 MG tablet Take 1 tablet (10 mg total) by mouth daily. 90 tablet 1   triamcinolone (NASACORT) 55 MCG/ACT AERO nasal inhaler Place 2 sprays into the nose daily.     valACYclovir (VALTREX) 1000 MG tablet Take 1  tablet by mouth three times a day 90 tablet 0   vitamin C (ASCORBIC ACID) 500 MG tablet Take 500 mg by mouth daily.     No current facility-administered medications for this visit.    Allergies  Allergen Reactions   Betadine [Povidone Iodine] Rash   Latex Rash

## 2023-05-10 ENCOUNTER — Encounter: Payer: Self-pay | Admitting: *Deleted

## 2023-05-10 NOTE — Telephone Encounter (Signed)
 ASA 2 No suprep. Preferably trilyte or miralax prep. Concentrated prep may have caused quick electrolyte shift.

## 2023-05-11 ENCOUNTER — Encounter: Payer: Self-pay | Admitting: *Deleted

## 2023-05-11 NOTE — Telephone Encounter (Signed)
 Pt called in. Unable to get print off mychart. Instructions left for pick up

## 2023-05-11 NOTE — Telephone Encounter (Signed)
 Pt has been scheduled for 05/31/23 with Dr.Carver, instructions for Miralax prep sent to mychart per pt's request.

## 2023-05-16 ENCOUNTER — Encounter (INDEPENDENT_AMBULATORY_CARE_PROVIDER_SITE_OTHER): Payer: Self-pay | Admitting: *Deleted

## 2023-05-16 NOTE — Telephone Encounter (Signed)
 Referral completed, [facility] apt letter sent to PCP

## 2023-05-17 ENCOUNTER — Other Ambulatory Visit: Payer: Self-pay | Admitting: Nurse Practitioner

## 2023-05-31 ENCOUNTER — Encounter (HOSPITAL_COMMUNITY): Payer: Self-pay | Admitting: Internal Medicine

## 2023-05-31 ENCOUNTER — Ambulatory Visit (HOSPITAL_COMMUNITY): Admitting: Anesthesiology

## 2023-05-31 ENCOUNTER — Other Ambulatory Visit: Payer: Self-pay

## 2023-05-31 ENCOUNTER — Encounter (HOSPITAL_COMMUNITY)
Admission: RE | Disposition: A | Discharge status: Home / Self Care | Payer: Self-pay | Source: Home / Self Care | Attending: Internal Medicine

## 2023-05-31 ENCOUNTER — Ambulatory Visit (HOSPITAL_COMMUNITY)
Admission: RE | Admit: 2023-05-31 | Discharge: 2023-05-31 | Disposition: A | Discharge status: Home / Self Care | Attending: Internal Medicine | Admitting: Internal Medicine

## 2023-05-31 DIAGNOSIS — D122 Benign neoplasm of ascending colon: Secondary | ICD-10-CM

## 2023-05-31 DIAGNOSIS — D127 Benign neoplasm of rectosigmoid junction: Secondary | ICD-10-CM

## 2023-05-31 DIAGNOSIS — E039 Hypothyroidism, unspecified: Secondary | ICD-10-CM | POA: Insufficient documentation

## 2023-05-31 DIAGNOSIS — Z1211 Encounter for screening for malignant neoplasm of colon: Secondary | ICD-10-CM | POA: Diagnosis present

## 2023-05-31 DIAGNOSIS — I10 Essential (primary) hypertension: Secondary | ICD-10-CM | POA: Insufficient documentation

## 2023-05-31 DIAGNOSIS — Z800 Family history of malignant neoplasm of digestive organs: Secondary | ICD-10-CM | POA: Insufficient documentation

## 2023-05-31 DIAGNOSIS — J45909 Unspecified asthma, uncomplicated: Secondary | ICD-10-CM | POA: Insufficient documentation

## 2023-05-31 DIAGNOSIS — Z8249 Family history of ischemic heart disease and other diseases of the circulatory system: Secondary | ICD-10-CM | POA: Insufficient documentation

## 2023-05-31 DIAGNOSIS — D128 Benign neoplasm of rectum: Secondary | ICD-10-CM | POA: Diagnosis not present

## 2023-05-31 HISTORY — DX: Cardiac arrhythmia, unspecified: I49.9

## 2023-05-31 HISTORY — PX: POLYPECTOMY: SHX5525

## 2023-05-31 HISTORY — DX: Essential (primary) hypertension: I10

## 2023-05-31 HISTORY — PX: COLONOSCOPY: SHX5424

## 2023-05-31 HISTORY — DX: Unspecified asthma, uncomplicated: J45.909

## 2023-05-31 SURGERY — COLONOSCOPY
Anesthesia: General

## 2023-05-31 MED ORDER — LACTATED RINGERS IV SOLN
INTRAVENOUS | Status: DC
Start: 2023-05-31 — End: 2023-05-31

## 2023-05-31 MED ORDER — PROPOFOL 10 MG/ML IV BOLUS
INTRAVENOUS | Status: DC | PRN
  Administered 2023-05-31: 150 ug/kg/min via INTRAVENOUS
  Administered 2023-05-31: 100 mg via INTRAVENOUS

## 2023-05-31 MED ORDER — EPHEDRINE SULFATE-NACL 50-0.9 MG/10ML-% IV SOSY
PREFILLED_SYRINGE | INTRAVENOUS | Status: DC | PRN
Start: 2023-05-31 — End: 2023-05-31
  Administered 2023-05-31: 10 mg via INTRAVENOUS

## 2023-05-31 NOTE — Anesthesia Preprocedure Evaluation (Signed)
 Anesthesia Evaluation  Patient identified by MRN, date of birth, ID band Patient awake    Reviewed: Allergy & Precautions, H&P , NPO status , Patient's Chart, lab work & pertinent test results, reviewed documented beta blocker date and time   Airway Mallampati: II  TM Distance: >3 FB Neck ROM: full    Dental no notable dental hx.    Pulmonary neg pulmonary ROS, asthma    Pulmonary exam normal breath sounds clear to auscultation       Cardiovascular Exercise Tolerance: Good hypertension, negative cardio ROS + dysrhythmias  Rhythm:regular Rate:Normal     Neuro/Psych negative neurological ROS  negative psych ROS   GI/Hepatic negative GI ROS, Neg liver ROS,,,  Endo/Other  negative endocrine ROSHypothyroidism    Renal/GU negative Renal ROS  negative genitourinary   Musculoskeletal   Abdominal   Peds  Hematology negative hematology ROS (+) Blood dyscrasia, anemia   Anesthesia Other Findings   Reproductive/Obstetrics negative OB ROS                             Anesthesia Physical Anesthesia Plan  ASA: 2  Anesthesia Plan: General   Post-op Pain Management:    Induction:   PONV Risk Score and Plan: Propofol infusion  Airway Management Planned:   Additional Equipment:   Intra-op Plan:   Post-operative Plan:   Informed Consent: I have reviewed the patients History and Physical, chart, labs and discussed the procedure including the risks, benefits and alternatives for the proposed anesthesia with the patient or authorized representative who has indicated his/her understanding and acceptance.     Dental Advisory Given  Plan Discussed with: CRNA  Anesthesia Plan Comments:        Anesthesia Quick Evaluation

## 2023-05-31 NOTE — Transfer of Care (Signed)
 Immediate Anesthesia Transfer of Care Note  Patient: Amy Shelton  Procedure(s) Performed: COLONOSCOPY POLYPECTOMY  Patient Location: Endoscopy Unit  Anesthesia Type:General  Level of Consciousness: awake, alert , and oriented  Airway & Oxygen Therapy: Patient Spontanous Breathing  Post-op Assessment: Report given to RN and Post -op Vital signs reviewed and stable  Post vital signs: Reviewed and stable  Last Vitals:  Vitals Value Taken Time  BP 97/58 05/31/23 1350  Temp 36.4 C 05/31/23 1350  Pulse 59 05/31/23 1350  Resp 18 05/31/23 1350  SpO2 95 % 05/31/23 1350    Last Pain:  Vitals:   05/31/23 1350  TempSrc: Oral  PainSc: 0-No pain      Patients Stated Pain Goal: 8 (05/31/23 1154)  Complications: No notable events documented.

## 2023-05-31 NOTE — Op Note (Signed)
 [facility] Patient Name: Amy Shelton Procedure Date: 05/31/2023 12:28 PM MRN: [id number] Date of Birth: [birth year] Attending MD: Hennie Duos. Marletta Lor , [place], [id number] CSN: [id number] Age: 63 Admit Type: Outpatient Procedure:                Colonoscopy Indications:              Screening for colorectal malignant neoplasm Providers:                Hennie Duos. Marletta Lor, DO, Nena Polio, RN, Dyann Ruddle Referring MD:              Medicines:                See the Anesthesia note for documentation of the                            administered medications Complications:            No immediate complications. Estimated Blood Loss:     Estimated blood loss was minimal. Procedure:                Pre-Anesthesia Assessment:                           - The anesthesia plan was to use monitored                            anesthesia care (MAC).                           After obtaining informed consent, the colonoscope                            was passed under direct vision. Throughout the                            procedure, the patient's blood pressure, pulse, and                            oxygen saturations were monitored continuously. The                            PCF-HQ190L ([id number]) scope was introduced through                            the anus and advanced to the the terminal ileum,                            with identification of the appendiceal orifice and                            IC valve. The colonoscopy was performed without                            difficulty. The patient tolerated the procedure  well. The quality of the bowel preparation was                            evaluated using the BBPS [facility] Bowel Preparation                            Scale) with scores of: Right Colon = 3, Transverse                            Colon = 3 and Left Colon = 3 (entire mucosa seen                            well with no residual staining, small fragments of                             stool or opaque liquid). The total BBPS score                            equals 9. Scope In: 1:25:14 PM Scope Out: 1:45:36 PM Scope Withdrawal Time: 0 hours 12 minutes 44 seconds  Total Procedure Duration: 0 hours 20 minutes 22 seconds  Findings:      The terminal ileum appeared normal.      A 6 mm polyp was found in the ascending colon. The polyp was sessile.       The polyp was removed with a cold snare. Resection and retrieval were       complete.      A 5 mm polyp was found in the rectum. The polyp was sessile. The polyp       was removed with a cold snare. Resection and retrieval were complete.      The exam was otherwise without abnormality. Impression:               - The examined portion of the ileum was normal.                           - One 6 mm polyp in the ascending colon, removed                            with a cold snare. Resected and retrieved.                           - One 5 mm polyp in the rectum, removed with a cold                            snare. Resected and retrieved.                           - The examination was otherwise normal. Moderate Sedation:      Per Anesthesia Care Recommendation:           - Patient has a contact number available for                            emergencies. The  signs and symptoms of potential                            delayed complications were discussed with the                            patient. Return to normal activities tomorrow.                            Written discharge instructions were provided to the                            patient.                           - Resume previous diet.                           - Continue present medications.                           - Await pathology results.                           - Repeat colonoscopy in 7 years for surveillance.                           - Return to GI clinic PRN. Procedure Code(s):        --- Professional ---                            [phone], Colonoscopy, flexible; with removal of                            tumor(s), polyp(s), or other lesion(s) by snare                            technique Diagnosis Code(s):        --- Professional ---                           Z12.11, Encounter for screening for malignant                            neoplasm of colon                           D12.2, Benign neoplasm of ascending colon                           D12.8, Benign neoplasm of rectum CPT copyright 2022 American Medical Association. All rights reserved. The codes documented in this report are preliminary and upon coder review may  be revised to meet current compliance requirements. Hennie Duos. Marletta Lor, DO Hennie Duos. Marletta Lor, DO 05/31/2023 1:49:00 PM This report has been signed electronically. Number of Addenda: 0

## 2023-05-31 NOTE — H&P (Signed)
 Primary Care Physician:  Campbell Riches, NP Primary Gastroenterologist:  Dr. Marletta Lor  Pre-Procedure History & Physical: HPI:  Amy Shelton is a 63 y.o. female is here for a colonoscopy for colon cancer screening purposes.   Past Medical History:  Diagnosis Date   Anemia    related to heavy vaginal bleeding-has resolved   Asthma    Dysrhythmia    Hypertension    Hypothyroidism     Past Surgical History:  Procedure Laterality Date   BREAST CYST EXCISION Left 1991   fibroadenoma removed    COLONOSCOPY  02/05/2011   Procedure: COLONOSCOPY;  Surgeon: Arlyce Harman, MD;  [facility];  Service: Endoscopy;  Laterality: N/A;  9:30 AM    Prior to Admission medications   Medication Sig Start Date End Date Taking? Authorizing Provider  albuterol (VENTOLIN HFA) 108 (90 Base) MCG/ACT inhaler INHALE 2 PUFFS INTO THE LUNGS EVERY 4 HOURS AS NEEDED FOR WHEEZING OR SHORTNESS OF BREATH. 02/08/23   Tommie Sams, DO  cholecalciferol (VITAMIN D) 1000 UNITS tablet Take 1,000 Units by mouth daily.     Yes [provider]  levothyroxine (SYNTHROID) 112 MCG tablet Take 1 tablet by mouth once daily 05/18/23  Yes Campbell Riches, NP  loratadine (CLARITIN) 10 MG tablet Take 10 mg by mouth daily.   Yes [provider]  metoprolol succinate (TOPROL-XL) 25 MG 24 hr tablet Take 1/2 (one-half) tablet by mouth once daily 10/25/22  Yes Orbie Pyo, MD  Multiple Vitamin (MULTIVITAMIN) tablet Take 1 tablet by mouth daily. WITH IRON AND VITAMIN D   Yes [provider]  PULMICORT FLEXHALER 90 MCG/ACT inhaler INHALE 2 PUFFS BY MOUTH TWICE DAILY RINSE MOUTH AFTER USE 05/02/23  Yes Sherie Don C, NP  rosuvastatin (CRESTOR) 10 MG tablet Take 1 tablet (10 mg total) by mouth daily. 12/20/22  Yes Campbell Riches, NP  triamcinolone (NASACORT) 55 MCG/ACT AERO nasal inhaler Place 2 sprays into the nose daily.   Yes [provider]  valACYclovir (VALTREX) 1000 MG  tablet Take 1 tablet by mouth three times a day 08/23/22  Yes Campbell Riches, NP  vitamin C (ASCORBIC ACID) 500 MG tablet Take 500 mg by mouth daily.   Yes [provider]  ketoconazole (NIZORAL) 2 % cream Apply 1 Application topically 2 (two) times daily. 12/20/22   Campbell Riches, NP  ondansetron (ZOFRAN ODT) 4 MG disintegrating tablet Take one tablet by mouth every 6-8 hours as needed for nausea 03/02/21   Babs Sciara, MD    Allergies as of 05/11/2023 - Review Complete 05/02/2023  Allergen Reaction Noted   Betadine [povidone iodine] Rash 01/17/2018   Latex Rash 09/13/2014    Family History  Problem Relation Age of Onset   Hyperlipidemia Mother    Breast cancer Mother 2   Hyperlipidemia Father    Hypertension Father    Kidney disease Father    Colon cancer Maternal Aunt     Social History   Socioeconomic History   Marital status: Married    Spouse name: Not on file   Number of children: Not on file   Years of education: Not on file   Highest education level: Not on file  Occupational History   Not on file  Tobacco Use   Smoking status: Never   Smokeless tobacco: Never  Vaping Use   Vaping status: Never Used  Substance and Sexual Activity   Alcohol use: Yes  Alcohol/week: 7.0 standard drinks of alcohol    Types: 7 Glasses of wine per week   Drug use: No   Sexual activity: Yes    Birth control/protection: Surgical  Other Topics Concern   Not on file  Social History Narrative   Not on file   Social Drivers of Health   Financial Resource Strain: Not on file  Food Insecurity: Not on file  Transportation Needs: Not on file  Physical Activity: Not on file  Stress: Not on file  Social Connections: Not on file  Intimate Partner Violence: Not on file    Review of Systems: See HPI, otherwise negative ROS  Physical Exam: Vital signs in last 24 hours: Temp:  [98.2 F (36.8 C)] 98.2 F (36.8 C) (03/25 1205) Pulse Rate:  [55] 55 (03/25  1205) Resp:  [16] 16 (03/25 1205) BP: (128)/(78) 128/78 (03/25 1205) SpO2:  [93 %] 93 % (03/25 1205) Weight:  [73.9 kg] 73.9 kg (03/25 1154)   General:   Alert,  Well-developed, well-nourished, pleasant and cooperative in NAD Head:  Normocephalic and atraumatic. Eyes:  Sclera clear, no icterus.   Conjunctiva pink. Ears:  Normal auditory acuity. Nose:  No deformity, discharge,  or lesions. Msk:  Symmetrical without gross deformities. Normal posture. Extremities:  Without clubbing or edema. Neurologic:  Alert and  oriented x4;  grossly normal neurologically. Skin:  Intact without significant lesions or rashes. Psych:  Alert and cooperative. Normal mood and affect.  Impression/Plan: Amy Shelton is here for a colonoscopy to be performed for colon cancer screening purposes.  The risks of the procedure including infection, bleed, or perforation as well as benefits, limitations, alternatives and imponderables have been reviewed with the patient. Questions have been answered. All parties agreeable.

## 2023-06-01 ENCOUNTER — Encounter (HOSPITAL_COMMUNITY): Payer: Self-pay | Admitting: Internal Medicine

## 2023-06-01 LAB — SURGICAL PATHOLOGY

## 2023-06-03 NOTE — Anesthesia Postprocedure Evaluation (Signed)
 Anesthesia Post Note  Patient: Amy Shelton  Procedure(s) Performed: COLONOSCOPY POLYPECTOMY  Patient location during evaluation: Phase II Anesthesia Type: General Level of consciousness: awake Pain management: pain level controlled Vital Signs Assessment: post-procedure vital signs reviewed and stable Respiratory status: spontaneous breathing and respiratory function stable Cardiovascular status: blood pressure returned to baseline and stable Postop Assessment: no headache and no apparent nausea or vomiting Anesthetic complications: no Comments: Late entry   No notable events documented.   Last Vitals:  Vitals:   05/31/23 1205 05/31/23 1350  BP: 128/78 (!) 97/58  Pulse: (!) 55 (!) 59  Resp: 16 18  Temp: 36.8 C 36.4 C  SpO2: 93% 95%    Last Pain:  Vitals:   05/31/23 1350  TempSrc: Oral  PainSc: 0-No pain                 Windell Norfolk

## 2023-07-27 ENCOUNTER — Other Ambulatory Visit: Payer: Self-pay

## 2023-07-27 ENCOUNTER — Telehealth: Payer: Self-pay | Admitting: Nurse Practitioner

## 2023-07-27 MED ORDER — PULMICORT FLEXHALER 90 MCG/ACT IN AEPB: INHALATION_SPRAY | RESPIRATORY_TRACT | 2 refills | Status: DC

## 2023-07-28 ENCOUNTER — Telehealth: Payer: Self-pay | Admitting: Family Medicine

## 2023-07-28 ENCOUNTER — Other Ambulatory Visit: Payer: Self-pay

## 2023-07-28 MED ORDER — ALBUTEROL SULFATE HFA 108 (90 BASE) MCG/ACT IN AERS: INHALATION_SPRAY | RESPIRATORY_TRACT | 0 refills | Status: DC

## 2023-07-28 NOTE — Telephone Encounter (Signed)
 Refill on inhaler. Sent to [facility]

## 2023-07-28 NOTE — Telephone Encounter (Signed)
 Please close message already has been sent

## 2023-08-12 ENCOUNTER — Other Ambulatory Visit: Payer: Self-pay | Admitting: Nurse Practitioner

## 2023-09-27 ENCOUNTER — Other Ambulatory Visit: Payer: Self-pay | Admitting: Internal Medicine

## 2023-10-16 ENCOUNTER — Other Ambulatory Visit: Payer: Self-pay | Admitting: Family Medicine

## 2023-11-14 ENCOUNTER — Telehealth: Payer: Self-pay

## 2023-11-14 NOTE — Telephone Encounter (Signed)
 Prescription Request  11/14/2023  LOV: Visit date not found  What is the name of the medication or equipment? Budesonide  (PULMICORT  FLEXHALER) 90 MCG/ACT inhaler   Have you contacted your pharmacy to request a refill? No   Which pharmacy would you like this sent to?  [facility] [address] [place] - [address] [place] [address] Phone: [phone] Fax: [phone]    Patient notified that their request is being sent to the clinical staff for review and that they should receive a response within 2 business days.   Please advise at Mobile [phone] (mobile)

## 2023-11-15 ENCOUNTER — Telehealth: Payer: Self-pay | Admitting: Family Medicine

## 2023-11-15 NOTE — Telephone Encounter (Unsigned)
 Copied from CRM #[id number]. Topic: Clinical - Medication Refill >> Nov 15, 2023  9:23 AM Amy Shelton wrote: Medication: Covid booster shot  Has the patient contacted their pharmacy? Yes (Agent: If no, request that the patient contact the pharmacy for the refill. If patient does not wish to contact the pharmacy document the reason why and proceed with request.) (Agent: If yes, when and what did the pharmacy advise?)  This is the patient's preferred pharmacy:  [facility] [address], [place] - [address] [place] [address] Phone: [phone] Fax: [phone]  Is this the correct pharmacy for this prescription? Yes If no, delete pharmacy and type the correct one.   Has the prescription been filled recently? No  Is the patient out of the medication? Yes  Has the patient been seen for an appointment in the last year OR does the patient have an upcoming appointment? Yes  Can we respond through MyChart? Yes  Agent: Please be advised that Rx refills may take up to 3 business days. We ask that you follow-up with your pharmacy.

## 2023-11-21 ENCOUNTER — Other Ambulatory Visit (INDEPENDENT_AMBULATORY_CARE_PROVIDER_SITE_OTHER): Payer: Self-pay

## 2023-11-21 ENCOUNTER — Ambulatory Visit: Admitting: Orthopedic Surgery

## 2023-11-21 ENCOUNTER — Encounter: Payer: Self-pay | Admitting: Orthopedic Surgery

## 2023-11-21 DIAGNOSIS — M25562 Pain in left knee: Secondary | ICD-10-CM

## 2023-11-21 DIAGNOSIS — M1712 Unilateral primary osteoarthritis, left knee: Secondary | ICD-10-CM

## 2023-11-21 NOTE — Progress Notes (Unsigned)
 Office Visit Note   Patient: Amy Shelton           Date of Birth: 02-Jan-1961           MRN: [id number] Visit Date: 11/21/2023 Requested by: Cook, Jayce G, DO [address] [place],  [place] [address] PCP: Cook, Jayce G, DO  Subjective: No chief complaint on file.   HPI: Amy Shelton is a 63 y.o. female who presents to the office reporting left knee pain.  She did have an MRI scan in 2023 which did show complex medial meniscal tear.  She had an injection at that time and has actually done well with that injection up until recently.  Currently she does report pain with twisting.  This does keep her from walking on uneven surfaces like hiking as well as walking on the beach.  She is limited in cardio class as well.  She does report some stiffness if she sits for more than 2 hours.  Has some occasional swelling medially.  Pain does not wake her from sleep at night.  For the past year she has had occasional flares but they are becoming more frequent.  States that when she walks on the beach for about 10 minutes she has some pain in that knee for the next 2 to 3 weeks.  She has lost 30 pounds doing primarily loadbearing exercise.  Pain is primarily medial..                ROS: All systems reviewed are negative as they relate to the chief complaint within the history of present illness.  Patient denies fevers or chills.  Assessment & Plan: Visit Diagnoses:  1. Left knee pain, unspecified chronicity   2. Primary osteoarthritis of left knee     Plan: Impression is symptomatic left knee meniscal tear with medial sided pain but no effusion.  She has not really developed any progressive arthritis compared to radiographs from 2 years ago.  In general I think that arthroscopy and meniscal debridement would give her improvement in the pain she is having but it may not be completely resolved.  I do think that she would have more than 50% volume and that meniscus remaining based on the MRI scan from 2  years ago.  The risk and benefits are discussed including 1-2 incomplete pain relief knee stiffness as well as some loss of function.  No personal or family history of DVT or pulmonary embolism but her threshold for an ultrasound several days after her surgery would be very low.  She will follow-up and likely schedule for January.  All questions answered.  Follow-Up Instructions: No follow-ups on file.   Orders:  No orders of the defined types were placed in this encounter.  No orders of the defined types were placed in this encounter.     Procedures: No procedures performed   Clinical Data: No additional findings.  Objective: Vital Signs: LMP 01/05/2011   Physical Exam:  Constitutional: Patient appears well-developed HEENT:  Head: Normocephalic Eyes:EOM are normal Neck: Normal range of motion Cardiovascular: Normal rate Pulmonary/chest: Effort normal Neurologic: Patient is alert Skin: Skin is warm Psychiatric: Patient has normal mood and affect  Ortho Exam: Ortho exam demonstrates normal gait and alignment.  Excellent quad tone bilaterally.  Full range of motion bilaterally does have some medial joint line tenderness which is mild on the left not present on the right.  Collateral crucial ligaments are stable.  No groin pain  with internal/external Tatian of the leg.  Pedal pulses palpable.  Specialty Comments:  No specialty comments available.  Imaging: No results found.   PMFS History: Patient Active Problem List   Diagnosis Date Noted   Breast density 03/27/2021   Vitamin D  insufficiency 07/27/2019   Mild intermittent asthma without complication 08/18/2018   Hyperlipidemia 08/18/2018   Prediabetes 08/18/2018   Impaired fasting glucose 08/18/2018   Medial meniscus, posterior horn derangement 12/13/2012   OA (osteoarthritis) of knee 12/13/2012   Left knee pain 12/13/2012   Hypothyroidism 07/07/2012   Past Medical History:  Diagnosis Date   Anemia    related  to heavy vaginal bleeding-has resolved   Asthma    Dysrhythmia    Hypertension    Hypothyroidism     Family History  Problem Relation Age of Onset   Hyperlipidemia Mother    Breast cancer Mother 61   Hyperlipidemia Father    Hypertension Father    Kidney disease Father    Colon cancer Maternal Aunt     Past Surgical History:  Procedure Laterality Date   BREAST CYST EXCISION Left 1991   fibroadenoma removed    COLONOSCOPY  02/05/2011   Procedure: COLONOSCOPY;  Surgeon: Margo CHRISTELLA Haddock, MD;  [facility];  Service: Endoscopy;  Laterality: N/A;  9:30 AM   COLONOSCOPY N/A 05/31/2023   Procedure: COLONOSCOPY;  Surgeon: Cindie Carlin POUR, DO;  [facility];  Service: Endoscopy;  Laterality: N/A;  2:00 pm, asa 2, pt knows to arrive at 11:30   POLYPECTOMY  05/31/2023   Procedure: POLYPECTOMY;  Surgeon: Cindie Carlin POUR, DO;  [facility]: [facility];  Service: Endoscopy;;   Social History   Occupational History   Not on file  Tobacco Use   Smoking status: Never   Smokeless tobacco: Never  Vaping Use   Vaping status: Never Used  Substance and Sexual Activity   Alcohol use: Yes    Alcohol/week: 7.0 standard drinks of alcohol    Types: 7 Glasses of wine per week   Drug use: No   Sexual activity: Yes    Birth control/protection: Surgical

## 2023-11-22 ENCOUNTER — Encounter: Payer: Self-pay | Admitting: Orthopedic Surgery

## 2023-11-22 ENCOUNTER — Other Ambulatory Visit (HOSPITAL_COMMUNITY): Payer: Self-pay | Admitting: Nurse Practitioner

## 2023-11-22 ENCOUNTER — Other Ambulatory Visit: Payer: Self-pay | Admitting: Nurse Practitioner

## 2023-11-22 ENCOUNTER — Encounter: Payer: Self-pay | Admitting: Nurse Practitioner

## 2023-11-22 DIAGNOSIS — Z1231 Encounter for screening mammogram for malignant neoplasm of breast: Secondary | ICD-10-CM

## 2023-11-22 DIAGNOSIS — R5383 Other fatigue: Secondary | ICD-10-CM

## 2023-11-22 DIAGNOSIS — E785 Hyperlipidemia, unspecified: Secondary | ICD-10-CM

## 2023-11-22 DIAGNOSIS — E063 Autoimmune thyroiditis: Secondary | ICD-10-CM

## 2023-11-22 DIAGNOSIS — R7303 Prediabetes: Secondary | ICD-10-CM

## 2023-11-24 ENCOUNTER — Encounter (HOSPITAL_COMMUNITY): Payer: Self-pay

## 2023-11-24 ENCOUNTER — Ambulatory Visit (HOSPITAL_COMMUNITY)
Admission: RE | Admit: 2023-11-24 | Discharge: 2023-11-24 | Disposition: A | Discharge status: Home / Self Care | Source: Ambulatory Visit | Attending: Nurse Practitioner | Admitting: Nurse Practitioner

## 2023-11-24 DIAGNOSIS — Z1231 Encounter for screening mammogram for malignant neoplasm of breast: Secondary | ICD-10-CM | POA: Diagnosis present

## 2023-12-02 ENCOUNTER — Other Ambulatory Visit: Payer: Self-pay | Admitting: Nurse Practitioner

## 2023-12-02 ENCOUNTER — Other Ambulatory Visit: Payer: Self-pay | Admitting: Internal Medicine

## 2023-12-06 LAB — HEMOGLOBIN A1C
Est. average glucose Bld gHb Est-mCnc: 123 mg/dL
Hgb A1c MFr Bld: 5.9 % — ABNORMAL HIGH (ref 4.8–5.6)

## 2023-12-06 LAB — COMPREHENSIVE METABOLIC PANEL WITH GFR
ALT: 17 IU/L (ref 0–32)
AST: 17 IU/L (ref 0–40)
Albumin: 4.7 g/dL (ref 3.9–4.9)
Alkaline Phosphatase: 63 IU/L (ref 49–135)
BUN/Creatinine Ratio: 24 (ref 12–28)
BUN: 17 mg/dL (ref 8–27)
Bilirubin Total: 0.5 mg/dL (ref 0.0–1.2)
CO2: 22 mmol/L (ref 20–29)
Calcium: 9.5 mg/dL (ref 8.7–10.3)
Chloride: 101 mmol/L (ref 96–106)
Creatinine, Ser: 0.71 mg/dL (ref 0.57–1.00)
Globulin, Total: 2 g/dL (ref 1.5–4.5)
Glucose: 86 mg/dL (ref 70–99)
Potassium: 4.5 mmol/L (ref 3.5–5.2)
Sodium: 136 mmol/L (ref 134–144)
Total Protein: 6.7 g/dL (ref 6.0–8.5)
eGFR: 95 mL/min/1.73 (ref >59)

## 2023-12-06 LAB — CBC WITH DIFFERENTIAL/PLATELET
Basophils Absolute: 0 x10E3/uL (ref 0.0–0.2)
Basos: 1 %
EOS (ABSOLUTE): 0.1 x10E3/uL (ref 0.0–0.4)
Eos: 2 %
Hematocrit: 40.9 % (ref 34.0–46.6)
Hemoglobin: 13.4 g/dL (ref 11.1–15.9)
Immature Grans (Abs): 0 x10E3/uL (ref 0.0–0.1)
Immature Granulocytes: 0 %
Lymphocytes Absolute: 2 x10E3/uL (ref 0.7–3.1)
Lymphs: 35 %
MCH: 32.4 pg (ref 26.6–33.0)
MCHC: 32.8 g/dL (ref 31.5–35.7)
MCV: 99 fL — ABNORMAL HIGH (ref 79–97)
Monocytes Absolute: 0.5 x10E3/uL (ref 0.1–0.9)
Monocytes: 9 %
Neutrophils Absolute: 3.1 x10E3/uL (ref 1.4–7.0)
Neutrophils: 53 %
Platelets: 257 x10E3/uL (ref 150–450)
RBC: 4.14 x10E6/uL (ref 3.77–5.28)
RDW: 12 % (ref 11.7–15.4)
WBC: 5.8 x10E3/uL (ref 3.4–10.8)

## 2023-12-06 LAB — LIPID PANEL
Chol/HDL Ratio: 2.6 ratio (ref 0.0–4.4)
Cholesterol, Total: 166 mg/dL (ref 100–199)
HDL: 65 mg/dL (ref >39)
LDL Chol Calc (NIH): 90 mg/dL (ref 0–99)
Triglycerides: 57 mg/dL (ref 0–149)
VLDL Cholesterol Cal: 11 mg/dL (ref 5–40)

## 2023-12-06 LAB — TSH: TSH: 1.86 u[IU]/mL (ref 0.450–4.500)

## 2023-12-22 ENCOUNTER — Telehealth: Payer: Self-pay | Admitting: Orthopedic Surgery

## 2023-12-22 NOTE — Telephone Encounter (Addendum)
 Patient is scheduled for left knee arthroscopy, meniscal debridement at [facility] 03/12/24.  Post operative appointment is set for 03/19/24 with [facility].  A ultrasound appointment was also requested by patient to rule out a blood clot 2 days after surgery.  This appointment is for 03/14/24 at 1pm with Dr. Addie.  [employer] will reach out to patient closer to the surgery date to discuss delivery of the CPM machine for the knee. Patient is aware of location, date and time of surgery.   Patient does have questions about compression therapy and questions relating to the recovery timeline.  She is requesting a call back from medical staff.   Please call patient at [phone]

## 2023-12-30 ENCOUNTER — Other Ambulatory Visit: Payer: Self-pay | Admitting: Nurse Practitioner

## 2024-01-03 ENCOUNTER — Ambulatory Visit (INDEPENDENT_AMBULATORY_CARE_PROVIDER_SITE_OTHER): Admitting: Nurse Practitioner

## 2024-01-03 VITALS — BP 121/79 | Temp 97.9°F | Ht 64.0 in | Wt 159.0 lb

## 2024-01-03 DIAGNOSIS — E785 Hyperlipidemia, unspecified: Secondary | ICD-10-CM

## 2024-01-03 DIAGNOSIS — Z01419 Encounter for gynecological examination (general) (routine) without abnormal findings: Secondary | ICD-10-CM

## 2024-01-03 DIAGNOSIS — J4520 Mild intermittent asthma, uncomplicated: Secondary | ICD-10-CM

## 2024-01-03 DIAGNOSIS — Z01411 Encounter for gynecological examination (general) (routine) with abnormal findings: Secondary | ICD-10-CM

## 2024-01-03 DIAGNOSIS — E063 Autoimmune thyroiditis: Secondary | ICD-10-CM | POA: Diagnosis not present

## 2024-01-03 DIAGNOSIS — Z87898 Personal history of other specified conditions: Secondary | ICD-10-CM

## 2024-01-03 DIAGNOSIS — Z803 Family history of malignant neoplasm of breast: Secondary | ICD-10-CM | POA: Insufficient documentation

## 2024-01-03 MED ORDER — LEVOTHYROXINE SODIUM 112 MCG PO TABS: 112.0000 ug | ORAL_TABLET | Freq: Every day | ORAL | 3 refills | Status: AC

## 2024-01-03 MED ORDER — ROSUVASTATIN CALCIUM 10 MG PO TABS: 10.0000 mg | ORAL_TABLET | Freq: Every day | ORAL | 3 refills | Status: AC

## 2024-01-03 MED ORDER — PULMICORT FLEXHALER 90 MCG/ACT IN AEPB
INHALATION_SPRAY | RESPIRATORY_TRACT | 3 refills | Status: DC
Start: 2024-01-03 — End: 2024-01-30

## 2024-01-03 MED ORDER — ALBUTEROL SULFATE HFA 108 (90 BASE) MCG/ACT IN AERS: INHALATION_SPRAY | RESPIRATORY_TRACT | 0 refills | Status: AC

## 2024-01-03 MED ORDER — METOPROLOL SUCCINATE ER 25 MG PO TB24: ORAL_TABLET | ORAL | 3 refills | Status: AC

## 2024-01-03 NOTE — Progress Notes (Unsigned)
 Subjective:    Patient ID: Amy Shelton, female    DOB: 03-Jan-1961, 63 y.o.   MRN: [id number]  HPI The patient comes in today for a wellness visit.    A review of their health history was completed.  A review of medications was also completed.  Any needed refills; yes refill all 1 yr  Eating habits: good  Falls/  MVA accidents in past few months: no    Regular exercise: walk , weights, cardio 1 hr class 2 weekly   Specialist pt sees on regular basis: no  Preventative health issues were discussed.   Discussed the use of AI scribe software for clinical note transcription with the patient, who gave verbal consent to proceed.  History of Present Illness Amy Shelton is a 63 year old female who presents for an annual physical exam.  She has experienced a weight loss of 30 pounds since March, now weighing approximately 150 pounds. This weight loss is attributed to healthy habits and increased physical activity. She is currently taking metoprolol  12.5 mg at night. Initially, she experienced occasional dizziness, but this has resolved. Her pulse occasionally drops into the forties, but not below 42 bpm. She has not had recent episodes of atrial fibrillation, though she experiences occasional irregular heartbeats which are asymptomatic.   She is scheduled for elective meniscus surgery on January 5th due to knee issues causing it to 'hang' during certain movements.   She uses Pulmicort , two puffs twice a day, and rarely experiences wheezing except during colds or dry weather. She uses albuterol  as needed during these times.  Recent blood work shows stable thyroid  function, and she is on a consistent dose of thyroid  medication. Her A1c is slightly above normal, but glucose and triglycerides have decreased significantly. Her LDL cholesterol has dropped to 90, and her HDL cholesterol remains above 60, which she attributes to her diet and exercise regimen. She continues to take rosuvastatin   due to a family history of high cholesterol.  No chest pain, shortness of breath, coughing, or wheezing during exercise. No issues with sore throats or difficulty swallowing. No vaginal discharge, burning, irritation, rash, or bleeding since her last physical exam.  Same female sexual partner. No vaginal bleeding post menopause.   Review of Systems  Constitutional:  Negative for activity change, appetite change and fatigue.  HENT:  Negative for sore throat and trouble swallowing.   Respiratory:  Negative for cough, chest tightness, shortness of breath and wheezing.   Cardiovascular:  Negative for chest pain and palpitations.  Gastrointestinal:  Negative for abdominal distention, abdominal pain, blood in stool, constipation, diarrhea, nausea and vomiting.  Genitourinary:  Negative for difficulty urinating, dysuria, frequency, genital sores, pelvic pain, urgency, vaginal bleeding and vaginal discharge.      01/03/2024    1:53 PM  Depression screen PHQ 2/9  Decreased Interest 0  Down, Depressed, Hopeless 0  PHQ - 2 Score 0  Altered sleeping 0  Tired, decreased energy 0  Change in appetite 0  Feeling bad or failure about yourself  0  Trouble concentrating 0  Moving slowly or fidgety/restless 0  Suicidal thoughts 0  PHQ-9 Score 0  Difficult doing work/chores Not difficult at all      01/03/2024    1:53 PM 12/20/2022    8:33 AM 03/27/2021   12:53 PM  GAD 7 : Generalized Anxiety Score  Nervous, Anxious, on Edge 0 0 0  Control/stop worrying 0 0 0  Worry too much -  different things 0 0 0  Trouble relaxing 0 0 0  Restless 0 0 0  Easily annoyed or irritable 0 0 0  Afraid - awful might happen 0 0 0  Total GAD 7 Score 0 0 0  Anxiety Difficulty Not difficult at all Not difficult at all     Social History   Tobacco Use   Smoking status: Never   Smokeless tobacco: Never  Vaping Use   Vaping status: Never Used  Substance Use Topics   Alcohol use: Yes    Alcohol/week: 7.0 standard  drinks of alcohol    Types: 7 Glasses of wine per week   Drug use: No        Objective:   Physical Exam Vitals and nursing note reviewed.  Constitutional:      General: She is not in acute distress.    Appearance: She is well-developed.  Neck:     Thyroid : No thyromegaly.     Trachea: No tracheal deviation.     Comments: Thyroid  non tender to palpation. No mass or goiter noted.  Cardiovascular:     Rate and Rhythm: Normal rate and regular rhythm.     Heart sounds: Normal heart sounds. No murmur heard. Pulmonary:     Effort: Pulmonary effort is normal.     Breath sounds: Normal breath sounds.  Chest:  Breasts:    Right: No swelling, inverted nipple, mass, skin change or tenderness.     Left: No swelling, inverted nipple, mass, skin change or tenderness.  Abdominal:     General: There is no distension.     Palpations: Abdomen is soft.     Tenderness: There is no abdominal tenderness.  Genitourinary:    Comments: Defers GU exam. Denies any problems.  Musculoskeletal:     Cervical back: Normal range of motion and neck supple.  Lymphadenopathy:     Cervical: No cervical adenopathy.     Upper Body:     Right upper body: No supraclavicular, axillary or pectoral adenopathy.     Left upper body: No supraclavicular, axillary or pectoral adenopathy.  Skin:    General: Skin is warm and dry.  Neurological:     Mental Status: She is alert and oriented to person, place, and time.  Psychiatric:        Mood and Affect: Mood normal.        Behavior: Behavior normal.        Thought Content: Thought content normal.        Judgment: Judgment normal.    Today's Vitals   01/03/24 1337  BP: 121/79  Temp: 97.9 F (36.6 C)  SpO2: 97%  Weight: 159 lb (72.1 kg)  Height: 5' 4 (1.626 m)   Body mass index is 27.29 kg/m.   Results for orders placed or performed in visit on 11/22/23  CBC with Differential/Platelet   Collection Time: 12/05/23  9:44 AM  Result Value Ref Range   WBC  5.8 3.4 - 10.8 x10E3/uL   RBC 4.14 3.77 - 5.28 x10E6/uL   Hemoglobin 13.4 11.1 - 15.9 g/dL   Hematocrit 59.0 65.9 - 46.6 %   MCV 99 (H) 79 - 97 fL   MCH 32.4 26.6 - 33.0 pg   MCHC 32.8 31.5 - 35.7 g/dL   RDW 87.9 88.2 - 84.5 %   Platelets 257 150 - 450 x10E3/uL   Neutrophils 53 Not Estab. %   Lymphs 35 Not Estab. %   Monocytes 9 Not Estab. %  Eos 2 Not Estab. %   Basos 1 Not Estab. %   Neutrophils Absolute 3.1 1.4 - 7.0 x10E3/uL   Lymphocytes Absolute 2.0 0.7 - 3.1 x10E3/uL   Monocytes Absolute 0.5 0.1 - 0.9 x10E3/uL   EOS (ABSOLUTE) 0.1 0.0 - 0.4 x10E3/uL   Basophils Absolute 0.0 0.0 - 0.2 x10E3/uL   Immature Granulocytes 0 Not Estab. %   Immature Grans (Abs) 0.0 0.0 - 0.1 x10E3/uL  Comprehensive metabolic panel with GFR   Collection Time: 12/05/23  9:44 AM  Result Value Ref Range   Glucose 86 70 - 99 mg/dL   BUN 17 8 - 27 mg/dL   Creatinine, Ser 9.28 0.57 - 1.00 mg/dL   eGFR 95 >40 fO/fpw/8.26   BUN/Creatinine Ratio 24 12 - 28   Sodium 136 134 - 144 mmol/L   Potassium 4.5 3.5 - 5.2 mmol/L   Chloride 101 96 - 106 mmol/L   CO2 22 20 - 29 mmol/L   Calcium  9.5 8.7 - 10.3 mg/dL   Total Protein 6.7 6.0 - 8.5 g/dL   Albumin 4.7 3.9 - 4.9 g/dL   Globulin, Total 2.0 1.5 - 4.5 g/dL   Bilirubin Total 0.5 0.0 - 1.2 mg/dL   Alkaline Phosphatase 63 49 - 135 IU/L   AST 17 0 - 40 IU/L   ALT 17 0 - 32 IU/L  Hemoglobin A1c   Collection Time: 12/05/23  9:44 AM  Result Value Ref Range   Hgb A1c MFr Bld 5.9 (H) 4.8 - 5.6 %   Est. average glucose Bld gHb Est-mCnc 123 mg/dL  Lipid panel   Collection Time: 12/05/23  9:44 AM  Result Value Ref Range   Cholesterol, Total 166 100 - 199 mg/dL   Triglycerides 57 0 - 149 mg/dL   HDL 65 >60 mg/dL   VLDL Cholesterol Cal 11 5 - 40 mg/dL   LDL Chol Calc (NIH) 90 0 - 99 mg/dL   Chol/HDL Ratio 2.6 0.0 - 4.4 ratio  TSH   Collection Time: 12/05/23  9:44 AM  Result Value Ref Range   TSH 1.860 0.450 - 4.500 uIU/mL   Reviewed labs with patient  during visit.      Assessment & Plan:  1. Well woman exam (Primary) Routine wellness visit with 30-pound weight loss due to healthy habits. Blood pressure controlled. Screenings and vaccinations up to date. Pap smear current and negative. - Continue current healthy lifestyle and exercise regimen.  2. Hypothyroidism due to Hashimoto thyroiditis  - levothyroxine  (SYNTHROID ) 112 MCG tablet; Take 1 tablet (112 mcg total) by mouth daily.  Dispense: 90 tablet; Refill: 3  3. Hyperlipidemia, unspecified hyperlipidemia type  - rosuvastatin  (CRESTOR ) 10 MG tablet; Take 1 tablet (10 mg total) by mouth daily.  Dispense: 90 tablet; Refill: 3  4. Mild intermittent asthma without complication  - Budesonide  (PULMICORT  FLEXHALER) 90 MCG/ACT inhaler; INHALE 2 PUFFS BY MOUTH TWICE DAILY RINSE MOUTH AFTER USE  Dispense: 3 each; Refill: 3 - albuterol  (VENTOLIN  HFA) 108 (90 Base) MCG/ACT inhaler; INHALE 2 PUFFS INTO THE LUNGS EVERY 4 HOURS AS NEEDED FOR WHEEZING OR SHORTNESS OF BREATH.  Dispense: 54 g; Refill: 0  5. History of palpitations Blood pressure controlled on low dose metoprolol . Bradycardia present but asymptomatic. Pulse occasionally in 40s, not below 42. Discussed stopping metoprolol  if pulse drops too low, specifically below 45.   - metoprolol  succinate (TOPROL -XL) 25 MG 24 hr tablet; Take 1/2 (one-half) tablet by mouth once daily  Dispense: 45 tablet; Refill: 3  Return in about 1 year (around 01/02/2025) for physical.

## 2024-01-04 ENCOUNTER — Encounter: Payer: Self-pay | Admitting: Nurse Practitioner

## 2024-01-04 DIAGNOSIS — Z87898 Personal history of other specified conditions: Secondary | ICD-10-CM | POA: Insufficient documentation

## 2024-01-09 ENCOUNTER — Encounter: Payer: Self-pay | Admitting: Radiology

## 2024-01-12 ENCOUNTER — Other Ambulatory Visit: Payer: Self-pay

## 2024-01-12 DIAGNOSIS — M25562 Pain in left knee: Secondary | ICD-10-CM

## 2024-01-29 ENCOUNTER — Other Ambulatory Visit: Payer: Self-pay | Admitting: Nurse Practitioner

## 2024-01-29 DIAGNOSIS — J4520 Mild intermittent asthma, uncomplicated: Secondary | ICD-10-CM

## 2024-03-02 ENCOUNTER — Other Ambulatory Visit: Payer: Self-pay | Admitting: Nurse Practitioner

## 2024-03-02 DIAGNOSIS — J4520 Mild intermittent asthma, uncomplicated: Secondary | ICD-10-CM

## 2024-03-06 ENCOUNTER — Telehealth: Payer: Self-pay | Admitting: Orthopedic Surgery

## 2024-03-06 NOTE — Telephone Encounter (Signed)
 20 - 30 can you send in rx [facility] thx

## 2024-03-06 NOTE — Telephone Encounter (Signed)
 Pt is calling in wanting to know which leg compression stockings she should get for after her surgery on March 14, 2024.  Pt stated that Dr[facility] Addie [facility] be too tight for her.  Pt would like to have a call back or answer put on her MyChart.

## 2024-03-12 ENCOUNTER — Other Ambulatory Visit: Payer: Self-pay | Admitting: Surgical

## 2024-03-12 MED ORDER — OXYCODONE HCL 5 MG PO TABS: 5.0000 mg | ORAL_TABLET | ORAL | 0 refills | Status: AC | PRN

## 2024-03-12 MED ORDER — METHOCARBAMOL 500 MG PO TABS: 500.0000 mg | ORAL_TABLET | Freq: Three times a day (TID) | ORAL | 1 refills | Status: AC | PRN

## 2024-03-12 MED ORDER — ASPIRIN 81 MG PO CHEW: 81.0000 mg | CHEWABLE_TABLET | Freq: Two times a day (BID) | ORAL | 0 refills | Status: AC

## 2024-03-12 MED ORDER — CELECOXIB 100 MG PO CAPS: 100.0000 mg | ORAL_CAPSULE | Freq: Two times a day (BID) | ORAL | 0 refills | Status: AC

## 2024-03-14 ENCOUNTER — Telehealth: Payer: Self-pay | Admitting: Radiology

## 2024-03-14 ENCOUNTER — Ambulatory Visit (HOSPITAL_COMMUNITY)
Admission: RE | Admit: 2024-03-14 | Discharge: 2024-03-14 | Disposition: A | Discharge status: Home / Self Care | Source: Ambulatory Visit | Attending: Orthopedic Surgery | Admitting: Orthopedic Surgery

## 2024-03-14 ENCOUNTER — Encounter: Admitting: Orthopedic Surgery

## 2024-03-14 DIAGNOSIS — M25562 Pain in left knee: Secondary | ICD-10-CM | POA: Diagnosis present

## 2024-03-14 NOTE — Telephone Encounter (Signed)
 I called.

## 2024-03-14 NOTE — Telephone Encounter (Signed)
 [facility] called patient is NEGATIVE for DVT in Left leg. However, she said the leg was red and patient seems to be in a lot of pain with it. Please call patient to advise from here.

## 2024-03-19 ENCOUNTER — Ambulatory Visit: Admitting: Orthopedic Surgery

## 2024-03-19 ENCOUNTER — Encounter: Payer: Self-pay | Admitting: Orthopedic Surgery

## 2024-03-19 DIAGNOSIS — M25562 Pain in left knee: Secondary | ICD-10-CM

## 2024-03-19 NOTE — Progress Notes (Unsigned)
 "  Post-Op Visit Note   Patient: Amy Shelton           Date of Birth: August 11, 1960           MRN: [id number] Visit Date: 03/19/2024 PCP: Cook, Jayce G, DO   Assessment & Plan:  Chief Complaint:  Chief Complaint  Patient presents with   Left Knee - Routine Post Op    03/12/24 left knee scope   Visit Diagnoses:  1. Left knee pain, unspecified chronicity     Plan: Amy Shelton is now 1 week out left knee arthroscopy.  Patient has achieved 90 degrees of range of motion.  Ultrasound Doppler negative for DVT on postop day #2.  She has been able to ambulate off crutches within the household.  She is doing her exercises about 3 times a day.  Taking Celebrex  twice a day.  On exam she has range of motion easily to 90.  Has near full extension.  Mild effusion is present which is aspirated today.  Took out about 30 cc of fluid which is compatible with post arthroscopy effusion.  Plan at this time is to remove the sutures.  Continue with straight leg raises and leg extension exercises but also start with stationary bike.  Follow-up in 2 weeks for clinical recheck.  Follow-Up Instructions: No follow-ups on file.   Orders:  No orders of the defined types were placed in this encounter.  No orders of the defined types were placed in this encounter.   Imaging: No results found.  PMFS History: Patient Active Problem List   Diagnosis Date Noted   History of palpitations 01/04/2024   Family history of breast cancer in mother 01/03/2024   Breast density 03/27/2021   Vitamin D  insufficiency 07/27/2019   Mild intermittent asthma without complication 08/18/2018   Hyperlipidemia 08/18/2018   Prediabetes 08/18/2018   Impaired fasting glucose 08/18/2018   Medial meniscus, posterior horn derangement 12/13/2012   OA (osteoarthritis) of knee 12/13/2012   Left knee pain 12/13/2012   Hypothyroidism 07/07/2012   Past Medical History:  Diagnosis Date   Anemia    related to heavy vaginal bleeding-has  resolved   Asthma    Dysrhythmia    Hypertension    Hypothyroidism     Family History  Problem Relation Age of Onset   Hyperlipidemia Mother    Breast cancer Mother 25   Hyperlipidemia Father    Hypertension Father    Kidney disease Father    Colon cancer Maternal Aunt     Past Surgical History:  Procedure Laterality Date   BREAST CYST EXCISION Left 1991   fibroadenoma removed    COLONOSCOPY  02/05/2011   Procedure: COLONOSCOPY;  Surgeon: Margo CHRISTELLA Haddock, MD;  Location: [facility];  Service: Endoscopy;  Laterality: N/A;  9:30 AM   COLONOSCOPY N/A 05/31/2023   Procedure: COLONOSCOPY;  Surgeon: Cindie Carlin POUR, DO;  Location: [facility];  Service: Endoscopy;  Laterality: N/A;  2:00 pm, asa 2, pt knows to arrive at 11:30   POLYPECTOMY  05/31/2023   Procedure: POLYPECTOMY;  Surgeon: Cindie Carlin POUR, DO;  Location: [facility];  Service: Endoscopy;;   Social History   Occupational History   Not on file  Tobacco Use   Smoking status: Never   Smokeless tobacco: Never  Vaping Use   Vaping status: Never Used  Substance and Sexual Activity   Alcohol use: Yes    Alcohol/week: 7.0 standard drinks of alcohol    Types:  7 Glasses of wine per week   Drug use: No   Sexual activity: Yes    Birth control/protection: Surgical     "

## 2024-03-29 ENCOUNTER — Telehealth: Payer: Self-pay | Admitting: Orthopedic Surgery

## 2024-03-29 NOTE — Telephone Encounter (Signed)
 Pt called saying that they have an apt on Mon, but are concerned about the weather and wanting to reschedule later in the week. I tried to reschedule her, but the earliest I see for Dr. Addie or Herlene isn't until 2/3 and she is wondering if that is too far out. She also says that she can't come until 2/10. Call back number is [phone].

## 2024-03-29 NOTE — Telephone Encounter (Signed)
 Pt called back and said that she would take the apt on the 10th. If this is a problem, please call her back at [phone].

## 2024-04-02 ENCOUNTER — Telehealth: Payer: Self-pay

## 2024-04-02 ENCOUNTER — Encounter: Admitting: Orthopedic Surgery

## 2024-04-02 NOTE — Telephone Encounter (Signed)
 Prescription Request  04/02/2024  LOV: Visit date not found  What is the name of the medication or equipment? PULMICORT  FLEXHALER 90 MCG/ACT inhaler   Have you contacted your pharmacy to request a refill? Yes   Which pharmacy would you like this sent to?  [facility] [address], [place] - [address] [place] [address] Phone: [phone] Fax: [phone]    Patient notified that their request is being sent to the clinical staff for review and that they should receive a response within 2 business days.   Please advise at Mobile [phone] (mobile)

## 2024-04-03 ENCOUNTER — Other Ambulatory Visit: Payer: Self-pay

## 2024-04-03 DIAGNOSIS — J4520 Mild intermittent asthma, uncomplicated: Secondary | ICD-10-CM

## 2024-04-03 MED ORDER — PULMICORT FLEXHALER 90 MCG/ACT IN AEPB: INHALATION_SPRAY | RESPIRATORY_TRACT | 0 refills | Status: AC

## 2024-04-03 NOTE — Telephone Encounter (Signed)
 Sent refill in

## 2024-04-18 ENCOUNTER — Encounter: Admitting: Orthopedic Surgery
